# Patient Record
Sex: Female | Born: 1960 | Race: Black or African American | Hispanic: No | Marital: Single | Smoking: Current every day smoker
Health system: Southern US, Community
[De-identification: ages and names within clinical notes are randomized; demographics above are authoritative.]

## PROBLEM LIST (undated history)

## (undated) DIAGNOSIS — I679 Cerebrovascular disease, unspecified: Secondary | ICD-10-CM

## (undated) DIAGNOSIS — K219 Gastro-esophageal reflux disease without esophagitis: Secondary | ICD-10-CM

## (undated) DIAGNOSIS — F410 Panic disorder [episodic paroxysmal anxiety] without agoraphobia: Secondary | ICD-10-CM

## (undated) DIAGNOSIS — F329 Major depressive disorder, single episode, unspecified: Secondary | ICD-10-CM

## (undated) DIAGNOSIS — F17200 Nicotine dependence, unspecified, uncomplicated: Secondary | ICD-10-CM

## (undated) DIAGNOSIS — E669 Obesity, unspecified: Secondary | ICD-10-CM

## (undated) DIAGNOSIS — M549 Dorsalgia, unspecified: Secondary | ICD-10-CM

## (undated) DIAGNOSIS — R0600 Dyspnea, unspecified: Secondary | ICD-10-CM

## (undated) DIAGNOSIS — I10 Essential (primary) hypertension: Secondary | ICD-10-CM

## (undated) DIAGNOSIS — F319 Bipolar disorder, unspecified: Secondary | ICD-10-CM

## (undated) DIAGNOSIS — R002 Palpitations: Secondary | ICD-10-CM

## (undated) DIAGNOSIS — Z720 Tobacco use: Secondary | ICD-10-CM

## (undated) DIAGNOSIS — F32A Depression, unspecified: Secondary | ICD-10-CM

## (undated) DIAGNOSIS — E785 Hyperlipidemia, unspecified: Secondary | ICD-10-CM

## (undated) DIAGNOSIS — R079 Chest pain, unspecified: Secondary | ICD-10-CM

## (undated) HISTORY — DX: Hyperlipidemia, unspecified: E78.5

## (undated) HISTORY — DX: Essential (primary) hypertension: I10

## (undated) HISTORY — DX: Panic disorder (episodic paroxysmal anxiety): F41.0

## (undated) HISTORY — DX: Dyspnea, unspecified: R06.00

## (undated) HISTORY — DX: Chest pain, unspecified: R07.9

## (undated) HISTORY — DX: Dorsalgia, unspecified: M54.9

## (undated) HISTORY — DX: Major depressive disorder, single episode, unspecified: F32.9

## (undated) HISTORY — DX: Cerebrovascular disease, unspecified: I67.9

## (undated) HISTORY — DX: Palpitations: R00.2

## (undated) HISTORY — DX: Depression, unspecified: F32.A

## (undated) HISTORY — DX: Gastro-esophageal reflux disease without esophagitis: K21.9

## (undated) HISTORY — DX: Nicotine dependence, unspecified, uncomplicated: F17.200

## (undated) HISTORY — DX: Obesity, unspecified: E66.9

---

## 2000-12-01 HISTORY — PX: TOTAL ABDOMINAL HYSTERECTOMY: SHX209

## 2001-05-22 ENCOUNTER — Emergency Department (HOSPITAL_COMMUNITY): Admission: EM | Admit: 2001-05-22 | Discharge: 2001-05-22 | Payer: Self-pay | Admitting: Emergency Medicine

## 2002-01-16 ENCOUNTER — Emergency Department (HOSPITAL_COMMUNITY): Admission: EM | Admit: 2002-01-16 | Discharge: 2002-01-16 | Payer: Self-pay | Admitting: Emergency Medicine

## 2002-03-08 ENCOUNTER — Encounter (HOSPITAL_COMMUNITY): Admission: RE | Admit: 2002-03-08 | Discharge: 2002-04-07 | Payer: Self-pay | Admitting: Anesthesiology

## 2002-07-08 ENCOUNTER — Ambulatory Visit (HOSPITAL_COMMUNITY): Admission: RE | Admit: 2002-07-08 | Discharge: 2002-07-08 | Payer: Self-pay | Admitting: Family Medicine

## 2002-12-01 HISTORY — PX: CHOLECYSTECTOMY: SHX55

## 2003-06-06 ENCOUNTER — Encounter: Payer: Self-pay | Admitting: *Deleted

## 2003-06-06 ENCOUNTER — Emergency Department (HOSPITAL_COMMUNITY): Admission: EM | Admit: 2003-06-06 | Discharge: 2003-06-06 | Payer: Self-pay | Admitting: *Deleted

## 2003-08-24 ENCOUNTER — Ambulatory Visit (HOSPITAL_COMMUNITY): Admission: RE | Admit: 2003-08-24 | Discharge: 2003-08-24 | Payer: Self-pay | Admitting: Family Medicine

## 2003-08-24 ENCOUNTER — Encounter: Payer: Self-pay | Admitting: Family Medicine

## 2003-09-12 ENCOUNTER — Observation Stay (HOSPITAL_COMMUNITY): Admission: RE | Admit: 2003-09-12 | Discharge: 2003-09-13 | Payer: Self-pay | Admitting: General Surgery

## 2003-12-01 ENCOUNTER — Emergency Department (HOSPITAL_COMMUNITY): Admission: EM | Admit: 2003-12-01 | Discharge: 2003-12-01 | Payer: Self-pay | Admitting: Emergency Medicine

## 2004-10-15 ENCOUNTER — Ambulatory Visit: Payer: Self-pay | Admitting: Family Medicine

## 2004-11-22 ENCOUNTER — Ambulatory Visit: Payer: Self-pay | Admitting: Family Medicine

## 2004-12-27 ENCOUNTER — Ambulatory Visit: Payer: Self-pay | Admitting: Family Medicine

## 2005-01-27 ENCOUNTER — Ambulatory Visit: Payer: Self-pay | Admitting: Family Medicine

## 2005-02-25 ENCOUNTER — Ambulatory Visit: Payer: Self-pay | Admitting: Family Medicine

## 2005-03-31 ENCOUNTER — Ambulatory Visit: Payer: Self-pay | Admitting: Family Medicine

## 2005-05-01 ENCOUNTER — Ambulatory Visit: Payer: Self-pay | Admitting: Family Medicine

## 2005-05-05 ENCOUNTER — Ambulatory Visit (HOSPITAL_COMMUNITY): Admission: RE | Admit: 2005-05-05 | Discharge: 2005-05-05 | Payer: Self-pay | Admitting: Family Medicine

## 2005-06-04 ENCOUNTER — Ambulatory Visit: Payer: Self-pay | Admitting: Family Medicine

## 2005-07-31 ENCOUNTER — Ambulatory Visit: Payer: Self-pay | Admitting: Family Medicine

## 2005-10-30 ENCOUNTER — Ambulatory Visit: Payer: Self-pay | Admitting: Family Medicine

## 2005-12-02 ENCOUNTER — Ambulatory Visit: Payer: Self-pay | Admitting: Family Medicine

## 2005-12-31 ENCOUNTER — Ambulatory Visit: Payer: Self-pay | Admitting: Family Medicine

## 2006-01-13 ENCOUNTER — Ambulatory Visit: Payer: Self-pay | Admitting: Family Medicine

## 2006-01-15 ENCOUNTER — Ambulatory Visit (HOSPITAL_COMMUNITY): Admission: RE | Admit: 2006-01-15 | Discharge: 2006-01-15 | Payer: Self-pay | Admitting: Family Medicine

## 2006-02-25 ENCOUNTER — Ambulatory Visit: Payer: Self-pay | Admitting: Family Medicine

## 2006-03-31 ENCOUNTER — Ambulatory Visit: Payer: Self-pay | Admitting: Family Medicine

## 2006-07-02 ENCOUNTER — Ambulatory Visit: Payer: Self-pay | Admitting: Family Medicine

## 2006-07-30 ENCOUNTER — Ambulatory Visit: Payer: Self-pay | Admitting: Family Medicine

## 2006-09-03 ENCOUNTER — Ambulatory Visit: Payer: Self-pay | Admitting: Family Medicine

## 2006-10-09 ENCOUNTER — Ambulatory Visit: Payer: Self-pay | Admitting: Family Medicine

## 2006-12-01 DIAGNOSIS — R079 Chest pain, unspecified: Secondary | ICD-10-CM

## 2006-12-01 HISTORY — DX: Chest pain, unspecified: R07.9

## 2006-12-04 ENCOUNTER — Ambulatory Visit: Payer: Self-pay | Admitting: Family Medicine

## 2006-12-09 ENCOUNTER — Ambulatory Visit: Payer: Self-pay | Admitting: Cardiovascular Disease

## 2006-12-10 ENCOUNTER — Ambulatory Visit: Payer: Self-pay | Admitting: Cardiovascular Disease

## 2006-12-14 ENCOUNTER — Ambulatory Visit: Payer: Self-pay | Admitting: Cardiovascular Disease

## 2006-12-14 ENCOUNTER — Encounter (HOSPITAL_COMMUNITY): Admission: RE | Admit: 2006-12-14 | Discharge: 2007-01-13 | Payer: Self-pay | Admitting: Cardiovascular Disease

## 2007-01-08 ENCOUNTER — Ambulatory Visit: Payer: Self-pay | Admitting: Family Medicine

## 2007-02-03 ENCOUNTER — Ambulatory Visit: Payer: Self-pay | Admitting: Family Medicine

## 2007-02-03 LAB — CONVERTED CEMR LAB: Pap Smear: NORMAL

## 2007-03-08 ENCOUNTER — Encounter: Payer: Self-pay | Admitting: Family Medicine

## 2007-03-08 LAB — CONVERTED CEMR LAB
ALT: 24 units/L (ref 0–35)
AST: 19 units/L (ref 0–37)
Albumin: 4 g/dL (ref 3.5–5.2)
Alkaline Phosphatase: 113 units/L (ref 39–117)
BUN: 10 mg/dL (ref 6–23)
Bilirubin, Direct: <0.1 mg/dL (ref 0.0–0.3)
CO2: 24 meq/L (ref 19–32)
Calcium: 9.1 mg/dL (ref 8.4–10.5)
Chloride: 108 meq/L (ref 96–112)
Cholesterol: 243 mg/dL — ABNORMAL HIGH (ref 0–200)
Creatinine, Ser: 0.73 mg/dL (ref 0.40–1.20)
Glucose, Bld: 88 mg/dL (ref 70–99)
HDL: 32 mg/dL — ABNORMAL LOW (ref >39)
LDL Cholesterol: 143 mg/dL — ABNORMAL HIGH (ref 0–99)
Potassium: 4.5 meq/L (ref 3.5–5.3)
Sodium: 143 meq/L (ref 135–145)
TSH: 0.895 microintl units/mL (ref 0.350–5.50)
Total Bilirubin: 0.3 mg/dL (ref 0.3–1.2)
Total CHOL/HDL Ratio: 7.6
Total Protein: 7.1 g/dL (ref 6.0–8.3)
Triglycerides: 340 mg/dL — ABNORMAL HIGH (ref <150)
VLDL: 68 mg/dL — ABNORMAL HIGH (ref 0–40)

## 2007-03-10 ENCOUNTER — Ambulatory Visit: Payer: Self-pay | Admitting: Family Medicine

## 2007-04-07 ENCOUNTER — Ambulatory Visit: Payer: Self-pay | Admitting: Family Medicine

## 2007-05-06 ENCOUNTER — Ambulatory Visit: Payer: Self-pay | Admitting: Family Medicine

## 2007-06-07 ENCOUNTER — Ambulatory Visit: Payer: Self-pay | Admitting: Family Medicine

## 2007-08-09 ENCOUNTER — Ambulatory Visit: Payer: Self-pay | Admitting: Family Medicine

## 2007-10-08 ENCOUNTER — Ambulatory Visit: Payer: Self-pay | Admitting: Family Medicine

## 2007-11-30 ENCOUNTER — Ambulatory Visit: Payer: Self-pay | Admitting: Family Medicine

## 2007-12-02 ENCOUNTER — Encounter: Payer: Self-pay | Admitting: Family Medicine

## 2008-02-03 ENCOUNTER — Ambulatory Visit: Payer: Self-pay | Admitting: Family Medicine

## 2008-02-28 ENCOUNTER — Encounter: Payer: Self-pay | Admitting: Family Medicine

## 2008-02-28 LAB — CONVERTED CEMR LAB
BUN: 10 mg/dL (ref 6–23)
Basophils Absolute: 0.1 10*3/uL (ref 0.0–0.1)
Basophils Relative: 1 % (ref 0–1)
CO2: 19 meq/L (ref 19–32)
Calcium: 9.3 mg/dL (ref 8.4–10.5)
Chloride: 105 meq/L (ref 96–112)
Cholesterol: 269 mg/dL — ABNORMAL HIGH (ref 0–200)
Creatinine, Ser: 0.67 mg/dL (ref 0.40–1.20)
Eosinophils Absolute: 0.1 10*3/uL (ref 0.0–0.7)
Eosinophils Relative: 1 % (ref 0–5)
Glucose, Bld: 103 mg/dL — ABNORMAL HIGH (ref 70–99)
HCT: 43 % (ref 36.0–46.0)
HDL: 41 mg/dL (ref >39)
Hemoglobin: 15.2 g/dL — ABNORMAL HIGH (ref 12.0–15.0)
LDL Cholesterol: 180 mg/dL — ABNORMAL HIGH (ref 0–99)
Lymphocytes Relative: 25 % (ref 12–46)
Lymphs Abs: 1.8 10*3/uL (ref 0.7–4.0)
MCHC: 35.3 g/dL (ref 30.0–36.0)
MCV: 87.4 fL (ref 78.0–100.0)
Monocytes Absolute: 0.3 10*3/uL (ref 0.1–1.0)
Monocytes Relative: 4 % (ref 3–12)
Neutro Abs: 4.8 10*3/uL (ref 1.7–7.7)
Neutrophils Relative %: 69 % (ref 43–77)
Platelets: 242 10*3/uL (ref 150–400)
Potassium: 4.1 meq/L (ref 3.5–5.3)
RBC: 4.92 M/uL (ref 3.87–5.11)
RDW: 13.3 % (ref 11.5–15.5)
Sodium: 139 meq/L (ref 135–145)
TSH: 0.303 microintl units/mL — ABNORMAL LOW (ref 0.350–5.50)
Total CHOL/HDL Ratio: 6.6
Triglycerides: 242 mg/dL — ABNORMAL HIGH (ref <150)
VLDL: 48 mg/dL — ABNORMAL HIGH (ref 0–40)
WBC: 6.9 10*3/uL (ref 4.0–10.5)

## 2008-02-29 ENCOUNTER — Encounter: Payer: Self-pay | Admitting: Family Medicine

## 2008-02-29 LAB — CONVERTED CEMR LAB
ALT: 28 units/L (ref 0–35)
AST: 21 units/L (ref 0–37)
Albumin: 4.7 g/dL (ref 3.5–5.2)
Alkaline Phosphatase: 128 units/L — ABNORMAL HIGH (ref 39–117)
Bilirubin, Direct: <0.1 mg/dL (ref 0.0–0.3)
Total Bilirubin: 0.2 mg/dL — ABNORMAL LOW (ref 0.3–1.2)
Total Protein: 8.5 g/dL — ABNORMAL HIGH (ref 6.0–8.3)

## 2008-03-03 ENCOUNTER — Ambulatory Visit: Payer: Self-pay | Admitting: Family Medicine

## 2008-03-08 ENCOUNTER — Encounter: Payer: Self-pay | Admitting: Family Medicine

## 2008-03-08 DIAGNOSIS — F329 Major depressive disorder, single episode, unspecified: Secondary | ICD-10-CM

## 2008-03-08 DIAGNOSIS — I160 Hypertensive urgency: Secondary | ICD-10-CM | POA: Insufficient documentation

## 2008-03-08 DIAGNOSIS — F32A Depression, unspecified: Secondary | ICD-10-CM | POA: Insufficient documentation

## 2008-03-08 DIAGNOSIS — Z720 Tobacco use: Secondary | ICD-10-CM | POA: Insufficient documentation

## 2008-03-08 DIAGNOSIS — E785 Hyperlipidemia, unspecified: Secondary | ICD-10-CM

## 2008-03-08 DIAGNOSIS — F419 Anxiety disorder, unspecified: Secondary | ICD-10-CM | POA: Insufficient documentation

## 2008-03-08 DIAGNOSIS — F17200 Nicotine dependence, unspecified, uncomplicated: Secondary | ICD-10-CM

## 2008-03-08 DIAGNOSIS — I10 Essential (primary) hypertension: Secondary | ICD-10-CM | POA: Insufficient documentation

## 2008-03-08 DIAGNOSIS — E669 Obesity, unspecified: Secondary | ICD-10-CM

## 2008-06-30 ENCOUNTER — Telehealth: Payer: Self-pay | Admitting: Family Medicine

## 2008-08-03 ENCOUNTER — Ambulatory Visit: Payer: Self-pay | Admitting: Family Medicine

## 2008-08-08 ENCOUNTER — Encounter: Payer: Self-pay | Admitting: Family Medicine

## 2008-08-30 ENCOUNTER — Telehealth: Payer: Self-pay | Admitting: Family Medicine

## 2008-08-31 ENCOUNTER — Encounter: Payer: Self-pay | Admitting: Family Medicine

## 2008-09-28 ENCOUNTER — Telehealth: Payer: Self-pay | Admitting: Family Medicine

## 2008-09-29 ENCOUNTER — Encounter: Payer: Self-pay | Admitting: Family Medicine

## 2008-10-05 ENCOUNTER — Encounter: Payer: Self-pay | Admitting: Family Medicine

## 2008-10-20 ENCOUNTER — Encounter: Payer: Self-pay | Admitting: Family Medicine

## 2008-10-23 ENCOUNTER — Telehealth: Payer: Self-pay | Admitting: Family Medicine

## 2008-10-24 ENCOUNTER — Encounter: Payer: Self-pay | Admitting: Family Medicine

## 2008-11-13 ENCOUNTER — Encounter: Payer: Self-pay | Admitting: Family Medicine

## 2008-11-28 ENCOUNTER — Telehealth: Payer: Self-pay | Admitting: Family Medicine

## 2008-11-29 ENCOUNTER — Encounter: Payer: Self-pay | Admitting: Family Medicine

## 2008-12-29 ENCOUNTER — Ambulatory Visit: Payer: Self-pay | Admitting: Family Medicine

## 2009-01-24 ENCOUNTER — Encounter: Payer: Self-pay | Admitting: Family Medicine

## 2009-01-24 ENCOUNTER — Telehealth: Payer: Self-pay | Admitting: Family Medicine

## 2009-02-19 ENCOUNTER — Encounter: Payer: Self-pay | Admitting: Family Medicine

## 2009-02-23 ENCOUNTER — Encounter: Payer: Self-pay | Admitting: Family Medicine

## 2009-03-26 ENCOUNTER — Telehealth: Payer: Self-pay | Admitting: Family Medicine

## 2009-03-27 ENCOUNTER — Encounter: Payer: Self-pay | Admitting: Family Medicine

## 2009-04-26 ENCOUNTER — Encounter: Payer: Self-pay | Admitting: Family Medicine

## 2009-05-18 DIAGNOSIS — R0602 Shortness of breath: Secondary | ICD-10-CM | POA: Insufficient documentation

## 2009-05-18 DIAGNOSIS — R079 Chest pain, unspecified: Secondary | ICD-10-CM | POA: Insufficient documentation

## 2009-05-25 ENCOUNTER — Ambulatory Visit: Payer: Self-pay | Admitting: Family Medicine

## 2009-06-25 ENCOUNTER — Encounter: Payer: Self-pay | Admitting: Family Medicine

## 2009-07-26 ENCOUNTER — Telehealth: Payer: Self-pay | Admitting: Family Medicine

## 2009-08-23 ENCOUNTER — Encounter: Payer: Self-pay | Admitting: Family Medicine

## 2009-08-23 ENCOUNTER — Telehealth: Payer: Self-pay | Admitting: Family Medicine

## 2009-09-24 ENCOUNTER — Encounter: Payer: Self-pay | Admitting: Family Medicine

## 2009-10-31 ENCOUNTER — Encounter: Payer: Self-pay | Admitting: Family Medicine

## 2009-11-20 ENCOUNTER — Ambulatory Visit: Payer: Self-pay | Admitting: Family Medicine

## 2009-11-20 DIAGNOSIS — E049 Nontoxic goiter, unspecified: Secondary | ICD-10-CM | POA: Insufficient documentation

## 2009-11-27 ENCOUNTER — Telehealth: Payer: Self-pay | Admitting: Family Medicine

## 2009-12-19 ENCOUNTER — Encounter: Payer: Self-pay | Admitting: Family Medicine

## 2009-12-19 ENCOUNTER — Telehealth: Payer: Self-pay | Admitting: Family Medicine

## 2010-01-17 ENCOUNTER — Ambulatory Visit: Payer: Self-pay | Admitting: Family Medicine

## 2010-01-17 DIAGNOSIS — R0989 Other specified symptoms and signs involving the circulatory and respiratory systems: Secondary | ICD-10-CM

## 2010-02-05 ENCOUNTER — Telehealth: Payer: Self-pay | Admitting: Family Medicine

## 2010-02-12 ENCOUNTER — Encounter: Payer: Self-pay | Admitting: Family Medicine

## 2010-03-11 ENCOUNTER — Ambulatory Visit (HOSPITAL_COMMUNITY): Admission: RE | Admit: 2010-03-11 | Discharge: 2010-03-11 | Payer: Self-pay | Admitting: Family Medicine

## 2010-03-18 ENCOUNTER — Encounter: Payer: Self-pay | Admitting: Family Medicine

## 2010-04-16 ENCOUNTER — Ambulatory Visit: Payer: Self-pay | Admitting: Family Medicine

## 2010-05-15 ENCOUNTER — Encounter: Payer: Self-pay | Admitting: Family Medicine

## 2010-05-15 ENCOUNTER — Telehealth: Payer: Self-pay | Admitting: Family Medicine

## 2010-06-12 ENCOUNTER — Encounter: Payer: Self-pay | Admitting: Family Medicine

## 2010-08-09 ENCOUNTER — Telehealth: Payer: Self-pay | Admitting: Family Medicine

## 2010-08-12 ENCOUNTER — Encounter: Payer: Self-pay | Admitting: Family Medicine

## 2010-09-11 ENCOUNTER — Ambulatory Visit: Payer: Self-pay | Admitting: Family Medicine

## 2010-10-08 ENCOUNTER — Encounter: Payer: Self-pay | Admitting: Family Medicine

## 2010-10-09 ENCOUNTER — Telehealth: Payer: Self-pay | Admitting: Family Medicine

## 2010-10-09 ENCOUNTER — Encounter: Payer: Self-pay | Admitting: Family Medicine

## 2010-10-09 LAB — CONVERTED CEMR LAB
BUN: 10 mg/dL (ref 6–23)
Basophils Absolute: 0 10*3/uL (ref 0.0–0.1)
Basophils Relative: 0 % (ref 0–1)
CO2: 24 meq/L (ref 19–32)
Calcium: 9.7 mg/dL (ref 8.4–10.5)
Chloride: 106 meq/L (ref 96–112)
Cholesterol: 246 mg/dL — ABNORMAL HIGH (ref 0–200)
Creatinine, Ser: 0.76 mg/dL (ref 0.40–1.20)
Eosinophils Absolute: 0 10*3/uL (ref 0.0–0.7)
Eosinophils Relative: 0 % (ref 0–5)
Glucose, Bld: 123 mg/dL — ABNORMAL HIGH (ref 70–99)
HCT: 47.2 % — ABNORMAL HIGH (ref 36.0–46.0)
HDL: 46 mg/dL (ref >39)
Hemoglobin: 15.7 g/dL — ABNORMAL HIGH (ref 12.0–15.0)
LDL Cholesterol: 169 mg/dL — ABNORMAL HIGH (ref 0–99)
Lymphocytes Relative: 25 % (ref 12–46)
Lymphs Abs: 2 10*3/uL (ref 0.7–4.0)
MCHC: 33.3 g/dL (ref 30.0–36.0)
MCV: 92.5 fL (ref 78.0–100.0)
Monocytes Absolute: 0.3 10*3/uL (ref 0.1–1.0)
Monocytes Relative: 3 % (ref 3–12)
Neutro Abs: 5.8 10*3/uL (ref 1.7–7.7)
Neutrophils Relative %: 71 % (ref 43–77)
Platelets: 254 10*3/uL (ref 150–400)
Potassium: 3.8 meq/L (ref 3.5–5.3)
RBC: 5.1 M/uL (ref 3.87–5.11)
RDW: 13.6 % (ref 11.5–15.5)
Sodium: 143 meq/L (ref 135–145)
TSH: 0.253 microintl units/mL — ABNORMAL LOW (ref 0.350–4.500)
Total CHOL/HDL Ratio: 5.3
Triglycerides: 157 mg/dL — ABNORMAL HIGH (ref <150)
VLDL: 31 mg/dL (ref 0–40)
WBC: 8.1 10*3/uL (ref 4.0–10.5)

## 2010-10-10 ENCOUNTER — Encounter: Payer: Self-pay | Admitting: Family Medicine

## 2010-10-10 LAB — CONVERTED CEMR LAB
ALT: 15 units/L (ref 0–35)
AST: 16 units/L (ref 0–37)
Albumin: 4.7 g/dL (ref 3.5–5.2)
Alkaline Phosphatase: 113 units/L (ref 39–117)
Bilirubin, Direct: 0.1 mg/dL (ref 0.0–0.3)
Free T4: 0.98 ng/dL (ref 0.80–1.80)
Hgb A1c MFr Bld: 5.3 % (ref <5.7)
Indirect Bilirubin: 0.2 mg/dL (ref 0.0–0.9)
T3, Free: 2.8 pg/mL (ref 2.3–4.2)
Total Bilirubin: 0.3 mg/dL (ref 0.3–1.2)
Total Protein: 8.1 g/dL (ref 6.0–8.3)
Vit D, 25-Hydroxy: 21 ng/mL — ABNORMAL LOW (ref 30–89)

## 2010-10-14 ENCOUNTER — Encounter: Payer: Self-pay | Admitting: Family Medicine

## 2010-10-18 ENCOUNTER — Ambulatory Visit: Payer: Self-pay | Admitting: Family Medicine

## 2010-11-06 ENCOUNTER — Emergency Department (HOSPITAL_COMMUNITY)
Admission: EM | Admit: 2010-11-06 | Discharge: 2010-11-06 | Payer: Self-pay | Source: Home / Self Care | Admitting: Emergency Medicine

## 2010-11-07 ENCOUNTER — Telehealth: Payer: Self-pay | Admitting: Family Medicine

## 2010-11-07 ENCOUNTER — Telehealth (INDEPENDENT_AMBULATORY_CARE_PROVIDER_SITE_OTHER): Payer: Self-pay | Admitting: *Deleted

## 2010-11-07 ENCOUNTER — Encounter: Payer: Self-pay | Admitting: Family Medicine

## 2010-11-11 ENCOUNTER — Telehealth (INDEPENDENT_AMBULATORY_CARE_PROVIDER_SITE_OTHER): Payer: Self-pay | Admitting: *Deleted

## 2010-11-11 DIAGNOSIS — M5416 Radiculopathy, lumbar region: Secondary | ICD-10-CM | POA: Insufficient documentation

## 2010-11-11 DIAGNOSIS — IMO0002 Reserved for concepts with insufficient information to code with codable children: Secondary | ICD-10-CM

## 2010-11-19 ENCOUNTER — Ambulatory Visit (HOSPITAL_COMMUNITY)
Admission: RE | Admit: 2010-11-19 | Discharge: 2010-11-19 | Payer: Self-pay | Source: Home / Self Care | Attending: Family Medicine | Admitting: Family Medicine

## 2010-11-20 ENCOUNTER — Telehealth: Payer: Self-pay | Admitting: Family Medicine

## 2010-11-21 ENCOUNTER — Telehealth (INDEPENDENT_AMBULATORY_CARE_PROVIDER_SITE_OTHER): Payer: Self-pay | Admitting: *Deleted

## 2010-11-21 ENCOUNTER — Telehealth: Payer: Self-pay | Admitting: Family Medicine

## 2010-11-22 ENCOUNTER — Encounter: Payer: Self-pay | Admitting: Family Medicine

## 2010-11-26 ENCOUNTER — Telehealth: Payer: Self-pay | Admitting: Family Medicine

## 2010-12-06 ENCOUNTER — Encounter: Payer: Self-pay | Admitting: Family Medicine

## 2010-12-21 ENCOUNTER — Encounter: Payer: Self-pay | Admitting: Family Medicine

## 2010-12-22 ENCOUNTER — Encounter: Payer: Self-pay | Admitting: Family Medicine

## 2010-12-23 ENCOUNTER — Encounter: Payer: Self-pay | Admitting: Family Medicine

## 2010-12-25 ENCOUNTER — Telehealth: Payer: Self-pay | Admitting: Family Medicine

## 2010-12-31 NOTE — Medication Information (Signed)
Summary: Tax adviser   Imported By: Lind Guest 08/16/2010 12:54:02  _____________________________________________________________________  External Attachment:    Type:   Image     Comment:   External Document

## 2010-12-31 NOTE — Letter (Signed)
Summary: X RAYS  X RAYS   Imported By: Lind Guest 04/30/2010 16:49:12  _____________________________________________________________________  External Attachment:    Type:   Image     Comment:   External Document

## 2010-12-31 NOTE — Assessment & Plan Note (Signed)
Summary: OV   Vital Signs:  Patient profile:   50 year old female Menstrual status:  hysterectomy Height:      67 inches Weight:      218 pounds BMI:     34.27 O2 Sat:      97 % Pulse rate:   96 / minute Pulse rhythm:   regular Resp:     16 per minute BP sitting:   122 / 76  (left arm) Cuff size:   large  Vitals Entered By: Everitt Amber LPN (January 17, 2010 1:07 PM)  Nutrition Counseling: Patient's BMI is greater than 25 and therefore counseled on weight management options. CC: Follow up chronic problems, having bad anxiety   CC:  Follow up chronic problems and having bad anxiety.  History of Present Illness: c/op increased fatigue, palpitations, feels as though she will pass out, she hyperventilates, butterflies in her stomach. Has chest pain all the time states she takesalot of aspiri she is seeing the psychiatrist for panic and anxiety for uncontroled symptoms. she refuses an eKG and states  she does not want to go the carsdiologist at this time, states she is scared, she reports near syncopal episodes often and has questions about pAD.  Preventive Screening-Counseling & Management  Alcohol-Tobacco     Smoking Cessation Counseling: yes  Current Medications (verified): 1)  Lasix 20 Mg  Tabs (Furosemide) .... Take 1 Tablet By Mouth Once A Day 2)  Methadone Hcl 10 Mg  Tabs (Methadone Hcl) .... One Tab By Mouth Tid 3)  Crestor 20 Mg Tabs (Rosuvastatin Calcium) .... Take One Tab By Mouth At Bedtime 4)  Amlodipine Besylate 10 Mg Tabs (Amlodipine Besylate) .... Take One Tab By Mouth Once Daily 5)  Ativan 2 Mg Tabs (Lorazepam) .... One Tablet Half Hour Before Test, May Repeat After 15 Minutes If Needed  Allergies (verified): 1)  ! * Iv Dye  Review of Systems      See HPI General:  Complains of fatigue; denies chills and fever. ENT:  Complains of nasal congestion and postnasal drainage. CV:  Complains of chest pain or discomfort, palpitations, and shortness of breath with  exertion. Resp:  Complains of cough and sputum productive; brown sputum. GI:  Denies abdominal pain, constipation, diarrhea, nausea, and vomiting. GU:  Denies dysuria and urinary frequency. MS:  Complains of joint pain, low back pain, mid back pain, and muscle weakness. Psych:  Complains of easily tearful, mental problems, suicidal thoughts/plans, thoughts of violence, and unusual visions or sounds. Heme:  Denies abnormal bruising and bleeding. Allergy:  Denies hives or rash.  Physical Exam  General:  Well-developed,obese,in no acute distress; alert,appropriate and cooperative throughout examination. Anxious. HEENT: No facial asymmetry, goiter, and carotid bruit EOMI, No sinus tenderness, TM's Clear, oropharynx  pink and moist.   Chest: .Decreased throughout, few crackles CVS: S1, S2, No murmurs, No S3.   Abd: Soft, Nontender.  MS: decreased  ROM spine, hips, shoulders and knees.  Ext: No edema.   CNS: CN 2-12 intact, power tone and sensation normal throughout.   Skin: Intact, no visible lesions or rashes.  Psych: Good eye contact, normal affect.  Memory intact,    Impression & Recommendations:  Problem # 1:  CAROTID BRUIT (ICD-785.9) Assessment Comment Only  Future Orders: Radiology Referral (Radiology) ... 01/18/2010  Problem # 2:  GOITER, UNSPECIFIED (ICD-240.9) Assessment: Comment Only  Future Orders: Radiology Referral (Radiology) ... 01/18/2010  Problem # 3:  NICOTINE ADDICTION (ICD-305.1) Assessment: Unchanged  Encouraged smoking  cessation and discussed different methods for smoking cessation.   Problem # 4:  HYPERTENSION (ICD-401.9) Assessment: Improved  Her updated medication list for this problem includes:    Lasix 20 Mg Tabs (Furosemide) .Marland Kitchen... Take 1 tablet by mouth once a day    Amlodipine Besylate 10 Mg Tabs (Amlodipine besylate) .Marland Kitchen... Take one tab by mouth once daily  Orders: T-Basic Metabolic Panel [phone])  BP today: 122/76 Prior BP: 142/70  (11/20/2009)  Labs Reviewed: K+: 4.1 (02/28/2008) Creat: : 0.67 (02/28/2008)   Chol: 269 (02/28/2008)   HDL: 41 (02/28/2008)   LDL: 180 (02/28/2008)   TG: 242 (02/28/2008)  Problem # 5:  DEPRESSION (ICD-311) Assessment: Deteriorated  Her updated medication list for this problem includes:    Ativan 2 Mg Tabs (Lorazepam) ..... One tablet half hour before test, may repeat after 15 minutes if needed  Problem # 6:  BACK PAIN (ICD-724.5) Assessment: Unchanged  Her updated medication list for this problem includes:    Methadone Hcl 10 Mg Tabs (Methadone hcl) ..... One tab by mouth tid  Complete Medication List: 1)  Lasix 20 Mg Tabs (Furosemide) .... Take 1 tablet by mouth once a day 2)  Methadone Hcl 10 Mg Tabs (Methadone hcl) .... One tab by mouth tid 3)  Crestor 20 Mg Tabs (Rosuvastatin calcium) .... Take one tab by mouth at bedtime 4)  Amlodipine Besylate 10 Mg Tabs (Amlodipine besylate) .... Take one tab by mouth once daily 5)  Ativan 2 Mg Tabs (Lorazepam) .... One tablet half hour before test, may repeat after 15 minutes if needed  Other Orders: T-Lipid Profile ([id number]) T-CBC w/Diff ([id number]) T-TSH [phone])  Patient Instructions: 1)  Please schedule a follow-up appointment in 2 months. 2)  Tobacco is very bad for your health and your loved ones! You Should stop smoking!. 3)  Stop Smoking Tips: Choose a Quit date. Cut down before the Quit date. decide what you will do as a substitute when you feel the urge to smoke(gum,toothpick,exercise). 4)  It is important that you exercise regularly at least 20 minutes 5 times a week. If you develop chest pain, have severe difficulty breathing, or feel very tired , stop exercising immediately and seek medical attention. 5)  You need to lose weight. Consider a lower calorie diet and regular exercise.  6)  BMP prior to visit, ICD-9: 7)  Lipid Panel prior to visit, ICD-9:  fasting labs pls do bu this Saturday preferably 8)  TSH prior  to visit, ICD-9: 9)  you  will be referred for a thyroid ultrasound and carotid doppler study 10)  CBC w/ Diff prior to visit, ICD-9: Prescriptions: METHADONE HCL 10 MG  TABS (METHADONE HCL) one tab by mouth tid  #90 x 0   Entered by:   Everitt Amber LPN   Authorized by:   Syliva Overman MD   Signed by:   Syliva Overman MD on 01/17/2010   Method used:   Handwritten   RxID:   [id number] AMLODIPINE BESYLATE 10 MG TABS (AMLODIPINE BESYLATE) take one tab by mouth once daily  #30 Each x 5   Entered by:   Everitt Amber LPN   Authorized by:   Syliva Overman MD   Signed by:   Everitt Amber LPN on 66/44/0347   Method used:   Electronically to        [facility] (retail)       [address]  [place], [place]  [address]       Ph: [phone]       Fax: [phone]   RxID:   [id number]

## 2010-12-31 NOTE — Progress Notes (Signed)
Summary: speak nurse  Phone Note Call from Patient   Summary of Call: pt needs to speak with nurse about being stressed out about referrals and life in general. [phone] Initial call taken by: Rudene Anda,  February 05, 2010 11:24 AM  Follow-up for Phone Call        patient states she is stressed out, advised patient she is more than welcome to come in for ov with our PA to discuss her depression and anxiety. patient declines appt but states she will keep it in mind. Follow-up by: Adella Hare LPN,  February 05, 2010 11:44 AM

## 2010-12-31 NOTE — Miscellaneous (Signed)
Summary: NARC REFILL  Clinical Lists Changes  Medications: Rx of METHADONE HCL 10 MG  TABS (METHADONE HCL) one tab by mouth tid;  #90 x 0;  Signed;  Entered by: Everitt Amber LPN;  Authorized by: Syliva Overman MD;  Method used: Handwritten    Prescriptions: METHADONE HCL 10 MG  TABS (METHADONE HCL) one tab by mouth tid  #90 x 0   Entered by:   Everitt Amber LPN   Authorized by:   Syliva Overman MD   Signed by:   Everitt Amber LPN on 16/09/9603   Method used:   Handwritten   RxID:   [id number]

## 2010-12-31 NOTE — Progress Notes (Signed)
Summary: rx  Phone Note Call from Patient   Summary of Call: pt cancelled appt for today. said they found her friend dead. but stated she would be here to get rx on monday. pt did reschedule for friday of next week.  Initial call taken by: Rudene Anda,  August 09, 2010 8:04 AM  Follow-up for Phone Call        Noted Follow-up by: Everitt Amber LPN,  August 09, 2010 9:11 AM

## 2010-12-31 NOTE — Progress Notes (Signed)
Summary: MEDICATION  Phone Note Call from Patient   Summary of Call: PATIENT WANTED KNOW IF SHE COULD SEND SOMEONE ELSE TO PICK UP RX TODAY FOR METHODONE HCL 10 MG IF YOU NEED TO TALK WITH PATIENT PLEASE CALL [phone]. PATIENT REQUEST TO PUT DOB AND ADDRESS ON PERSCRIPTION WHEN PICKED UP  Initial call taken by: Eugenio Hoes,  November 07, 2010 8:15 AM  Follow-up for Phone Call        med was picked up Follow-up by: Adella Hare LPN,  November 07, 2010 9:00 AM

## 2010-12-31 NOTE — Letter (Signed)
Summary: misc  misc   Imported By: Lind Guest 04/30/2010 16:20:08  _____________________________________________________________________  External Attachment:    Type:   Image     Comment:   External Document

## 2010-12-31 NOTE — Miscellaneous (Signed)
Summary: narc refill  Clinical Lists Changes  Medications: Rx of METHADONE HCL 10 MG  TABS (METHADONE HCL) one tab by mouth tid;  #90 x 0;  Signed;  Entered by: Adella Hare LPN;  Authorized by: Syliva Overman MD;  Method used: Handwritten    Prescriptions: METHADONE HCL 10 MG  TABS (METHADONE HCL) one tab by mouth tid  #90 x 0   Entered by:   Adella Hare LPN   Authorized by:   Syliva Overman MD   Signed by:   Adella Hare LPN on 91/47/8295   Method used:   Handwritten   RxID:   [id number]   Appended Document: narc refill this was duplicated in error

## 2010-12-31 NOTE — Miscellaneous (Signed)
Summary: narc refill  Clinical Lists Changes  Medications: Rx of METHADONE HCL 10 MG  TABS (METHADONE HCL) one tab by mouth tid;  #90 x 0;  Signed;  Entered by: Adella Hare LPN;  Authorized by: Syliva Overman MD;  Method used: Handwritten    Prescriptions: METHADONE HCL 10 MG  TABS (METHADONE HCL) one tab by mouth tid  #90 x 0   Entered by:   Adella Hare LPN   Authorized by:   Syliva Overman MD   Signed by:   Adella Hare LPN on 16/09/9603   Method used:   Handwritten   RxID:   [id number]

## 2010-12-31 NOTE — Letter (Signed)
Summary: OFFICE NOTES  OFFICE NOTES   Imported By: Lind Guest 04/30/2010 16:52:40  _____________________________________________________________________  External Attachment:    Type:   Image     Comment:   External Document

## 2010-12-31 NOTE — Medication Information (Signed)
Summary: Tax adviser   Imported By: Lind Guest 03/20/2010 12:56:21  _____________________________________________________________________  External Attachment:    Type:   Image     Comment:   External Document

## 2010-12-31 NOTE — Letter (Signed)
Summary: LAB AD ON  LAB AD ON   Imported By: Lind Guest 10/10/2010 09:02:00  _____________________________________________________________________  External Attachment:    Type:   Image     Comment:   External Document

## 2010-12-31 NOTE — Medication Information (Signed)
Summary: Tax adviser   Imported By: Lind Guest 10/03/2010 10:50:44  _____________________________________________________________________  External Attachment:    Type:   Image     Comment:   External Document

## 2010-12-31 NOTE — Miscellaneous (Signed)
Summary: NARC REFILL  Clinical Lists Changes  Medications: Rx of METHADONE HCL 10 MG  TABS (METHADONE HCL) one tab by mouth tid;  #90 x 0;  Signed;  Entered by: Everitt Amber LPN;  Authorized by: Syliva Overman MD;  Method used: Handwritten    Prescriptions: METHADONE HCL 10 MG  TABS (METHADONE HCL) one tab by mouth tid  #90 x 0   Entered by:   Everitt Amber LPN   Authorized by:   Syliva Overman MD   Signed by:   Everitt Amber LPN on 62/13/0865   Method used:   Handwritten   RxID:   [id number]

## 2010-12-31 NOTE — Progress Notes (Signed)
  Phone Note Call from Patient   Caller: Patient Summary of Call: states whole left leg is hurting and some numbness was seen in ER yesterday, states she is in severe pain advised ER, but patient states they did not do any xrays or tests and she was there all day, only gave her pain meds wants to know whats wrong we have no openings, can we order x ray or should patient go to urgent care or ER  Initial call taken by: Adella Hare LPN,  November 07, 2010 2:13 PM  Follow-up for Phone Call        patient called back this morning complain in extreme leg pain and numbness in her toes, advised to go to ER Follow-up by: Adella Hare LPN,  November 08, 2010 8:37 AM

## 2010-12-31 NOTE — Medication Information (Signed)
Summary: Tax adviser   Imported By: Lind Guest 10/14/2010 08:37:28  _____________________________________________________________________  External Attachment:    Type:   Image     Comment:   External Document

## 2010-12-31 NOTE — Miscellaneous (Signed)
Summary: NARC REFILL  Clinical Lists Changes  Medications: Rx of METHADONE HCL 10 MG  TABS (METHADONE HCL) one tab by mouth tid;  #90 x 0;  Signed;  Entered by: Everitt Amber LPN;  Authorized by: Syliva Overman MD;  Method used: Handwritten    Prescriptions: METHADONE HCL 10 MG  TABS (METHADONE HCL) one tab by mouth tid  #90 x 0   Entered by:   Everitt Amber LPN   Authorized by:   Syliva Overman MD   Signed by:   Everitt Amber LPN on 81/19/1478   Method used:   Handwritten   RxID:   [id number]

## 2010-12-31 NOTE — Miscellaneous (Signed)
Summary: NARC REFILL  Clinical Lists Changes  Medications: Rx of METHADONE HCL 10 MG  TABS (METHADONE HCL) one tab by mouth tid;  #90 x 0;  Signed;  Entered by: Everitt Amber LPN;  Authorized by: Syliva Overman MD;  Method used: Handwritten    Prescriptions: METHADONE HCL 10 MG  TABS (METHADONE HCL) one tab by mouth tid  #90 x 0   Entered by:   Everitt Amber LPN   Authorized by:   Syliva Overman MD   Signed by:   Everitt Amber LPN on 53/66/4403   Method used:   Handwritten   RxID:   [id number]

## 2010-12-31 NOTE — Medication Information (Signed)
Summary: Tax adviser   Imported By: Lind Guest 11/07/2010 12:01:34  _____________________________________________________________________  External Attachment:    Type:   Image     Comment:   External Document

## 2010-12-31 NOTE — Progress Notes (Signed)
Summary: written rx  Phone Note Call from Patient   Summary of Call: wants to come up here tomorrow and pick up a written rx. [phone] Initial call taken by: Rudene Anda,  December 19, 2009 2:26 PM  Follow-up for Phone Call        Phone Call Completed Follow-up by: Worthy Keeler LPN,  December 19, 2009 2:58 PM

## 2010-12-31 NOTE — Letter (Signed)
Summary: HISTORY AND PHYSICAL  HISTORY AND PHYSICAL   Imported By: Lind Guest 04/30/2010 16:48:44  _____________________________________________________________________  External Attachment:    Type:   Image     Comment:   External Document

## 2010-12-31 NOTE — Miscellaneous (Signed)
Summary: NARC REFILL  Clinical Lists Changes  Medications: Rx of METHADONE HCL 10 MG  TABS (METHADONE HCL) one tab by mouth tid;  #90 x 0;  Signed;  Entered by: Everitt Amber;  Authorized by: Syliva Overman MD;  Method used: Handwritten    Prescriptions: METHADONE HCL 10 MG  TABS (METHADONE HCL) one tab by mouth tid  #90 x 0   Entered by:   Everitt Amber   Authorized by:   Syliva Overman MD   Signed by:   Everitt Amber on 12/19/2009   Method used:   Handwritten   RxID:   [id number]

## 2010-12-31 NOTE — Assessment & Plan Note (Signed)
Summary: follow up- results- room 1   Vital Signs:  Patient profile:   50 year old female Menstrual status:  hysterectomy Height:      67 inches Weight:      209 pounds BMI:     32.85 O2 Sat:      99 % on Room air Pulse rate:   94 / minute Resp:     16 per minute BP sitting:   160 / 90  (left arm)  Vitals Entered By: Adella Hare LPN (Apr 16, 2010 10:49 AM) CC: follow up/ results Is Patient Diabetic? No Comments did not bring meds to ov   CC:  follow up/ results.  History of Present Illness: Pt is here today for refill of her BP meds and Methadone.  She states she suffers with "bad depression and anxiety."  She does see a psychiatrist.  States no change in her mood, just ongoing issue.  Does get heart racing.  when she takes her medications it goes away.  Seems to come when her anxiety is worse.  She admits to suicidal thoughts off & on, but no plans.  She saw her psychiatrist 2 weeks ago.  Has prescription for new medicine but hasn't filled it yet. Plans to pick this up today & start it.  Chronic back pain.  No change.  Chronic pain in bilat LE.  Also Chronic tingling "pins and needles" in her fingers.  Throid goiter.  Recently had thyroid US.  Pt has not had labs yet that were ordered in Feb.   Allergies: 1)  ! * Iv Dye  Past History:  Past medical history reviewed for relevance to current acute and chronic problems.  Past Medical History: Reviewed history from 05/18/2009 and no changes required. CHEST PAIN-UNSPECIFIED (ICD-786.50) normal echo and myovue 12/2006 DYSPNEA (ICD-786.05) NICOTINE ADDICTION (ICD-305.1) BACK PAIN (ICD-724.5) DEPRESSION (ICD-311) OBESITY (ICD-278.00) HYPERLIPIDEMIA (ICD-272.4) HYPERTENSION (ICD-401.9) Panic attacks  Review of Systems General:  Complains of fatigue. CV:  Complains of chest pain or discomfort and palpitations. Resp:  Complains of shortness of breath; denies cough. MS:  Complains of joint pain, low back pain, mid back  pain, muscle aches, and muscle weakness; denies joint swelling. Neuro:  Complains of headaches, numbness, and tremors. Psych:  Complains of anxiety, depression, easily tearful, mental problems, panic attacks, and suicidal thoughts/plans; denies thoughts of violence.  Physical Exam  General:  Well-developed,well-nourished,in no acute distress; alert,appropriate and cooperative throughout examination Head:  Normocephalic and atraumatic without obvious abnormalities. No apparent alopecia or balding. Ears:  External ear exam shows no significant lesions or deformities.  Otoscopic examination reveals clear canals, tympanic membranes are intact bilaterally without bulging, retraction, inflammation or discharge. Hearing is grossly normal bilaterally. Nose:  External nasal examination shows no deformity or inflammation. Nasal mucosa are pink and moist without lesions or exudates. Mouth:  Oral mucosa and oropharynx without lesions or exudates.  Neck:  No deformities, masses, or tenderness noted. Lungs:  Normal respiratory effort, chest expands symmetrically. Lungs are clear to auscultation, no crackles or wheezes. Heart:  Normal rate and regular rhythm. S1 and S2 normal without gallop, murmur, click, rub or other extra sounds. Msk:  Pt reports tenderness to palp entire thoracic and lumbar back. Pulses:  R radial normal and L radial normal.   Extremities:  No PTE. Neurologic:  alert & oriented X3, strength normal in all extremities, gait normal, and DTRs symmetrical and normal.   Cervical Nodes:  No lymphadenopathy noted Psych:  Oriented X3, good eye  contact, depressed affect, tearful, and moderately anxious.     Impression & Recommendations:  Problem # 1:  BACK PAIN (ICD-724.5) Assessment Unchanged Chronic.  No change.  Her updated medication list for this problem includes:    Methadone Hcl 10 Mg Tabs (Methadone hcl) ..... One tab by mouth tid  Problem # 2:  DEPRESSION (ICD-311) Assessment:  Unchanged Pt to continue f/u with psychiatrist. Encouraged pt to start new med prescribed by psych.  Her updated medication list for this problem includes:    Ativan 2 Mg Tabs (Lorazepam) ..... One tablet half hour before test, may repeat after 15 minutes if needed  Problem # 3:  GOITER, UNSPECIFIED (ICD-240.9) Assessment: Comment Only Encouraged pt to have labs done as prev ordered.  Problem # 4:  HYPERTENSION (ICD-401.9) Assessment: Deteriorated  Her updated medication list for this problem includes:    Lasix 20 Mg Tabs (Furosemide) .Marland Kitchen... Take 1 tablet by mouth once a day    Amlodipine Besylate 10 Mg Tabs (Amlodipine besylate) .Marland Kitchen... Take one tab by mouth once daily  BP today: 160/90 Prior BP: 122/76 (01/17/2010)  Labs Reviewed: K+: 4.1 (02/28/2008) Creat: : 0.67 (02/28/2008)   Chol: 269 (02/28/2008)   HDL: 41 (02/28/2008)   LDL: 180 (02/28/2008)   TG: 242 (02/28/2008)  Complete Medication List: 1)  Lasix 20 Mg Tabs (Furosemide) .... Take 1 tablet by mouth once a day 2)  Methadone Hcl 10 Mg Tabs (Methadone hcl) .... One tab by mouth tid 3)  Crestor 20 Mg Tabs (Rosuvastatin calcium) .... Take one tab by mouth at bedtime 4)  Amlodipine Besylate 10 Mg Tabs (Amlodipine besylate) .... Take one tab by mouth once daily 5)  Ativan 2 Mg Tabs (Lorazepam) .... One tablet half hour before test, may repeat after 15 minutes if needed  Patient Instructions: 1)  Please schedule a follow-up appointment in 2 months. 2)  Tobacco is very bad for your health and your loved ones! You Should stop smoking!. 3)  Stop Smoking Tips: Choose a Quit date. Cut down before the Quit date. decide what you will do as a substitute when you feel the urge to smoke(gum,toothpick,exercise). 4)  Continue seeing your psychiatrist. 5)  Please get your lab work done soon! Prescriptions: AMLODIPINE BESYLATE 10 MG TABS (AMLODIPINE BESYLATE) take one tab by mouth once daily  #30 Each x 5   Entered and Authorized by:    Esperanza Sheets PA   Signed by:   Esperanza Sheets PA on 04/16/2010   Method used:   Electronically to        [facility] (retail)       [address]       [place], [place]  [address]       Ph: [phone]       Fax: [phone]   RxID:   [id number]

## 2010-12-31 NOTE — Letter (Signed)
Summary: misc  misc   Imported By: Lind Guest 04/30/2010 16:22:30  _____________________________________________________________________  External Attachment:    Type:   Image     Comment:   External Document

## 2010-12-31 NOTE — Letter (Signed)
Summary: CONSULTS  CONSULTS   Imported By: Lind Guest 04/30/2010 16:47:45  _____________________________________________________________________  External Attachment:    Type:   Image     Comment:   External Document

## 2010-12-31 NOTE — Progress Notes (Signed)
Summary: rx  Phone Note Call from Patient   Summary of Call: pt wants to know if pain med rx is ready for her to pick up. [phone] Initial call taken by: Rudene Anda,  May 15, 2010 10:42 AM  Follow-up for Phone Call        patient aware Follow-up by: Adella Hare LPN,  May 15, 2010 10:52 AM

## 2010-12-31 NOTE — Letter (Signed)
Summary: demo  demo   Imported By: Lind Guest 04/30/2010 16:19:39  _____________________________________________________________________  External Attachment:    Type:   Image     Comment:   External Document

## 2010-12-31 NOTE — Assessment & Plan Note (Signed)
Summary: office visit   Vital Signs:  Patient profile:   50 year old female Menstrual status:  hysterectomy Height:      67 inches Weight:      199.75 pounds BMI:     31.40 O2 Sat:      99 % Pulse rate:   108 / minute Resp:     18 per minute BP sitting:   138 / 84  (left arm) Cuff size:   large  Vitals Entered By: Everitt Amber LPN  CC: Follow up visit   CC:  Follow up visit.  History of Present Illness: Pt presents today for check up. See's psychiatrist for depression, anxiety and panic attacks.  Pt states her anxiety is still bad.  She doesnt like leaving the house.    Pt has not had labs drawn that were ordered in Feb.  Needs thyoid labs to f/u thyroid goiter.  [place] 4-11 showed small nodules.  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 Pt states she is trying to cut back on smoking.  Is down to approx 6 cigs per day.     Current Medications (verified): 1)  Methadone Hcl 10 Mg  Tabs (Methadone Hcl) .... One Tab By Mouth Tid 2)  Amlodipine Besylate 10 Mg Tabs (Amlodipine Besylate) .... Take One Tab By Mouth Once Daily  Allergies (verified): 1)  ! * Iv Dye  Past History:  Past medical history reviewed for relevance to current acute and chronic problems.  Past Medical History: Reviewed history from 05/18/2009 and no changes required. CHEST PAIN-UNSPECIFIED (ICD-786.50) normal echo and myovue 12/2006 DYSPNEA (ICD-786.05) NICOTINE ADDICTION (ICD-305.1) BACK PAIN (ICD-724.5) DEPRESSION (ICD-311) OBESITY (ICD-278.00) HYPERLIPIDEMIA (ICD-272.4) HYPERTENSION (ICD-401.9) Panic attacks  Review of Systems General:  Denies chills and fever. ENT:  Denies earache, nasal congestion, and sore throat. CV:  Complains of palpitations; denies chest pain or discomfort; HEART RACING WITH ANXIETY. Resp:  Denies cough and shortness of breath. MS:  Complains of low back pain. Psych:  Complains of anxiety, depression, easily tearful, mental problems, and panic  attacks.  Physical Exam  General:  alert, well-developed, well-nourished, well-hydrated, and appropriate dress.   Head:  Normocephalic and atraumatic without obvious abnormalities. No apparent alopecia or balding. Ears:  External ear exam shows no significant lesions or deformities.  Otoscopic examination reveals clear canals, tympanic membranes are intact bilaterally without bulging, retraction, inflammation or discharge. Hearing is grossly normal bilaterally. Nose:  External nasal examination shows no deformity or inflammation. Nasal mucosa are pink and moist without lesions or exudates. Mouth:  Oral mucosa and oropharynx without lesions or exudates.  Neck:  No deformities, masses, or tenderness noted. Lungs:  Normal respiratory effort, chest expands symmetrically. Lungs are clear to auscultation, no crackles or wheezes. Heart:  Normal rate and regular rhythm. S1 and S2 normal without gallop, murmur, click, rub or other extra sounds. Cervical Nodes:  No lymphadenopathy noted Psych:  memory intact for recent and remote and moderately anxious.     Impression & Recommendations:  Problem # 1:  BACK PAIN (ICD-724.5) Assessment Comment Only  Her updated medication list for this problem includes:    Methadone Hcl 10 Mg Tabs (Methadone hcl) ..... One tab by mouth tid  Problem # 2:  GOITER, UNSPECIFIED (ICD-240.9) Assessment: Comment Only Again encouraged pt to have labs drawn.  She was given another copy of her lab order.  Problem # 3:  HYPERTENSION (ICD-401.9) Assessment: Improved  The following medications were removed from the medication  list:    Lasix 20 Mg Tabs (Furosemide) .Marland Kitchen... Take 1 tablet by mouth once a day Her updated medication list for this problem includes:    Amlodipine Besylate 10 Mg Tabs (Amlodipine besylate) .Marland Kitchen... Take one tab by mouth once daily  BP today: 138/84 Prior BP: 160/90 (04/16/2010)  Labs Reviewed: K+: 4.1 (02/28/2008) Creat: : 0.67 (02/28/2008)    Chol: 269 (02/28/2008)   HDL: 41 (02/28/2008)   LDL: 180 (02/28/2008)   TG: 242 (02/28/2008)  Problem # 4:  DEPRESSION (ICD-311) Assessment: Comment Only To continue f/u with psychiatrist.  The following medications were removed from the medication list:    Ativan 2 Mg Tabs (Lorazepam) ..... One tablet half hour before test, may repeat after 15 minutes if needed Her updated medication list for this problem includes:    Citalopram Hydrobromide 40 Mg Tabs (Citalopram hydrobromide) .Marland Kitchen... Take one two times a day    Alprazolam 2 Mg Tabs (Alprazolam) .Marland Kitchen... Take one tab three times a day  Complete Medication List: 1)  Methadone Hcl 10 Mg Tabs (Methadone hcl) .... One tab by mouth tid 2)  Amlodipine Besylate 10 Mg Tabs (Amlodipine besylate) .... Take one tab by mouth once daily 3)  Citalopram Hydrobromide 40 Mg Tabs (Citalopram hydrobromide) .... Take one two times a day 4)  Abilify 30 Mg Tabs (Aripiprazole) .... Take one every morning 5)  Lithium Carbonate 450 Mg Cr-tabs (Lithium carbonate) .... Take one at bedtime 6)  Alprazolam 2 Mg Tabs (Alprazolam) .... Take one tab three times a day  Patient Instructions: 1)  Please schedule a follow-up appointment in 1 month. 2)  Have blood work drawn fasting. 3)  Continue seeing your psychiatrist. Prescriptions: METHADONE HCL 10 MG  TABS (METHADONE HCL) one tab by mouth tid  #90 x 0   Entered by:   Everitt Amber LPN   Authorized by:   Syliva Overman MD   Signed by:   Everitt Amber LPN on 81/19/1478   Method used:   Printed then faxed to ...       [facility] (retail)       [address]       [place], [place]  [address]       Ph: [phone]       Fax: [phone]   RxID:   [id number]  Pt refused flu vaccine today.

## 2010-12-31 NOTE — Progress Notes (Signed)
  Phone Note Call from Patient   Caller: Patient Summary of Call: wants pain med states appt is the 18th and had bloodwork as you requested Initial call taken by: Adella Hare LPN,  October 09, 2010 11:51 AM  Follow-up for Phone Call        I noted the blood work, ps explain she needs to keep her appt since the labs have to be addressed, I will have a script ready for her this pm Follow-up by: Syliva Overman MD,  October 09, 2010 12:31 PM  Additional Follow-up for Phone Call Additional follow up Details #1::        Phone Call Completed Additional Follow-up by: Adella Hare LPN,  October 09, 2010 1:11 PM

## 2010-12-31 NOTE — Assessment & Plan Note (Signed)
Summary: F UP   Vital Signs:  Patient profile:   50 year old female Menstrual status:  hysterectomy Height:      67 inches Weight:      208.50 pounds BMI:     32.77 O2 Sat:      90 % on Room air Pulse rate:   80 / minute Pulse rhythm:   regular Resp:     16 per minute BP sitting:   130 / 70  (left arm)  Vitals Entered By: Adella Hare LPN (October 18, 2010 11:15 AM)  Nutrition Counseling: Patient's BMI is greater than 25 and therefore counseled on weight management options.  O2 Flow:  Room air CC: follow-up visit Is Patient Diabetic? No   CC:  follow-up visit.  History of Present Illness: Reports  that thshe has not been doing well. Anxiety, depression, panic disorder are all uncontrolled.she experiences palpitations, social withdrawal and does not wantto be around people.she sees her psychiatrist regularly. Her nicotineuse isworse. Denies recent fever or chills. Denies sinus pressure, nasal congestion , ear pain or sore throat. Denies chest congestion, or cough productive of sputum. Denies chest pain, , PND, orthopnea or leg swelling. Denies abdominal pain, nausea, vomitting, diarrhea or constipation. Denies change in bowel movements or bloody stool. Denies dysuria , frequency, incontinence or hesitancy. Denies  joint pain, swelling, or reduced mobility. Denies headaches, vertigo, seizures.  Denies  rash, lesions, or itch.     Preventive Screening-Counseling & Management  Alcohol-Tobacco     Smoking Cessation Counseling: yes  Current Medications (verified): 1)  Methadone Hcl 10 Mg  Tabs (Methadone Hcl) .... One Tab By Mouth Tid 2)  Amlodipine Besylate 10 Mg Tabs (Amlodipine Besylate) .... Take One Tab By Mouth Once Daily 3)  Lithium Carbonate 450 Mg Cr-Tabs (Lithium Carbonate) .... Take One At Bedtime 4)  Alprazolam 2 Mg Tabs (Alprazolam) .... Take One Tab Three Times A Day 5)  Ambien 10 Mg Tabs (Zolpidem Tartrate) .... One Tab By Mouth At Bedtime As  Needed 6)  Viibryd 40 Mg Tabs (Vilazodone Hcl) .... One Tab By Mouth Once Daily  Allergies (verified): 1)  ! * Iv Dye  Review of Systems      See HPI General:  Complains of fatigue, malaise, and weakness. Eyes:  Denies discharge and red eye. CV:  Complains of palpitations. MS:  Complains of low back pain, mid back pain, and thoracic pain. Psych:  Complains of anxiety, depression, easily angered, easily tearful, irritability, mental problems, and unusual visions or sounds; denies suicidal thoughts/plans and thoughts of violence. Endo:  Denies cold intolerance, excessive hunger, excessive thirst, and excessive urination. Heme:  Denies abnormal bruising and bleeding. Allergy:  Denies hives or rash.  Physical Exam  General:  Well-developedobesein no acute distress; alert,appropriate and cooperative throughout examination HEENT: No facial asymmetry,  EOMI, No sinus tenderness, TM's Clear, oropharynx  pink and moist.   Chest: Clear to auscultation bilaterally. Decreased air entry bilaterally CVS: S1, S2, No murmurs, No S3.   Abd: Soft, Nontender.  MS: decreased ROM spine, hips, shoulders and knees.  Ext: No edema.   CNS: CN 2-12 intact, power tone and sensation normal throughout.   Skin: Intact, no visible lesions or rashes.  Psych: Good eye contact, normal affect.  Memory loss, both depressed and anxious appearing    Impression & Recommendations:  Problem # 1:  NICOTINE ADDICTION (ICD-305.1) Assessment Deteriorated  Encouraged smoking cessation and discussed different methods for smoking cessation.   Problem # 2:  DEPRESSION (ICD-311) Assessment: Unchanged  The following medications were removed from the medication list:    Citalopram Hydrobromide 40 Mg Tabs (Citalopram hydrobromide) .Marland Kitchen... Take one two times a day Her updated medication list for this problem includes:    Alprazolam 2 Mg Tabs (Alprazolam) .Marland Kitchen... Take one tab three times a day    Viibryd 40 Mg Tabs (Vilazodone  hcl) ..... One tab by mouth once daily  Problem # 3:  OBESITY (ICD-278.00) Assessment: Unchanged  Ht: 67 (10/18/2010)   Wt: 208.50 (10/18/2010)   BMI: 32.77 (10/18/2010) therapeutic lifestyle change discussed and encouraged  Problem # 4:  BACK PAIN (ICD-724.5) Assessment: Unchanged  Her updated medication list for this problem includes:    Methadone Hcl 10 Mg Tabs (Methadone hcl) ..... One tab by mouth tid  Problem # 5:  HYPERTENSION (ICD-401.9) Assessment: Unchanged  Her updated medication list for this problem includes:    Amlodipine Besylate 10 Mg Tabs (Amlodipine besylate) .Marland Kitchen... Take one tab by mouth once daily  Orders: T-Basic Metabolic Panel [phone])  BP today: 130/70 Prior BP: 138/84 (09/11/2010)  Labs Reviewed: K+: 3.8 (10/08/2010) Creat: : 0.76 (10/08/2010)   Chol: 246 (10/08/2010)   HDL: 46 (10/08/2010)   LDL: 169 (10/08/2010)   TG: 157 (10/08/2010)  Problem # 6:  HYPERLIPIDEMIA (ICD-272.4) Assessment: Improved  Her updated medication list for this problem includes:    Lovastatin 40 Mg Tabs (Lovastatin) .Marland Kitchen..Marland Kitchen Two tablets at bedtime  Orders: Medicare Electronic Prescription [id number]) T-Lipid Profile [phone]) T-Hepatic Function [phone])  Labs Reviewed: SGOT: 16 (10/09/2010)   SGPT: 15 (10/09/2010)   HDL:46 (10/08/2010), 41 (02/28/2008)  LDL:169 (10/08/2010), 180 (02/28/2008)  Chol:246 (10/08/2010), 269 (02/28/2008)  Trig:157 (10/08/2010), 242 (02/28/2008)  Complete Medication List: 1)  Methadone Hcl 10 Mg Tabs (Methadone hcl) .... One tab by mouth tid 2)  Amlodipine Besylate 10 Mg Tabs (Amlodipine besylate) .... Take one tab by mouth once daily 3)  Lithium Carbonate 450 Mg Cr-tabs (Lithium carbonate) .... Take one at bedtime 4)  Alprazolam 2 Mg Tabs (Alprazolam) .... Take one tab three times a day 5)  Ambien 10 Mg Tabs (Zolpidem tartrate) .... One tab by mouth at bedtime as needed 6)  Viibryd 40 Mg Tabs (Vilazodone hcl) .... One tab by mouth  once daily 7)  Lovastatin 40 Mg Tabs (Lovastatin) .... Two tablets at bedtime  Other Orders: T- Hemoglobin A1C ([id number])  Patient Instructions: 1)  Please schedule a follow-up appointment in 3 months. 2)  Tobacco is very bad for your health and your loved ones! You Should stop smoking!. 3)  Stop Smoking Tips: Choose a Quit date. Cut down before the Quit date. decide what you will do as a substitute when you feel the urge to smoke(gum,toothpick,exercise). 4)  You need to lose weight. Consider a lower calorie diet and regular exercise.  5)  BMP prior to visit, ICD-9: 6)  Hepatic Panel prior to visit, ICD-9: 7)  Lipid Panel prior to visit, ICD-9:  fasting in 3 months 8)  HbgA1C prior to visit, ICD-9: 9)  Schedule your mammogram. Prescriptions: LOVASTATIN 40 MG TABS (LOVASTATIN) two tablets at bedtime  #60 x 3   Entered and Authorized by:   Syliva Overman MD   Signed by:   Syliva Overman MD on 10/18/2010   Method used:   Electronically to        [facility] (retail)       [address]  [place], [place]  [address]       Ph: [phone]       Fax: [phone]   RxID:   [id number]    Orders Added: 1)  Est. Patient Level IV [38756] 2)  Medicare Electronic Prescription [G8553] 3)  T-Basic Metabolic Panel [[id number]] 4)  T-Lipid Profile [[id number]] 5)  T-Hepatic Function [[id number]] 6)  T- Hemoglobin A1C [[id number]]

## 2010-12-31 NOTE — Letter (Signed)
Summary: LABS  LABS   Imported By: Lind Guest 04/30/2010 16:54:18  _____________________________________________________________________  External Attachment:    Type:   Image     Comment:   External Document

## 2011-01-02 ENCOUNTER — Telehealth (INDEPENDENT_AMBULATORY_CARE_PROVIDER_SITE_OTHER): Payer: Self-pay | Admitting: *Deleted

## 2011-01-02 NOTE — Progress Notes (Signed)
Summary: wanted the nurse to call  Phone Note Call from Patient   Summary of Call: patient called wanted nurse to call her  [phone] Initial call taken by: Eugenio Hoes,  November 26, 2010 11:48 AM  Follow-up for Phone Call        patient states she does not want to go to pain management states she wants what is wrong fixed Follow-up by: Adella Hare LPN,  November 26, 2010 1:23 PM  Additional Follow-up for Phone Call Additional follow up Details #1::        i had to leave a msg for [place], she may call back. tell her the way to get the problem fixed is to see a nerosurgeon, she may need surgery, so I have referred her for that.It is VERY impt that she go. I will continue to provide pain meds, no dose change, until she sees the surgeon Additional Follow-up by: Syliva Overman MD,  November 27, 2010 2:26 AM    Additional Follow-up for Phone Call Additional follow up Details #2::    patient called in and states she is in so much pain, she wanted to know if you would give her a pain patch, I again advised patient what your phone note states, she said that wasn't an increase in the medication she was already on.  I said that I would give Dr. Anthony Sar the message.  I also advised her of her appt. with Dr. Camila Li on 12/23/10 at 10:50 to arrive.  She then stated she couldn't wait that long, and I advised her this is when the doctor himself scheduled the appt. but I would try to see if she could come in sooner.  She also stated that she needed a shot for her leg and back, she was in so much pain.  I told her I would put that in a phone note as well.  Then she said "just forget it I will just suffer"  I told Mrs. Hoelting that I didn't want her to have to suffer but;  based on the previous phone note she wasn't getting a decrease in her medication until she saw the surgeon.  Please advise if she can get patches, she stated she really didn't want the shot, but she was taking 4 methyodone (not sure of  spelling) and she was going to run out. Follow-up by: Curtis Sites,  November 27, 2010 9:02 AM  Additional Follow-up for Phone Call Additional follow up Details #3:: Details for Additional Follow-up Action Taken: spoke with pt, she states she cannot tkae ibuprofen and prednisone , will take gabapentin Additional Follow-up by: Syliva Overman MD,  November 27, 2010 11:53 AM  New/Updated Medications: GABAPENTIN 100 MG CAPS (GABAPENTIN) one to three capsules at bedtime for uncontrolled pain Prescriptions: GABAPENTIN 100 MG CAPS (GABAPENTIN) one to three capsules at bedtime for uncontrolled pain  #90 x 1   Entered and Authorized by:   Syliva Overman MD   Signed by:   Syliva Overman MD on 11/27/2010   Method used:   Electronically to        [facility] (retail)       [address]       [place], [place]  [address]       Ph: [phone]       Fax: [phone]   RxID:   [id number]

## 2011-01-02 NOTE — Medication Information (Signed)
Summary: Tax adviser   Imported By: Lind Guest 12/06/2010 16:49:46  _____________________________________________________________________  External Attachment:    Type:   Image     Comment:   External Document

## 2011-01-02 NOTE — Progress Notes (Signed)
Summary: Neurosurgeon/ pain medication  Phone Note Call from Patient   Summary of Call: patient called and states she is in server pain and wants to see the neurosugeron as soon as possible.  I told her I would fax over referral, she really wanted one here in [place].  I told her they were in [place].  she called back and asked if there was one in [place], I called the only one that [facility] showed, and he is only in [place] now.  Patient states her medication isn't working for pain.  I told her I would fax over the paperwork and we would have to wait to hear from them.  Paperwork was sent over to [facility] and spine. Initial call taken by: Curtis Sites,  November 21, 2010 10:40 AM  Follow-up for Phone Call        Patient called back and says she is in a lot of pain, and she states that it feels like someone is taking a sledge hammer to her legs. she states the methadone HCL isn't helping with the pain at all, wanted to know if she could take more then 3 a day or can you please prescribe something else.  Please advise. Follow-up by: Curtis Sites,  November 21, 2010 10:55 AM  Additional Follow-up for Phone Call Additional follow up Details #1::        I cannot increase the dose, see how soon she can be see by neurosurg, and offer apppt with dr Gerilyn Pilgrim for pain mx let me know what she wants Additional Follow-up by: Syliva Overman MD,  November 21, 2010 11:31 AM    Additional Follow-up for Phone Call Additional follow up Details #2::    patient called back and I advised her of what you said, she started crying and wants something done for the pain she is in.  I will call Dr. Ronal Fear office.  Irving Burton put patient down for Jan 13 she will call patient with time.   Follow-up by: Curtis Sites,  November 21, 2010 11:39 AM  Additional Follow-up for Phone Call Additional follow up Details #3:: Details for Additional Follow-up Action Taken: patient called back and said there  ain't no way in "HELL" she can wait that long to go see a doctor. I tried to advise her we were still working on the Neuro appt.  she is begging that we give her something else other then the methadone.  She wants to know if we can't change the medication, then could we put her in the hospital.  She is begging and crying that she is in so much pain.  I advised I would let the doctor know.  I also called [facility] to see when they might could see the patient.  I had to leave a msg.  Patient states she will take a drug test, she isn't just trying to get a drug she is just in so much pain. Additional Follow-up by: Curtis Sites,  November 21, 2010 1:33 PM

## 2011-01-02 NOTE — Progress Notes (Signed)
Summary: test  on leg  Phone Note Call from Patient   Summary of Call: Patient called in this morning, and states her whole Left leg feels like it is asleep and feels like it has pins sticking in it.  She would like to know if we can order an xray.  She states it is hurting real bad.  She went to the ER on Dec. 7 and they keep her up there for 16 hours and they gave her pain medication and that helped for a little bit but they didn't do any test.  She states it feels knumb from her toes to her pelvic area.  She doesn't know why she is feeling this way but in a lot of pain. Initial call taken by: Curtis Sites,  November 11, 2010 8:31 AM  Follow-up for Phone Call        let her know an xray wopnt show the prob, she needs an MRI, I know she is phobic but if she agrees please order that for her I will put in the order, if the ins insits on a visti see if the recent Ed visit will do or does she have to come in Follow-up by: Syliva Overman MD,  November 11, 2010 1:05 PM  Additional Follow-up for Phone Call Additional follow up Details #1::        left msg on machine for patient to return my call. 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  Please advise. Follow-up by: Curtis Sites,  November 11, 2010 3:26 PM  Additional Follow-up for Phone Call Additional follow up Details #3:: Details for Additional Follow-up Action Taken: I will send in ativan for use just before that's what I ccan offer , also I reommend use of an open MRI, which [place], may ant to dsend her there Syliva Overman MD,  November 11, 2010 5:12 PM  Patient is aware you called in medication and she said she was okay with the MRI at [facility] She has an appt. on  Friday the 16th. 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       [facility] (retail)       [address]       [place], [place]  [address]       Ph: [phone]       Fax: [phone]   RxID:   [id number]    Paitient called back she states that please need to run test on her  leg she states tha shes in  a lot of pain

## 2011-01-02 NOTE — Letter (Signed)
Summary: [facility]   Imported By: Curtis Sites 12/03/2010 15:18:51  _____________________________________________________________________  External Attachment:    Type:   Image     Comment:   External Document

## 2011-01-02 NOTE — Progress Notes (Signed)
Summary: RESULTS  Phone Note Call from Patient   Summary of Call: WANTS TO KNOW RESULTS FROM HER TESTS Initial call taken by: Lind Guest,  November 20, 2010 2:12 PM  Follow-up for Phone Call        pls call her with results, they were sent to referrals but can be pullled up Follow-up by: Syliva Overman MD,  November 20, 2010 5:24 PM  Additional Follow-up for Phone Call Additional follow up Details #1::        RETURNED CALL, LEFT MESSAGE Additional Follow-up by: Adella Hare LPN,  November 21, 2010 8:52 AM

## 2011-01-02 NOTE — Progress Notes (Signed)
Summary: please advise  Phone Note Call from Patient   Summary of Call: I called patient to advise her that she has seen Dr. Jeral Fruit at [facility] and they had to retrieve her chart.  Susan Walker states that Dr. Jeral Fruit does his own schedule and as soon as he can review the chart she will let us know.  The patient seems very confused of where I am trying to send her, and she is very concerned that she has been on Methadone for so long that she has become immune to it.  Please see previous phone note as well. Initial call taken by: Curtis Sites,  November 21, 2010 1:51 PM  Follow-up for Phone Call        patient called in and states she doesn't want to go to no Pain Management classes.  She states that they won't do anything for her.  She wants to continue with you, she doesn't want to go to anymore doctors then what she needs to.  I told her I would let you know. Follow-up by: Curtis Sites,  November 22, 2010 9:15 AM  Additional Follow-up for Phone Call Additional follow up Details #1::        noted and message left fo her to call nurse. Additional Follow-up by: Syliva Overman MD,  November 27, 2010 2:24 AM

## 2011-01-02 NOTE — Progress Notes (Signed)
Summary: call  Phone Note Call from Patient   Summary of Call: wants jaime to call her at [phone] Initial call taken by: Lind Guest,  December 25, 2010 3:33 PM  Follow-up for Phone Call        patient states neurosurgeon wants her to have surgery but she refuses and states neuro will send [place] paper about [place] referring her to dr Eduard Clos for injections Follow-up by: Adella Hare LPN,  December 25, 2010 5:23 PM

## 2011-01-08 NOTE — Progress Notes (Signed)
Summary: speak with jamie  Phone Note Call from Patient   Summary of Call: pt stated that she spoke with jamie and she would refer her to dr Eduard Clos   [phone] pt also wants to pick up rx tomorrow Initial call taken by: Rudene Anda,  January 02, 2011 2:46 PM  Follow-up for Phone Call        please see the previous phone note Follow-up by: Adella Hare LPN,  January 02, 2011 3:19 PM  Additional Follow-up for Phone Call Additional follow up Details #1::        order placed pls send MRI report and refer, also pls send for note from the neurosurgeon she saw and pput in my box Additional Follow-up by: Syliva Overman MD,  January 02, 2011 4:11 PM    Additional Follow-up for Phone Call Additional follow up Details #2::    called pt and told her that I had sent her information over to dr. Lacinda Axon office.  Follow-up by: Rudene Anda,  January 02, 2011 4:56 PM

## 2011-01-17 ENCOUNTER — Ambulatory Visit: Payer: Self-pay | Admitting: Family Medicine

## 2011-02-04 ENCOUNTER — Other Ambulatory Visit: Payer: Self-pay | Admitting: Family Medicine

## 2011-02-04 ENCOUNTER — Encounter: Payer: Self-pay | Admitting: Family Medicine

## 2011-02-04 ENCOUNTER — Ambulatory Visit (INDEPENDENT_AMBULATORY_CARE_PROVIDER_SITE_OTHER): Payer: Medicare Other | Admitting: Family Medicine

## 2011-02-04 DIAGNOSIS — R002 Palpitations: Secondary | ICD-10-CM | POA: Insufficient documentation

## 2011-02-04 DIAGNOSIS — R1013 Epigastric pain: Secondary | ICD-10-CM

## 2011-02-04 DIAGNOSIS — I10 Essential (primary) hypertension: Secondary | ICD-10-CM

## 2011-02-04 DIAGNOSIS — R110 Nausea: Secondary | ICD-10-CM

## 2011-02-04 DIAGNOSIS — F17200 Nicotine dependence, unspecified, uncomplicated: Secondary | ICD-10-CM

## 2011-02-04 DIAGNOSIS — Z139 Encounter for screening, unspecified: Secondary | ICD-10-CM

## 2011-02-04 DIAGNOSIS — K3189 Other diseases of stomach and duodenum: Secondary | ICD-10-CM | POA: Insufficient documentation

## 2011-02-06 ENCOUNTER — Encounter: Payer: Self-pay | Admitting: *Deleted

## 2011-02-10 LAB — URINALYSIS, ROUTINE W REFLEX MICROSCOPIC
Bilirubin Urine: NEGATIVE
Ketones, ur: 15 mg/dL — AB
Nitrite: NEGATIVE
Protein, ur: NEGATIVE mg/dL
Specific Gravity, Urine: 1.02 (ref 1.005–1.030)
Urobilinogen, UA: 0.2 mg/dL (ref 0.0–1.0)

## 2011-02-10 LAB — COMPREHENSIVE METABOLIC PANEL
ALT: 20 U/L (ref 0–35)
AST: 15 U/L (ref 0–37)
CO2: 24 mEq/L (ref 19–32)
Calcium: 9.7 mg/dL (ref 8.4–10.5)
Creatinine, Ser: 0.83 mg/dL (ref 0.4–1.2)
GFR calc Af Amer: >60 mL/min (ref >60)
GFR calc non Af Amer: >60 mL/min (ref >60)
Sodium: 143 mEq/L (ref 135–145)
Total Protein: 8.5 g/dL — ABNORMAL HIGH (ref 6.0–8.3)

## 2011-02-10 LAB — LIPASE, BLOOD: Lipase: 23 U/L (ref 11–59)

## 2011-02-10 LAB — DIFFERENTIAL
Eosinophils Absolute: 0 10*3/uL (ref 0.0–0.7)
Eosinophils Relative: 0 % (ref 0–5)
Lymphocytes Relative: 19 % (ref 12–46)
Lymphs Abs: 1.5 10*3/uL (ref 0.7–4.0)
Monocytes Relative: 3 % (ref 3–12)
Neutrophils Relative %: 77 % (ref 43–77)

## 2011-02-10 LAB — CBC
MCH: 31.5 pg (ref 26.0–34.0)
MCHC: 36.2 g/dL — ABNORMAL HIGH (ref 30.0–36.0)
RDW: 12.8 % (ref 11.5–15.5)

## 2011-02-18 NOTE — Assessment & Plan Note (Signed)
Summary: follow up   Vital Signs:  Patient profile:   50 year old female Menstrual status:  hysterectomy Height:      67 inches Weight:      198 pounds BMI:     31.12 O2 Sat:      96 % Pulse rate:   112 / minute Pulse rhythm:   regular Resp:     16 per minute BP sitting:   130 / 80  (left arm) Cuff size:   large  Vitals Entered By: Everitt Amber LPN (February 03, 1609 9:27 AM)  Nutrition Counseling: Patient's BMI is greater than 25 and therefore counseled on weight management options. CC: Follow up chronic problems, c/o still feeling anxious and jittery all the time   CC:  Follow up chronic problems and c/o still feeling anxious and jittery all the time.  History of Present Illness: Reports  that the she isnot doing well. she has chronic uncontrolled neck and back pain Denies recent fever or chills. Denies sinus pressure, nasal congestion , ear pain or sore throat. Denies chest congestion, or cough productive of sputum. . Denies abdominal pain, nausea, vomitting, diarrhea or constipation. Denies change in bowel movements or bloody stool. Denies dysuria , frequency, incontinence or hesitancy.  Denies  vertigo, seizures.  Denies  rash, lesions, or itch.     Preventive Screening-Counseling & Management  Alcohol-Tobacco     Smoking Cessation Counseling: yes  Current Medications (verified): 1)  Methadone Hcl 10 Mg  Tabs (Methadone Hcl) .... One Tab By Mouth Tid 2)  Amlodipine Besylate 10 Mg Tabs (Amlodipine Besylate) .... Take One Tab By Mouth Once Daily 3)  Lithium Carbonate 450 Mg Cr-Tabs (Lithium Carbonate) .... Take One At Bedtime 4)  Alprazolam 2 Mg Tabs (Alprazolam) .... Take One Tab Three Times A Day 5)  Ambien 10 Mg Tabs (Zolpidem Tartrate) .... One Tab By Mouth At Bedtime As Needed 6)  Lovastatin 40 Mg Tabs (Lovastatin) .... Two Tablets At Bedtime  Allergies: 1)  ! * Iv Dye 2)  ! * Gabapentin 3)  ! * Vilazodone  Review of Systems      See HPI General:   Complains of fatigue and malaise. Eyes:  Denies discharge, eye pain, and red eye. CV:  Complains of chest pain or discomfort, fatigue, palpitations, and shortness of breath with exertion; denies swelling of feet; 3 month h/o progressive worsening. Resp:  Complains of cough; denies sputum productive and wheezing; ongoing nicotine use. GI:  Complains of indigestion, nausea, and vomiting; denies constipation and diarrhea; 1 year h/o recurrent nausea and vomitting, also needs and agrees to colonscopy. GU:  Denies discharge, dysuria, and urinary frequency. MS:  Complains of joint pain, low back pain, mid back pain, and stiffness. Neuro:  Complains of headaches and tingling; denies seizures and tremors. Psych:  Complains of anxiety, depression, mental problems, and sense of great danger; denies suicidal thoughts/plans, thoughts of violence, and unusual visions or sounds. Endo:  Complains of weight change; denies cold intolerance, excessive hunger, excessive thirst, and excessive urination. Heme:  Denies abnormal bruising, bleeding, and enlarge lymph nodes. Allergy:  Complains of seasonal allergies.  Physical Exam  General:  Well-developed,obese,in no acute distress; alert,appropriate and cooperative throughout examination HEENT: No facial asymmetry,  EOMI, No sinus tenderness, TM's Clear, oropharynx  pink and moist.   Chest: Clear to auscultation bilaterally. decreased air entry  CVS: S1, S2, No murmurs, No S3.   Abd: Soft, Nontender.  RU:[id number]  ROM spine, hips, shoulders  and knees.  Ext: No edema.   CNS: CN 2-12 intact, power tone and sensation normal throughout.   Skin: Intact, no visible lesions or rashes.  Psych: Good eye contact, normal affect.  Memory loss, bothanxious and depressed appearing.    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 One tab by mouth tid  Problem # 4:  NICOTINE ADDICTION (ICD-305.1) Assessment: Unchanged  Encouraged smoking cessation and discussed different methods for smoking cessation.   Problem # 5:  DEPRESSION (ICD-311) Assessment: Unchanged  The following medications were removed from the medication list:    Viibryd 40 Mg Tabs (Vilazodone hcl) ..... One tab by mouth once daily    Ativan 1 Mg Tabs (Lorazepam) ..... One tablet 30 minutes before procedure, then rept after 15 minutes  if needed Her updated medication list for this problem includes:    Alprazolam 2 Mg Tabs (Alprazolam) .Marland Kitchen... Take one tab three times a day followed by psych  Problem # 6:  HYPERTENSION (ICD-401.9) Assessment: Unchanged  Her updated medication list for this problem includes:    Amlodipine Besylate 10 Mg Tabs (Amlodipine besylate) .Marland Kitchen... Take one tab by mouth once daily  Orders: Medicare Electronic Prescription [phone]) T-Basic Metabolic Panel [phone])  BP today: 130/80 Prior BP: 130/70 (10/18/2010)  Labs Reviewed: K+: 3.8 (10/08/2010) Creat: : 0.76 (10/08/2010)   Chol: 246 (10/08/2010)   HDL: 46 (10/08/2010)   LDL: 169 (10/08/2010)   TG: 157 (10/08/2010)  Problem # 7:  HYPERLIPIDEMIA (ICD-272.4) Assessment: Comment Only  Her updated medication list for this problem includes:    Lovastatin 40 Mg Tabs (Lovastatin) .Marland Kitchen..Marland Kitchen Two tablets at bedtime Low fat dietdiscussed and encouraged  Orders: T-Hepatic Function [id number]) T-Lipid Profile [id number])  Labs Reviewed: SGOT: 16 (10/09/2010)   SGPT: 15 (10/09/2010)   HDL:46 (10/08/2010), 41 (02/28/2008)  LDL:169 (10/08/2010), 180 (02/28/2008)  Chol:246 (10/08/2010), 269  (02/28/2008)  Trig:157 (10/08/2010), 242 (02/28/2008)  Complete Medication List: 1)  Methadone Hcl 10 Mg Tabs (Methadone hcl) .... One tab by mouth tid 2)  Amlodipine Besylate 10 Mg Tabs (Amlodipine besylate) .... Take one tab by mouth once daily 3)  Lithium Carbonate 450 Mg Cr-tabs (Lithium carbonate) .... Take one at bedtime 4)  Alprazolam 2 Mg Tabs (Alprazolam) .... Take one tab three times a day 5)  Ambien 10 Mg Tabs (Zolpidem tartrate) .... One tab by mouth at bedtime as needed 6)  Lovastatin 40 Mg Tabs (Lovastatin) .... Two tablets at bedtime  Other Orders: T- Hemoglobin A1C ([id number]) T-TSH ([id number])  Patient Instructions: 1)  Please schedule a follow-up appointment in3 months. 2)  Tobacco is very bad for your health and your loved ones! You Should stop smoking!. 3)  Stop Smoking Tips: Choose a Quit date. Cut down before the Quit date. decide what you will do as a substitute when you feel the urge to smoke(gum,toothpick,exercise). 4)  BMP prior to visit, ICD-9: 5)  Hepatic Panel prior to visit, ICD-9: 6)  Lipid Panel prior to visit, ICD-9:   fasting asap 7)  TSH prior to visit, ICD-9: 8)  HbgA1C prior to visit, ICD-9: 9)  You will be referred fo a gallbladder ultrasound  also for a  mamogram in April. 10)  You ar referred to cardiology in March, pls go to the Ed if you develop severe palpitations or shortness of breath   Orders Added: 1)  Est. Patient Level IV [57846] 2)  Medicare Electronic Prescription [G8553] 3)  T-Basic Metabolic Panel [[id number]] 4)  T-Hepatic Function [80076-22960] 5)  T-Lipid Profile [80061-22930] 6)  T- Hemoglobin A1C [83036-23375] 7)  T-TSH [[id number]] 8)  Radiology Referral [Radiology] 9)  Radiology Referral [Radiology] 10)  Cardiology Referral [Cardiology]

## 2011-02-28 ENCOUNTER — Other Ambulatory Visit: Payer: Self-pay

## 2011-02-28 MED ORDER — METHADONE HCL 10 MG PO TABS: 10.0000 mg | ORAL_TABLET | Freq: Three times a day (TID) | ORAL | Status: DC

## 2011-03-06 ENCOUNTER — Encounter: Payer: Self-pay | Admitting: Cardiology

## 2011-03-06 ENCOUNTER — Ambulatory Visit (INDEPENDENT_AMBULATORY_CARE_PROVIDER_SITE_OTHER): Payer: Medicare Other | Admitting: Cardiology

## 2011-03-06 ENCOUNTER — Encounter: Payer: Self-pay | Admitting: *Deleted

## 2011-03-06 ENCOUNTER — Other Ambulatory Visit: Payer: Self-pay | Admitting: Family Medicine

## 2011-03-06 ENCOUNTER — Other Ambulatory Visit (INDEPENDENT_AMBULATORY_CARE_PROVIDER_SITE_OTHER): Payer: Medicare Other

## 2011-03-06 VITALS — BP 164/90 | HR 94 | Ht 68.0 in | Wt 201.0 lb

## 2011-03-06 DIAGNOSIS — I10 Essential (primary) hypertension: Secondary | ICD-10-CM

## 2011-03-06 DIAGNOSIS — M549 Dorsalgia, unspecified: Secondary | ICD-10-CM

## 2011-03-06 DIAGNOSIS — E785 Hyperlipidemia, unspecified: Secondary | ICD-10-CM

## 2011-03-06 DIAGNOSIS — R002 Palpitations: Secondary | ICD-10-CM

## 2011-03-06 MED ORDER — AMLODIPINE BESYLATE 10 MG PO TABS: 10.0000 mg | ORAL_TABLET | Freq: Every day | ORAL | Status: DC

## 2011-03-06 NOTE — Assessment & Plan Note (Addendum)
There is no irregularity subjectively, no documentation in the past of a significant arrhythmia and a clear relationship to severe subjective anxiety.  Recent thyroid function studies were normal.  Most likely patient is not experiencing any pathologic arrhythmia.  She may be developing sinus tachycardia with anxiety or simply sense the stimulatory effect of adrenaline release on myocardial contractility.  She will be provided with an event recorder for 3 weeks in an attempt to sort out these issues.  She may benefit from treatment with metoprolol, but that will be reserved for the time being.

## 2011-03-06 NOTE — Progress Notes (Signed)
HPI:  Susan Walker was evaluated in the office today at the kind request of Dr. Lodema Hong for palpitations and chest discomfort.  This unfortunate woman has significant psychologic problems and is followed by a psychiatrist.  She reports severe anxiety that is associated with palpitations and left chest tightness radiating to the left arm.  She has no orthopnea, PND or pedal edema.  She does not have exertional symptoms, but lifestyle is sedentary.  She was previously evaluated by Dr. Eden Emms in 2008 with a negative stress nuclear study.  She has no recollection of that examination or of ever having undergone an echocardiogram or cardiac catheterization.  Patient was perceptibly anxious during evaluation with multiple references to that problem and questions regarding its etiology and treatment.  These were referred to her primary care physician and psychiatrist.  Current Outpatient Prescriptions on File Prior to Visit  Medication Sig Dispense Refill  . alprazolam (XANAX) 2 MG tablet Take 2 mg by mouth 3 (three) times daily. Take one tab three times a day       . lithium (ESKALITH) 450 MG CR tablet Take 450 mg by mouth as directed. Take one at bedtime       . LORazepam (ATIVAN) 1 MG tablet Take 1 mg by mouth as directed. One tablet 30 minutes before procedure, then repeat after 15 minutes if needed       . methadone (DOLOPHINE) 10 MG tablet Take 1 tablet (10 mg total) by mouth 3 (three) times daily. One tab by mouth tid  90 tablet  0  . zolpidem (AMBIEN) 10 MG tablet Take 10 mg by mouth at bedtime as needed. One tablet by mouth at bedtime as needed       . gabapentin (NEURONTIN) 100 MG capsule Take 100 mg by mouth as directed. One to three capsules at bedtime for uncontrolled pain       . VIIBRYD PO Take 40 mg by mouth daily. One tab by mouth once daily       . DISCONTD: amLODipine (NORVASC) 10 MG tablet Take 10 mg by mouth daily. Take one tab by mouth once daily       . DISCONTD: amLODipine-benazepril  (LOTREL) 10-20 MG per capsule Take 1 capsule by mouth daily.        Marland Kitchen DISCONTD: lovastatin (MEVACOR) 40 MG tablet Take 40 mg by mouth at bedtime.          Allergies  Allergen Reactions  . Gabapentin   . Iohexol      Desc: SWELLING IN THROAT   . Vilazodone Hcl       Past Medical History  Diagnosis Date  . Chest pain, unspecified 12/2006    Normal Echo and myovue   . Dyspnea   . Nicotine addiction   . Back pain   . Depression   . Hyperlipidemia   . Hypertension   . Panic attack   . Palpitations   . Cerebrovascular disease     carotid bruit  . Obesity   . Gastroesophageal reflux disease      Past Surgical History  Procedure Date  . Total abdominal hysterectomy 2002  . Cholecystectomy 2004     Family History  Problem Relation Age of Onset  . Heart failure Mother      History   Social History  . Marital Status: Single    Spouse Name: N/A    Number of Children: 1  . Years of Education: N/A   Occupational History  .  disable     Social History Main Topics  . Smoking status: Current Everyday Smoker -- 0.5 packs/day for 30 years  . Smokeless tobacco: Never Used  . Alcohol Use: No  . Drug Use: No  . Sexually Active: Not on file   Other Topics Concern  . Not on file   Social History Narrative  . No narrative on file     ROS: General: no anorexia, weight gain or weight loss Cardiac: no chest pain, dyspnea, orthopnea, PND,  or syncope Respiratory: no cough, sputum production or hemoptysis GI: no nausea, abdominal pain, emesis, diarrhea or constipation Integument: no significant lesions Neurologic: No muscle weakness or paralysis; no speech disturbance; no headache All other systems reviewed and are negative.  PHYSICAL EXAM: BP 164/90  Pulse 94  Ht 5\' 8"  (1.727 m)  Wt 201 lb (91.173 kg)  BMI 30.56 kg/m2  SpO2 98%  General-Well developed; no acute distress Body habitus-mildly obese Neck-No JVD; no carotid bruits Lungs-clear lung fields; resonant to  percussion Cardiovascular-normal PMI; normal S1 and S2 Abdomen-normal bowel sounds; soft and non-tender without masses or organomegaly Musculoskeletal-No deformities, no cyanosis or clubbing Neurologic-Normal cranial nerves; symmetric strength and tone Skin-Warm, no significant lesions Extremities-distal pulses intact; trace edema  EKG:   Normal sinus rhythm; biatrial enlargement; possible prior inferior MI; delayed R-wave progression; nonspecific T-wave abnormality; no previous tracing for comparison.  ASSESSMENT AND PLAN:

## 2011-03-06 NOTE — Patient Instructions (Signed)
Your physician recommends that you schedule a follow-up appointment in:AS NEEDED Your physician has requested that you have an echocardiogram. Echocardiography is a painless test that uses sound waves to create images of your heart. It provides your doctor with information about the size and shape of your heart and how well your heart's chambers and valves are working. This procedure takes approximately one hour. There are no restrictions for this procedure.  Your physician has requested that you have a stress echocardiogram. For further information please visit [email or web address] Please follow instruction sheet as given.  Your physician has recommended that you wear an event monitor. Event monitors are medical devices that record the heart's electrical activity. Doctors most often [place] these monitors to diagnose arrhythmias. Arrhythmias are problems with the speed or rhythm of the heartbeat. The monitor is a small, portable device. You can wear one while you do your normal daily activities. This is usually used to diagnose what is causing palpitations/syncope (passing out).

## 2011-03-06 NOTE — Assessment & Plan Note (Signed)
Lipid profile is fairly impressive with total cholesterol 246, triglycerides 157, HDL 46 and LDL of 169 a few months ago; however, based upon previous drug trials in the past, it appears the patient has not tolerated treatment with statins. While pharmacologic therapy would be desirable, it is not absolutely mandatory.

## 2011-03-06 NOTE — Assessment & Plan Note (Signed)
Blood pressure control is suboptimal at this visit, but that may be related to patient anxiety.  Obtaining values taken at home would be desirable.  If blood pressure requires additional medical therapy, thiazide diuretic would not be desirable due to treatment with lithium.  A beta blocker might be useful it might also help with her palpitations.  Although there is some risk for depression with this agent, I don't think that would be a major consideration for Susan Walker.

## 2011-03-24 ENCOUNTER — Ambulatory Visit (HOSPITAL_COMMUNITY): Payer: Medicare Other

## 2011-03-28 ENCOUNTER — Other Ambulatory Visit: Payer: Self-pay

## 2011-03-28 MED ORDER — METHADONE HCL 10 MG PO TABS: 10.0000 mg | ORAL_TABLET | Freq: Three times a day (TID) | ORAL | Status: DC

## 2011-04-18 NOTE — Op Note (Signed)
NAME:  Susan [name], Susan [name]                        ACCOUNT NO.:  [id number]   MEDICAL RECORD NO.:  [id number]                   PATIENT TYPE:  AMB   LOCATION:  DAY                                  FACILITY:  [facility]   PHYSICIAN:  Leroy C. Katrinka Blazing, M.D.                DATE OF BIRTH:  02/20/1961   DATE OF PROCEDURE:  09/12/2003  DATE OF DISCHARGE:                                 OPERATIVE REPORT   PREOPERATIVE DIAGNOSIS:  Cholelithiasis, cholecystitis.   POSTOPERATIVE DIAGNOSIS:  Cholelithiasis, cholecystitis.   OPERATION/PROCEDURE:  Laparoscopic cholecystectomy.   SURGEON:  Dirk Dress. Katrinka Blazing, M.D.   DESCRIPTION OF PROCEDURE:  Under general endotracheal anesthesia the  patient's abdomen was prepped and draped into [name] sterile field.  [name]  supraumbilical midline incision was made slightly above the umbilicus.  Intra-abdominal pressure was about 2.  Abdomen was insufflated with 2 L of  CO2.  Using [name] Visiport guide [name] 10 mm port was placed uneventfully.  Laparoscope was placed.  There were some adhesions around the port site.  These were bluntly dissected and laparoscope was then able to be passed up  into the right upper quadrant.  Under videoscopic guidance, [name] 10 mm port and  two 5 mm ports were placed uneventfully.  The gallbladder was grasped and  positioned.  Cystic duct was dissected, clipped with four clips and divided.  The cystic artery had two branches.  Each was clipped close to the  gallbladder with three clips and divided.  The gallbladder was then  separated from the infrahepatic bed using electrocautery.  Gallbladder was  placed in an EndoCatch device and retrieved.  Copious irrigation was carried  out until the fluid returned clear.  There was no bleeding or bowel leak  from the bed. The camera was removed from the supraumbilical port to the  right upper quadrant.  Evaluation of the adhesions to the anterior abdominal  wall in the paraumbilical area and around the port revealed that  this was  just very thin omentum without any bowel contained in the omentum.  There  was no insufflation of the peritoneum or other viscera.  The procedure was  terminated.  CO2 was allowed to escape from the abdomen.  The ports were  removed.  The incisions were closed using 0 Dexon on the fascia of the  paraumbilical incision.  The other incisions were closed with staples on the  skin only.  Dressings were placed.  The patient tolerated the procedure  well.  She was awakened from anesthesia uneventfully, transferred to [name] bed  and taken to the post anesthesia care unit.      ___________________________________________                                            Dirk Dress. Katrinka Blazing,  M.D.   LCS/MEDQ  D:  09/12/2003  T:  09/12/2003  Job:  [id number]

## 2011-04-18 NOTE — Procedures (Signed)
[facility]                              EXERCISE TREADMILL   NAME:SCALESSujata, Maines                     MRN:          [id number]  DATE:12/14/2006                            DOB:          [birth year]    Ms. Gasiorowski is a 50 year old patient with recurrent chest pain. Stress  Myoview was performed in the office.   The patient exercised 4 minutes using standard Bruce protocol. Exercise  was stopped due to fatigue. Maximum heart rate was 136, peak blood  pressure was 192/88.   Restin ECG was normal with stress there was occasional PVCs but no  ischemia.   Exercise stress test with somewhat poor exercise tolerance, hypertensive  response to exercise. No evidence of ischemia.   Myoview images to follow.     Noralyn Pick. Eden Emms, MD, [facility]  Electronically Signed    PCN/MedQ  DD: 12/14/2006  DT: 12/14/2006  Job #: [id number]

## 2011-04-18 NOTE — H&P (Signed)
   NAME:  Susan [name], Susan [name]                        ACCOUNT NO.:  [id number]   MEDICAL RECORD NO.:  [id number]                   PATIENT TYPE:  AMB   LOCATION:  DAY                                  FACILITY:  [facility]   PHYSICIAN:  Leroy C. Katrinka Blazing, M.D.                DATE OF BIRTH:  01-31-1961   DATE OF ADMISSION:  DATE OF DISCHARGE:                                HISTORY & PHYSICAL   PRIMARY CARE PHYSICIAN:  Milus Mallick. Lodema Hong, M.D.   HISTORY OF PRESENT ILLNESS:  [age]-year-old female with [name] history of  increasing severe pains beneath both breasts radiating through to her back.  She had discomfort on [name] daily basis.  She was thoroughly evaluated and was  found to have multiple gallstones.  She is scheduled for laparoscopic  cholecystectomy.   PAST HISTORY:  She has anxiety attacks with depression, hypertension and  chronic pain.  She has severe cervical and lumbar disk disease.   MEDICATIONS:  1. Methadone 10 mg four times daily.  2. Xanax 1 mg t.i.d.  3. Toprol-XL 50 mg daily.  4. Avalide 300/12.5 mg daily.  5. Trazodone 100 mg nightly.  6. Effexor XR 75 mg daily.   SURGERY:  Total abdominal hysterectomy.   ALLERGIES:  CONTRAST DYE, which causes shortness of breath, swelling of her  neck and hives.   PHYSICAL EXAMINATION:  VITAL SIGNS:  On examination, blood pressure 120/74,  pulse 72, respirations 20, weight 207 pounds.  HEENT:  Unremarkable.  NECK:  Neck is supple without JVD or bruit.  CHEST:  Chest clear to auscultation.  HEART:  Regular rate and rhythm without murmur, gallop or rub.  ABDOMEN:  Abdomen obese.  Moderate epigastric tenderness.  Normoactive bowel  sounds.  EXTREMITIES:  No cyanosis, clubbing or edema.  BACK:  Moderate tenderness of the lumbar spine.  NEUROLOGIC:  Exam nonfocal.   IMPRESSION:  1. Cholelithiasis and cholecystitis.  2. Lumbar degenerative disk disease.  3.     Cervical degenerative disk disease.  4. Depression with anxiety.   PLAN:   Laparoscopic cholecystectomy.     ___________________________________________                                         Dirk Dress Katrinka Blazing, M.D.   LCS/MEDQ  D:  09/11/2003  T:  09/12/2003  Job:  [id number]

## 2011-04-18 NOTE — Assessment & Plan Note (Signed)
[facility]                       [facility] OFFICE NOTE   NAME:Susan [name], Susan [name]                     MRN:          [id number]  DATE:12/09/2006                            DOB:          [birth year]    Ms. Scale is a somewhat emotional 50 year old patient seen today for  multiple questions.  The patient has a marked family history of  cardiomyopathy and death due to premature heart problems.  She has been  having chest pain and shortness of breath.  There is a question of  borderline untreated hypertension.  In talking to the patient, she is  quite emotional.  She lost a cousin recently to alcoholism and heart  problems.  There is an extensive family history of drug addiction,  alcoholism, and premature death from cardiac problem.  The patient has  been noting chest pain over the last few weeks.  It is atypical for  angina.  It is sharp, it is in the middle of the chest, it can radiate  to her neck.  It is not necessarily exertional.  There is no associated  PND or orthopnea.   Review of systems is remarkable for no previous cardiac workup.  There  is a question of heart enlargement in the past.  She has not had any  significant lower extremity edema, palpitations, or syncope.   Her medications include Lunesta 3 mg at night, Azor 5/20, Alprazolam 1  mg q.i.d., Methadone 10 t.i.d., Seroquel 200 mg q.h.s.   Family History:  See above in history   PAST MEDICAL HISTORY:  Remarkable for continued moderate alcohol use,  hypertension, on chronic Methadone for lower back pain.  No previous  lumbar surgery.   PAST SURGICAL HISTORY:  Hysterectomy and gallbladder.   SOCIAL HISTORY:  She smokes 1/2 a pack a day.   ALLERGIES:  She reports being allergic to X-RAY DYE.   As indicated, the patient does not work.  She drinks moderately during  the day.  She is on disability for her back.   PHYSICAL EXAMINATION:  VITAL SIGNS:  Blood pressure 138/78,  pulse is 83 and regular.  HEENT:  Normal.  There is no lymphadenopathy, no thyromegaly.  LUNGS:  Clear.  HEART:  There is a S1 and S2 with a systolic ejection murmur.  ABDOMEN:  Benign.  There is no renal bruits.  EXTREMITIES:  Distal pulses are intact with no edema.   EKG shows sinus rhythm with poor R-wave progression. There is also left  axis deviation and question Q-waves in 2, 3, and F.   IMPRESSION:  Ms. Turvey is having somewhat atypical chest pain and  shortness of breath.  I believe she has some anxiety over her cousins  recent death and the marked propensity for alcoholism and cardiomyopathy  in the family.  She clearly needs a workup, however.  She has a systolic  ejection murmur and an abnormal EKG.  Given her moderate alcohol use, I  think a 2D echocardiogram would be indicated to assess LV cardiac size  and function and to rule out cardiomyopathy.  There is a question of  poor  R-wave progression in Qs, 3, and F, and I think that, given her  family history, this clearly needs to be done.  Given her chest pain and  shortness of breath, she should have a stress Myoview despite her back  problem that makes her on disability, I think that she can walk on the  treadmill, this will help rule out coronary artery disease.  Her blood  pressure seems reasonable.  She will continue to follow up with Dr.  Lodema Hong regarding her psychological and Methadone issues.  Further  recommendations will be based on the results of her stress Myoview and  echo.     Noralyn Pick. Eden Emms, MD, [facility]  Electronically Signed    PCN/MedQ  DD: 12/09/2006  DT: 12/09/2006  Job #: [id number]   cc:   Milus Mallick. Lodema Hong, M.D.

## 2011-04-18 NOTE — Procedures (Signed)
   NAME:  Susan [name], Susan [name]                        ACCOUNT NO.:  [id number]   MEDICAL RECORD NO.:  [id number]                   PATIENT TYPE:  OUT   LOCATION:  RAD                                  FACILITY:  [facility]   PHYSICIAN:  Gerrit Friends. Dietrich Pates, M.D. [facility]        DATE OF BIRTH:  25-Aug-1961   DATE OF PROCEDURE:  07/08/2002  DATE OF DISCHARGE:                                  ECHOCARDIOGRAM   CLINICAL DATA:  A 50 year old woman with edema and hypertension.   IMPRESSION:  1. Technically adequate echocardiographic study.  2. Normal left atrium, right atrium, and right ventricle.  3. Normal aortic, mitral, tricuspid, and pulmonic valves.  4. Normal internal dimension of the left ventricle with borderline LVH.     Normal regional and global LV systolic function.  5. Normal Doppler examination.                                               Gerrit Friends. Dietrich Pates, M.D. [facility]    RMR/MEDQ  D:  07/08/2002  T:  07/08/2002  Job:  [id number]

## 2011-04-18 NOTE — Procedures (Signed)
[name], [name]              ACCOUNT NO.:  [id number]   MEDICAL RECORD NO.:  [id number]          PATIENT TYPE:  REC   LOCATION:  RAD                           FACILITY:  [facility]   PHYSICIAN:  Susan C. Eden Emms, MD, FACCDATE OF BIRTH:  October 01, 1961   DATE OF PROCEDURE:  12/14/2006  DATE OF DISCHARGE:                                ECHOCARDIOGRAM   INDICATIONS:  Hypertension, chest pain, family history of coronary  disease.   Left ventricular cavity size was normal.  EF was 65%.  There was no  regional wall motion abnormality. Septal thickness was upper limits of  normal at 11 mm.  Mitral, aortic, and tricuspid valves were normal.  There was no significant valvular heart disease.  Left atrium and right-  sided cardiac chambers were normal.  There was no evidence of pulmonary  retention  subcostal imaging revealed no atrioseptal defect, no source  of embolus, no effusion.   M-mode measurements included an aortic diameter of 2.8 cm, left atrial  diameter of 3.3 cm, septal thickness 11 mm, posterior wall thickness 11  mm, LV diastolic dimension 3.5 cm, LV systolic dimension 2.5 cm.   FINAL IMPRESSION:  The normal 2-D echocardiogram, ejection fraction 60%.      Susan [name]. Eden Emms, MD, [facility]  Electronically Signed     PCN/MEDQ  D:  12/14/2006  T:  12/14/2006  Job:  [id number]

## 2011-04-25 ENCOUNTER — Other Ambulatory Visit: Payer: Self-pay

## 2011-04-25 MED ORDER — METHADONE HCL 10 MG PO TABS: 10.0000 mg | ORAL_TABLET | Freq: Three times a day (TID) | ORAL | Status: DC

## 2011-04-30 ENCOUNTER — Telehealth: Payer: Self-pay | Admitting: Family Medicine

## 2011-05-01 ENCOUNTER — Other Ambulatory Visit: Payer: Self-pay

## 2011-05-01 MED ORDER — AMLODIPINE BESYLATE 10 MG PO TABS: 10.0000 mg | ORAL_TABLET | Freq: Every day | ORAL | Status: DC

## 2011-05-01 NOTE — Telephone Encounter (Signed)
BP med filled and aware narc is ready

## 2011-05-23 ENCOUNTER — Other Ambulatory Visit: Payer: Self-pay

## 2011-05-23 MED ORDER — METHADONE HCL 10 MG PO TABS: 10.0000 mg | ORAL_TABLET | Freq: Three times a day (TID) | ORAL | Status: DC

## 2011-05-26 ENCOUNTER — Telehealth: Payer: Self-pay | Admitting: Family Medicine

## 2011-05-26 NOTE — Telephone Encounter (Signed)
Patient aware scripts are available

## 2011-06-20 ENCOUNTER — Other Ambulatory Visit: Payer: Self-pay

## 2011-06-20 MED ORDER — METHADONE HCL 10 MG PO TABS: 10.0000 mg | ORAL_TABLET | Freq: Three times a day (TID) | ORAL | Status: DC

## 2011-07-18 ENCOUNTER — Ambulatory Visit (INDEPENDENT_AMBULATORY_CARE_PROVIDER_SITE_OTHER): Payer: Medicare Other | Admitting: Family Medicine

## 2011-07-18 ENCOUNTER — Other Ambulatory Visit: Payer: Self-pay

## 2011-07-18 ENCOUNTER — Encounter: Payer: Self-pay | Admitting: Family Medicine

## 2011-07-18 VITALS — BP 130/80 | HR 80 | Ht 68.0 in | Wt 213.0 lb

## 2011-07-18 DIAGNOSIS — F17200 Nicotine dependence, unspecified, uncomplicated: Secondary | ICD-10-CM

## 2011-07-18 DIAGNOSIS — I10 Essential (primary) hypertension: Secondary | ICD-10-CM

## 2011-07-18 DIAGNOSIS — F329 Major depressive disorder, single episode, unspecified: Secondary | ICD-10-CM

## 2011-07-18 DIAGNOSIS — E785 Hyperlipidemia, unspecified: Secondary | ICD-10-CM

## 2011-07-18 MED ORDER — METHADONE HCL 10 MG PO TABS: 10.0000 mg | ORAL_TABLET | Freq: Three times a day (TID) | ORAL | Status: DC

## 2011-07-18 NOTE — Patient Instructions (Signed)
F/u in 4 months.  Continue  Cutting back on cigaretes, you need to quit  Continue to stay off of drugs.  You will be referred to Dr Lolly Mustache for continuity of mental health care

## 2011-07-19 NOTE — Assessment & Plan Note (Signed)
Unchanged, no plan on quitting at this time

## 2011-07-19 NOTE — Assessment & Plan Note (Signed)
Controlled, no change in medication  

## 2011-07-19 NOTE — Progress Notes (Signed)
  Subjective:    Patient ID: Susan Walker, female    DOB: 1960-12-20, 50 y.o.   MRN: [id number]  HPI The PT is here for follow up and re-evaluation of chronic medical conditions, medication management and review of any available recent lab and radiology data.  Preventive health is updated, specifically  Cancer screening and Immunization.  Refuses immunization Questions or concerns regarding consultations or procedures which the PT has had in the interim are  addressed. The PT denies any adverse reactions to current medications since the last visit.  Severe chronic depression. States she has not bathed in 2 weeks except to come for this visit. "Nothing in my life goes well"I can't pat my bills. Not actively suicidal or homicidal.Concerned about finding a new psychiatrist, unable to get to current provider    Review of Systems Denies recent fever or chills. Denies sinus pressure, nasal congestion, ear pain or sore throat. Denies chest congestion, productive cough or wheezing. Denies chest pains, palpitations and leg swelling Denies abdominal pain, nausea, vomiting,diarrhea or constipation.   Denies dysuria, frequency, hesitancy or incontinence.  Denies headaches, seizures, numbness, or tingling.  Denies skin break down or rash.        Objective:   Physical Exam Patient alert and oriented and in no cardiopulmonary distress.  HEENT: No facial asymmetry, EOMI, no sinus tenderness,  oropharynx pink and moist.  Neck decreased ROM no adenopathy.  Chest: Clear to auscultation bilaterally.Decreased air entry bilaterally  CVS: S1, S2 no murmurs, no S3.  ABD: Soft non tender. Bowel sounds normal.  Ext: No edema  MS: decreased  ROM spine, adequate in  shoulders, hips and knees.  Skin: Intact, no ulcerations or rash noted.  Psych: Poor  eye contact, flat affect. Memory loss, tearful, anxious and  depressed appearing.  CNS: CN 2-12 intact, power, tone and sensation normal  throughout.        Assessment & Plan:

## 2011-07-19 NOTE — Assessment & Plan Note (Signed)
Severe and unchanged, will attempt to get pt into local clinic

## 2011-07-19 NOTE — Assessment & Plan Note (Signed)
Uncontrolled, low fat diet encouraged, due to severe depression , pt unable to focus on  and process the thought of  any new medications

## 2011-08-15 ENCOUNTER — Other Ambulatory Visit: Payer: Self-pay

## 2011-08-15 MED ORDER — METHADONE HCL 10 MG PO TABS: 10.0000 mg | ORAL_TABLET | Freq: Three times a day (TID) | ORAL | Status: DC

## 2011-08-25 ENCOUNTER — Telehealth: Payer: Self-pay | Admitting: Family Medicine

## 2011-08-25 NOTE — Telephone Encounter (Signed)
[facility]. Pt is aware when she came in office

## 2011-09-19 ENCOUNTER — Other Ambulatory Visit: Payer: Self-pay

## 2011-09-19 MED ORDER — METHADONE HCL 10 MG PO TABS: 10.0000 mg | ORAL_TABLET | Freq: Three times a day (TID) | ORAL | Status: DC

## 2011-09-23 ENCOUNTER — Telehealth: Payer: Self-pay | Admitting: Family Medicine

## 2011-09-23 NOTE — Telephone Encounter (Signed)
Noted. It is ready

## 2011-10-16 ENCOUNTER — Telehealth: Payer: Self-pay | Admitting: Family Medicine

## 2011-10-17 NOTE — Telephone Encounter (Signed)
No it is not due until next Friday but they will be ready on Monday

## 2011-10-20 ENCOUNTER — Other Ambulatory Visit: Payer: Self-pay

## 2011-10-20 MED ORDER — METHADONE HCL 10 MG PO TABS: 10.0000 mg | ORAL_TABLET | Freq: Three times a day (TID) | ORAL | Status: DC

## 2011-11-07 ENCOUNTER — Encounter: Payer: Self-pay | Admitting: Family Medicine

## 2011-11-12 ENCOUNTER — Other Ambulatory Visit: Payer: Self-pay | Admitting: Family Medicine

## 2011-11-13 ENCOUNTER — Encounter: Payer: Self-pay | Admitting: Family Medicine

## 2011-11-13 ENCOUNTER — Ambulatory Visit (INDEPENDENT_AMBULATORY_CARE_PROVIDER_SITE_OTHER): Payer: Medicare Other | Admitting: Family Medicine

## 2011-11-13 VITALS — BP 122/76 | HR 90 | Resp 18 | Ht 68.0 in | Wt 215.0 lb

## 2011-11-13 DIAGNOSIS — F17200 Nicotine dependence, unspecified, uncomplicated: Secondary | ICD-10-CM

## 2011-11-13 DIAGNOSIS — IMO0002 Reserved for concepts with insufficient information to code with codable children: Secondary | ICD-10-CM

## 2011-11-13 DIAGNOSIS — R5381 Other malaise: Secondary | ICD-10-CM

## 2011-11-13 DIAGNOSIS — F329 Major depressive disorder, single episode, unspecified: Secondary | ICD-10-CM

## 2011-11-13 DIAGNOSIS — R7301 Impaired fasting glucose: Secondary | ICD-10-CM

## 2011-11-13 DIAGNOSIS — I10 Essential (primary) hypertension: Secondary | ICD-10-CM

## 2011-11-13 DIAGNOSIS — E785 Hyperlipidemia, unspecified: Secondary | ICD-10-CM

## 2011-11-13 LAB — LIPID PANEL
HDL: 46 mg/dL (ref >39)
LDL Cholesterol: 191 mg/dL — ABNORMAL HIGH (ref 0–99)
Total CHOL/HDL Ratio: 6 Ratio
Triglycerides: 207 mg/dL — ABNORMAL HIGH (ref <150)
VLDL: 41 mg/dL — ABNORMAL HIGH (ref 0–40)

## 2011-11-13 LAB — CBC WITH DIFFERENTIAL/PLATELET
Eosinophils Relative: 6 % — ABNORMAL HIGH (ref 0–5)
HCT: 45.7 % (ref 36.0–46.0)
Hemoglobin: 15.5 g/dL — ABNORMAL HIGH (ref 12.0–15.0)
Lymphocytes Relative: 30 % (ref 12–46)
Lymphs Abs: 2 10*3/uL (ref 0.7–4.0)
MCV: 90.3 fL (ref 78.0–100.0)
Monocytes Absolute: 0.4 10*3/uL (ref 0.1–1.0)
Monocytes Relative: 5 % (ref 3–12)
Neutro Abs: 3.8 10*3/uL (ref 1.7–7.7)
WBC: 6.5 10*3/uL (ref 4.0–10.5)

## 2011-11-13 LAB — BASIC METABOLIC PANEL
CO2: 24 mEq/L (ref 19–32)
Calcium: 9.9 mg/dL (ref 8.4–10.5)
Creat: 0.7 mg/dL (ref 0.50–1.10)
Sodium: 140 mEq/L (ref 135–145)

## 2011-11-13 LAB — HEPATIC FUNCTION PANEL
Bilirubin, Direct: 0.1 mg/dL (ref 0.0–0.3)
Indirect Bilirubin: 0.3 mg/dL (ref 0.0–0.9)
Total Bilirubin: 0.4 mg/dL (ref 0.3–1.2)
Total Protein: 8 g/dL (ref 6.0–8.3)

## 2011-11-13 MED ORDER — AMLODIPINE BESYLATE 10 MG PO TABS: 10.0000 mg | ORAL_TABLET | Freq: Every day | ORAL | Status: DC

## 2011-11-13 MED ORDER — METHADONE HCL 10 MG PO TABS: 10.0000 mg | ORAL_TABLET | Freq: Four times a day (QID) | ORAL | Status: DC

## 2011-11-13 NOTE — Assessment & Plan Note (Signed)
Controlled, no change in medication  

## 2011-11-13 NOTE — Patient Instructions (Signed)
F/u in 12 weeks   Labs today.  Dose increase in your pain meds starting today.  Pls continue to cut back on cigarettes , you need to quit

## 2011-11-13 NOTE — Assessment & Plan Note (Signed)
Uncontrolled, states does not want surgery requests dose increase

## 2011-11-13 NOTE — Assessment & Plan Note (Signed)
Down to 6 cigarettes daily, unwilling to quit at this time

## 2011-11-13 NOTE — Assessment & Plan Note (Signed)
Unchanged, followed by psychiatry, not suicidal or homicidal

## 2011-11-13 NOTE — Progress Notes (Signed)
  Subjective:    Patient ID: Susan Walker, female    DOB: Dec 06, 1960, 50 y.o.   MRN: [id number]  HPI The PT is here for follow up and re-evaluation of chronic medical conditions, medication management and review of any available recent lab and radiology data.  Preventive health is updated, specifically  Cancer screening and Immunization.   Questions or concerns regarding consultations or procedures which the PT has had in the interim are  Addressed. The PT denies any adverse reactions to current medications since the last visit.  There are no new concerns.  There are no specific complaints  C/o uncontrolled and increased  Pain in neck and back, does not want surgery, requests dose increase on medication. Continues to experience severe depression, followed by psychiatry, denies suicidal or homicidal plan.    Review of Systems See HPI Denies recent fever or chills. Denies sinus pressure, nasal congestion, ear pain or sore throat. Denies chest congestion, productive cough or wheezing. Denies chest pains, palpitations and leg swelling Denies abdominal pain, nausea, vomiting,diarrhea or constipation.   Denies dysuria, frequency, hesitancy or incontinence. Denies headaches, seizures, numbness, or tingling.  Denies skin break down or rash.        Objective:   Physical Exam Patient alert and oriented and in no cardiopulmonary distress.  HEENT: No facial asymmetry, EOMI, no sinus tenderness,  oropharynx pink and moist.  Neck supple no adenopathy.  Chest: Clear to auscultation bilaterally.  CVS: S1, S2 no murmurs, no S3.  ABD: Soft non tender. Bowel sounds normal.  Ext: No edema  MS: AdequateDecreased ROM spine, shoulders, hips and knees.  Skin: Intact, no ulcerations or rash noted.  Psych: Good eye contact, normal affect. Memory intact not anxious or depressed appearing.  CNS: CN 2-12 intact, power, tone and sensation normal throughout.        Assessment & Plan:

## 2011-11-14 LAB — HEMOGLOBIN A1C
Hgb A1c MFr Bld: 5.8 % — ABNORMAL HIGH (ref <5.7)
Mean Plasma Glucose: 120 mg/dL — ABNORMAL HIGH (ref <117)

## 2011-11-14 MED ORDER — PRAVASTATIN SODIUM 80 MG PO TABS: 80.0000 mg | ORAL_TABLET | Freq: Every day | ORAL | Status: DC

## 2011-11-14 NOTE — Assessment & Plan Note (Signed)
Uncontrolled, low fat diet advised, pt to start pravastatin which is sent in and she is aware

## 2011-11-16 ENCOUNTER — Emergency Department (HOSPITAL_COMMUNITY)
Admission: EM | Admit: 2011-11-16 | Discharge: 2011-11-16 | Disposition: A | Discharge status: Home / Self Care | Payer: Medicare Other | Attending: Emergency Medicine | Admitting: Emergency Medicine

## 2011-11-16 ENCOUNTER — Encounter (HOSPITAL_COMMUNITY): Payer: Self-pay | Admitting: *Deleted

## 2011-11-16 DIAGNOSIS — F319 Bipolar disorder, unspecified: Secondary | ICD-10-CM | POA: Insufficient documentation

## 2011-11-16 DIAGNOSIS — L509 Urticaria, unspecified: Secondary | ICD-10-CM

## 2011-11-16 DIAGNOSIS — I10 Essential (primary) hypertension: Secondary | ICD-10-CM | POA: Insufficient documentation

## 2011-11-16 DIAGNOSIS — Z8679 Personal history of other diseases of the circulatory system: Secondary | ICD-10-CM | POA: Insufficient documentation

## 2011-11-16 DIAGNOSIS — E785 Hyperlipidemia, unspecified: Secondary | ICD-10-CM | POA: Insufficient documentation

## 2011-11-16 DIAGNOSIS — K219 Gastro-esophageal reflux disease without esophagitis: Secondary | ICD-10-CM | POA: Insufficient documentation

## 2011-11-16 DIAGNOSIS — Z79899 Other long term (current) drug therapy: Secondary | ICD-10-CM | POA: Insufficient documentation

## 2011-11-16 DIAGNOSIS — E669 Obesity, unspecified: Secondary | ICD-10-CM | POA: Insufficient documentation

## 2011-11-16 HISTORY — DX: Bipolar disorder, unspecified: F31.9

## 2011-11-16 MED ORDER — PREDNISONE 20 MG PO TABS: 40.0000 mg | ORAL_TABLET | Freq: Every day | ORAL | Status: DC

## 2011-11-16 MED ORDER — PREDNISONE 20 MG PO TABS
40.0000 mg | ORAL_TABLET | Freq: Once | ORAL | Status: AC
  Administered 2011-11-16: 40 mg via ORAL
  Filled 2011-11-16: qty 2

## 2011-11-16 NOTE — Discharge Instructions (Signed)

## 2011-11-16 NOTE — ED Provider Notes (Signed)
History     CSN: [id number] Arrival date & time: 11/16/2011  2:27 AM   First MD Initiated Contact with Patient 11/16/11 0254      Chief Complaint  Patient presents with  . Rash    (Consider location/radiation/quality/duration/timing/severity/associated sxs/prior treatment) HPI Comments: Patient reports that she started to develop a it she rash around her neck and cheeks tonight. She has not started any recent new medications. She denies any itching or rash or hives on her back chest abdomen legs or arms. She does admit that she has been using some new facial products over the last few days. She denies shortness of breath, throat tightening, wheezing or stridor. She was given a new cholesterol medication by her primary care physician 2 days ago but has not yet started them. She has been using Benadryl with some mild relief of her itching. She does not denies any pain. No shortness of breath.   Past Medical History  Diagnosis Date  . Chest pain, unspecified 12/2006    Normal Echo and myovue   . Dyspnea   . Nicotine addiction   . Back pain   . Depression   . Hyperlipidemia   . Hypertension   . Panic attack   . Palpitations   . Cerebrovascular disease     carotid bruit  . Obesity   . Gastroesophageal reflux disease   . Bipolar 1 disorder     Past Surgical History  Procedure Date  . Total abdominal hysterectomy 2002  . Cholecystectomy 2004    Family History  Problem Relation Age of Onset  . Heart failure Mother     History  Substance Use Topics  . Smoking status: Current Everyday Smoker -- 0.5 packs/day for 30 years  . Smokeless tobacco: Never Used  . Alcohol Use: No    OB History    Grav Para Term Preterm Abortions TAB SAB Ect Mult Living                  Review of Systems  Constitutional: Negative.  Negative for fever.  Respiratory: Negative for shortness of breath, wheezing and stridor.   Cardiovascular: Negative for chest pain.  Gastrointestinal:  Negative for nausea and vomiting.  Skin: Positive for rash.    Allergies  Gabapentin; Iohexol; and Vilazodone hcl  Home Medications   Current Outpatient Rx  Name Route Sig Dispense Refill  . ALPRAZOLAM 2 MG PO TABS Oral Take 2 mg by mouth 3 (three) times daily. Take one tab three times a day     . AMLODIPINE BESYLATE 10 MG PO TABS Oral Take 1 tablet (10 mg total) by mouth daily. Take one tab by mouth once daily 30 tablet 3  . ARIPIPRAZOLE 15 MG PO TABS Oral Take 15 mg by mouth daily.      . DULOXETINE HCL 60 MG PO CPEP Oral Take 60 mg by mouth daily.      Marland Kitchen LITHIUM CARBONATE ER 450 MG PO TBCR Oral Take 450 mg by mouth as directed. Take one at bedtime     . METHADONE HCL 10 MG PO TABS Oral Take 1 tablet (10 mg total) by mouth 4 (four) times daily. 120 tablet 0    Dose increase effective 11/13/2011  . PRAVASTATIN SODIUM 80 MG PO TABS Oral Take 1 tablet (80 mg total) by mouth daily. 30 tablet 5  . ZOLPIDEM TARTRATE 10 MG PO TABS Oral Take 10 mg by mouth at bedtime as needed. One tablet by mouth at  bedtime as needed     . PREDNISONE 20 MG PO TABS Oral Take 2 tablets (40 mg total) by mouth daily. 14 tablet 0    BP 140/92  Pulse 83  Temp 97.9 F (36.6 C)  Resp 20  Ht 5\' 8"  (1.727 m)  Wt 205 lb (92.987 kg)  BMI 31.17 kg/m2  SpO2 96%  Physical Exam  Nursing note and vitals reviewed. Constitutional: She appears well-developed and well-nourished.  HENT:  Mouth/Throat: Uvula is midline, oropharynx is clear and moist and mucous membranes are normal.  Eyes: Pupils are equal, round, and reactive to light.  Cardiovascular: Normal rate.   Pulmonary/Chest: Effort normal and breath sounds normal.  Abdominal: Soft.  Skin: Skin is warm. Rash noted. Rash is urticarial.       Hives on cheeks, chin, sides of neck, one isolated hive to upper right anterior chest wall at base of neck, pruritic    ED Course  Procedures (including critical care time)  Labs Reviewed - No data to display No  results found.   1. Hives       MDM   No anaphylaxis.  Pt told to discontinue any new facial products, will start on prednisone.  She can continue benadryl for itching for now.  She should follow up with PCP next week.  i don't think epipen is needed.  No airway compromise.          Gavin Pound. Oletta Lamas, MD 11/16/11 [phone]

## 2011-11-16 NOTE — ED Notes (Addendum)
Pt has rash on neck and face. Rash started this morning after taking meds, has taken Benadryl with minimal relief. Pt also c/o left thumb pain, swelling, and itching.

## 2011-11-20 ENCOUNTER — Telehealth: Payer: Self-pay | Admitting: Family Medicine

## 2011-11-20 NOTE — Telephone Encounter (Signed)
Spoke with pt.  And informed her to try the instructions given to her at the ed and complete the meds given at discharge.

## 2011-11-23 ENCOUNTER — Emergency Department (HOSPITAL_COMMUNITY)
Admission: EM | Admit: 2011-11-23 | Discharge: 2011-11-23 | Disposition: A | Discharge status: Home / Self Care | Payer: Medicare Other | Attending: Emergency Medicine | Admitting: Emergency Medicine

## 2011-11-23 ENCOUNTER — Encounter (HOSPITAL_COMMUNITY): Payer: Self-pay

## 2011-11-23 DIAGNOSIS — Z79899 Other long term (current) drug therapy: Secondary | ICD-10-CM | POA: Insufficient documentation

## 2011-11-23 DIAGNOSIS — F17200 Nicotine dependence, unspecified, uncomplicated: Secondary | ICD-10-CM | POA: Insufficient documentation

## 2011-11-23 DIAGNOSIS — Z8673 Personal history of transient ischemic attack (TIA), and cerebral infarction without residual deficits: Secondary | ICD-10-CM | POA: Insufficient documentation

## 2011-11-23 DIAGNOSIS — F3130 Bipolar disorder, current episode depressed, mild or moderate severity, unspecified: Secondary | ICD-10-CM | POA: Insufficient documentation

## 2011-11-23 DIAGNOSIS — T7840XA Allergy, unspecified, initial encounter: Secondary | ICD-10-CM

## 2011-11-23 DIAGNOSIS — I10 Essential (primary) hypertension: Secondary | ICD-10-CM | POA: Insufficient documentation

## 2011-11-23 DIAGNOSIS — L500 Allergic urticaria: Secondary | ICD-10-CM | POA: Insufficient documentation

## 2011-11-23 DIAGNOSIS — E785 Hyperlipidemia, unspecified: Secondary | ICD-10-CM | POA: Insufficient documentation

## 2011-11-23 DIAGNOSIS — L299 Pruritus, unspecified: Secondary | ICD-10-CM | POA: Insufficient documentation

## 2011-11-23 MED ORDER — PREDNISONE 10 MG PO TABS: 20.0000 mg | ORAL_TABLET | Freq: Every day | ORAL | Status: DC

## 2011-11-23 MED ORDER — PREDNISONE 20 MG PO TABS: 40.0000 mg | ORAL_TABLET | Freq: Every day | ORAL | Status: AC

## 2011-11-23 MED ORDER — DEXAMETHASONE SODIUM PHOSPHATE 4 MG/ML IJ SOLN
10.0000 mg | Freq: Once | INTRAMUSCULAR | Status: AC
  Administered 2011-11-23: 10 mg via INTRAMUSCULAR
  Filled 2011-11-23: qty 3

## 2011-11-23 MED ORDER — HYDROXYZINE HCL 50 MG/ML IM SOLN
25.0000 mg | Freq: Once | INTRAMUSCULAR | Status: AC
  Administered 2011-11-23: 25 mg via INTRAMUSCULAR
  Filled 2011-11-23: qty 1

## 2011-11-23 NOTE — ED Provider Notes (Signed)
History   This chart was scribed for Susan Baker, MD by Charolett Bumpers . The patient was seen in room APA03/APA03 and the patient's care was started at 1:04pm.  CSN: [id number]  Arrival date & time 11/23/11  1213   First MD Initiated Contact with Patient 11/23/11 1302      Chief Complaint  Patient presents with  . Allergic Reaction    (Consider location/radiation/quality/duration/timing/severity/associated sxs/prior treatment) HPI Susan Walker is a 50 y.o. female who presents to the Emergency Department complaining of a intermittent, moderate allergic reaction with an onset of 3 weeks ago. Patient reports having associated symptoms of itching, rash, and throat swelling. This is the second time she has been evaluated in ED for this allergic rxn. After her previous ED visit, she was prescribed Prednisone which relieved the symptoms for that night only before symptoms returned and have remained constant since. The patient also reports currently taking Benadryl, but does not relieve the symptoms. Patient reports no new medications but does report a change in the color and consistency of her water at home.    Past Medical History  Diagnosis Date  . Chest pain, unspecified 12/2006    Normal Echo and myovue   . Dyspnea   . Nicotine addiction   . Back pain   . Depression   . Hyperlipidemia   . Hypertension   . Panic attack   . Palpitations   . Cerebrovascular disease     carotid bruit  . Obesity   . Gastroesophageal reflux disease   . Bipolar 1 disorder     Past Surgical History  Procedure Date  . Total abdominal hysterectomy 2002  . Cholecystectomy 2004    Family History  Problem Relation Age of Onset  . Heart failure Mother     History  Substance Use Topics  . Smoking status: Current Everyday Smoker -- 0.5 packs/day for 30 years  . Smokeless tobacco: Never Used  . Alcohol Use: No    OB History    Grav Para Term Preterm Abortions TAB SAB Ect Mult  Living                  Review of Systems A complete 10 system review of systems was obtained and is otherwise negative except as noted in the HPI and PMH.   Allergies  Gabapentin; Iohexol; and Vilazodone hcl  Home Medications   Current Outpatient Rx  Name Route Sig Dispense Refill  . ALPRAZOLAM 2 MG PO TABS Oral Take 2 mg by mouth 3 (three) times daily. Take one tab three times a day     . AMLODIPINE BESYLATE 10 MG PO TABS Oral Take 1 tablet (10 mg total) by mouth daily. Take one tab by mouth once daily 30 tablet 3  . ARIPIPRAZOLE 15 MG PO TABS Oral Take 15 mg by mouth daily.      . DULOXETINE HCL 60 MG PO CPEP Oral Take 60 mg by mouth daily.      Marland Kitchen LITHIUM CARBONATE ER 450 MG PO TBCR Oral Take 450 mg by mouth as directed. Take one at bedtime     . METHADONE HCL 10 MG PO TABS Oral Take 1 tablet (10 mg total) by mouth 4 (four) times daily. 120 tablet 0    Dose increase effective 11/13/2011  . PRAVASTATIN SODIUM 80 MG PO TABS Oral Take 1 tablet (80 mg total) by mouth daily. 30 tablet 5  . PREDNISONE 20 MG PO TABS Oral Take  2 tablets (40 mg total) by mouth daily. 14 tablet 0  . ZOLPIDEM TARTRATE 10 MG PO TABS Oral Take 10 mg by mouth at bedtime as needed. One tablet by mouth at bedtime as needed       BP 163/116  Pulse 101  Temp(Src) 98.4 F (36.9 C) (Oral)  Resp 20  Ht 5\' 8"  (1.727 m)  Wt 210 lb (95.255 kg)  BMI 31.93 kg/m2  SpO2 100%  Physical Exam  Nursing note and vitals reviewed. Constitutional: She is oriented to person, place, and time. She appears well-developed and well-nourished. No distress.  HENT:  Head: Normocephalic and atraumatic.       Tongue normal.  Eyes: EOM are normal. Pupils are equal, round, and reactive to light.  Neck: Neck supple. No tracheal deviation present.  Cardiovascular: Normal rate and normal heart sounds.   Pulmonary/Chest: Effort normal. No respiratory distress.       Lungs clear. No stridor.   Abdominal: Soft. She exhibits no  distension.  Musculoskeletal: Normal range of motion. She exhibits no edema.  Neurological: She is alert and oriented to person, place, and time. No sensory deficit.  Skin: Skin is warm and dry.       Hives to neck, chest and back   Psychiatric: She has a normal mood and affect. Her behavior is normal.    ED Course  Procedures (including critical care time) DIAGNOSTIC STUDIES: Oxygen Saturation is 100% on room air, normal by my interpretation.    COORDINATION OF CARE:   Labs Reviewed - No data to display No results found.   No diagnosis found.    MDM  I personally performed the services described in this documentation, which was scribed in my presence. The recorded information has been reviewed and considered.  Patient given injection of dexamethasone and vistirl here and will be given prescription for prednisone. She'll also be given a referral to see an allergist     Susan Baker, MD 11/23/11 1339

## 2011-11-23 NOTE — ED Notes (Addendum)
Pt presents with hives to neck, chest, and arms x 2 weeks. Pt states she was seen here 2 weeks ago for same and symptoms have not gone away. No resp distress.

## 2011-11-23 NOTE — ED Notes (Signed)
Pt made a level 3 due to blood pressure.

## 2011-11-27 ENCOUNTER — Telehealth: Payer: Self-pay | Admitting: Family Medicine

## 2011-11-27 NOTE — Telephone Encounter (Signed)
Blood pressure medicine also

## 2011-11-27 NOTE — Telephone Encounter (Signed)
Spoke with pt and informed her that I can not refill prednisone without dr. Rayburn Ma. She also stated that norvasc was filled but that she lost the bottle.

## 2011-12-01 ENCOUNTER — Other Ambulatory Visit: Payer: Self-pay

## 2011-12-01 ENCOUNTER — Telehealth: Payer: Self-pay

## 2011-12-01 MED ORDER — HYDROXYZINE HCL 25 MG PO TABS: 25.0000 mg | ORAL_TABLET | Freq: Three times a day (TID) | ORAL | Status: AC | PRN

## 2011-12-01 MED ORDER — PREDNISONE (PAK) 5 MG PO TABS: 5.0000 mg | ORAL_TABLET | ORAL | Status: DC

## 2011-12-01 NOTE — Telephone Encounter (Signed)
Spoke with pt and let her know of doctor's wishes for her to see and allergist.  She stated that she is not going to do this and that she will speak with Dr. Lodema Hong when she comes in for her appointment on the 4th.

## 2011-12-01 NOTE — Telephone Encounter (Signed)
pls advise and erx prednisone 5 mg dose pack #21 tabs and hydroxyzine 25mg  one 3 times daily as needed #30 refill zero. Let her know she will need an allergist if this continues to establish what she is allergic to, if it is the methadone which she has been on for years will need to see pain specialist to change her to another pain med

## 2011-12-01 NOTE — Telephone Encounter (Signed)
Pt stated in previous call that is was ok to leave information on answering machine.  Left message on machine letting pt know of 2 new prescriptions. Will give a call back to alert her that she needs to be seen by an allergist.

## 2011-12-04 ENCOUNTER — Encounter: Payer: Self-pay | Admitting: Family Medicine

## 2011-12-05 ENCOUNTER — Encounter: Payer: Self-pay | Admitting: Family Medicine

## 2011-12-05 ENCOUNTER — Ambulatory Visit (INDEPENDENT_AMBULATORY_CARE_PROVIDER_SITE_OTHER): Payer: Medicare Other | Admitting: Family Medicine

## 2011-12-05 VITALS — BP 120/70 | HR 122 | Resp 16 | Ht 68.0 in | Wt 219.8 lb

## 2011-12-05 DIAGNOSIS — F17200 Nicotine dependence, unspecified, uncomplicated: Secondary | ICD-10-CM

## 2011-12-05 DIAGNOSIS — L049 Acute lymphadenitis, unspecified: Secondary | ICD-10-CM

## 2011-12-05 DIAGNOSIS — IMO0002 Reserved for concepts with insufficient information to code with codable children: Secondary | ICD-10-CM

## 2011-12-05 DIAGNOSIS — I10 Essential (primary) hypertension: Secondary | ICD-10-CM | POA: Diagnosis not present

## 2011-12-05 DIAGNOSIS — J40 Bronchitis, not specified as acute or chronic: Secondary | ICD-10-CM | POA: Diagnosis not present

## 2011-12-05 MED ORDER — METHADONE HCL 10 MG PO TABS: 10.0000 mg | ORAL_TABLET | Freq: Three times a day (TID) | ORAL | Status: DC

## 2011-12-05 MED ORDER — PENICILLIN V POTASSIUM 500 MG PO TABS: 500.0000 mg | ORAL_TABLET | Freq: Three times a day (TID) | ORAL | Status: AC

## 2011-12-05 NOTE — Patient Instructions (Signed)
F/U in early March  Dose reduction in methadone to 3 times daily starting today.  I do recommend a visit to an allergist as well as a pain specialist, however you refuse. If you have any further problems with pain management, you will have no choice but to see a specialist or do without any medication  Penicillin is sent in for chest congestion

## 2011-12-06 DIAGNOSIS — L049 Acute lymphadenitis, unspecified: Secondary | ICD-10-CM | POA: Insufficient documentation

## 2011-12-06 NOTE — Assessment & Plan Note (Signed)
Acute symptoms antibiotic prescribed

## 2011-12-06 NOTE — Assessment & Plan Note (Signed)
Controlled, no change in medication  

## 2011-12-06 NOTE — Assessment & Plan Note (Signed)
Significant occipital adenitis, if persists after antibiotics, will need surgical eval

## 2011-12-06 NOTE — Assessment & Plan Note (Signed)
Ongoing use, cessation counseling done 

## 2011-12-06 NOTE — Assessment & Plan Note (Signed)
Pain unchanged, pt has severe disc disease, surgery has been recommended, however she refuses, afraid. Dose reduction in methadone.  Any subsequent claims of allergic reaction to the product or non compliance will result in termination of prescription for pain.  Of note pt has refused my recommendations for referral to allergist and pain specialist at this visit

## 2011-12-06 NOTE — Progress Notes (Signed)
Subjective:     Patient ID: Susan Walker, female   DOB: [birth year], 51 y.o.   MRN: [id number]  HPI Pt in with main c/o "allergic reaction"he believes to methaadone and xanax, both of which she has been on for several years.States she had to go to the ED, eyes were swollen, she had been placed on prednisone to address this and has . She had called in requesting change in pain management. I have advised both allergist eval and management by a pain specialist, she refuses both.Stats she has not been taking the medication as she should , sometimes taking less and other times more than how prescribed, I made her aware that this is not only dangerous, but also a breach in pain contract.she is remorseful, but understands clearly that recurrence will result in termination of my services as far as pain management is concerned. C/o tender swelling at the back f her neck x 1 week, reduced in size, no  Fever or chills C/o increased chest congestion as if getting sick. Remains anxious and depressed, but is not currently suicidal or homicidal, managed by psych   Review of Systems See HPI Denies recent fever or chills. Denies sinus pressure, nasal congestion, ear pain or sore throat. Increased chest congestion and cough, ongoing nicotine use. Denies chest pains, palpitations and leg swelling Denies abdominal pain, nausea, vomiting,diarrhea or constipation.   Denies dysuria, frequency, hesitancy or incontinence. Chronic neck and back pain with  limitation in mobility. Denies headaches, seizures, numbness, or tingling.  Denies skin break down or rash.        Objective:   Physical Exam Patient alert and oriented and in no cardiopulmonary distress.  HEENT: No facial asymmetry, EOMI, no sinus tenderness,  oropharynx pink and moist.  Neck decreased ROM, bilateral occipital  adenopathy.  Chest: .Decreased air entry , scattered crackles  CVS: S1, S2 no murmurs, no S3.  ABD: Soft non tender. Bowel  sounds normal.  Ext: No edema  MS: Decreased  ROM spine,adequate in  shoulders, hips and knees.  Skin: Intact, no ulcerations or rash noted.  Psych: Good eye contact, normal affect. Memory intact not anxious or depressed appearing.  CNS: CN 2-12 intact, power, tone and sensation normal throughout.     Assessment:         Plan:

## 2011-12-09 ENCOUNTER — Telehealth: Payer: Self-pay | Admitting: Family Medicine

## 2011-12-09 ENCOUNTER — Other Ambulatory Visit: Payer: Self-pay | Admitting: Family Medicine

## 2011-12-09 NOTE — Telephone Encounter (Signed)
Up the last rx that you gave her she is going to stop taking it

## 2011-12-09 NOTE — Telephone Encounter (Signed)
She is wanting the hydroxyzine sent in to [place] for her itching.  She is now wanting referral to allergist and pain clinic. She wants referral to pain clinic that will write her pain meds and not any kind of accupressure or steroid injections. States she'd rather just "take a pill and be on her way".

## 2011-12-12 ENCOUNTER — Other Ambulatory Visit: Payer: Self-pay | Admitting: Family Medicine

## 2011-12-12 ENCOUNTER — Telehealth: Payer: Self-pay | Admitting: Family Medicine

## 2011-12-12 DIAGNOSIS — G8929 Other chronic pain: Secondary | ICD-10-CM

## 2011-12-12 DIAGNOSIS — T7840XA Allergy, unspecified, initial encounter: Secondary | ICD-10-CM

## 2011-12-12 MED ORDER — HYDROXYZINE HCL 10 MG PO TABS: 10.0000 mg | ORAL_TABLET | Freq: Three times a day (TID) | ORAL | Status: AC | PRN

## 2011-12-12 NOTE — Telephone Encounter (Signed)
She is aware and wants you to know that she is going through withdrawals and is in so much pain and wants to know if you could give her something or recommend something until she is able to get to pain clinic

## 2011-12-12 NOTE — Telephone Encounter (Signed)
pls let pt know hydroxyzine has been prescribed and referrals entered

## 2011-12-12 NOTE — Telephone Encounter (Signed)
pls refer pt to Dr Gerilyn Pilgrim, for chronic pain management, she has cervical disc disease with chronic pain. Has been on methadone in the past , but reports now she is allergic to this

## 2011-12-12 NOTE — Telephone Encounter (Signed)
pls refer to dr hicks in [place] for evaluation for allergies, reports new /severe allregic reaction needing ED eval to unknown allergen, thinks it may be methadone

## 2011-12-12 NOTE — Telephone Encounter (Signed)
See previous message where I spoke with patient

## 2011-12-15 ENCOUNTER — Telehealth: Payer: Self-pay

## 2011-12-15 NOTE — Telephone Encounter (Signed)
Patient called in crying stating Dr Gerilyn Pilgrim couldn't see her until April. She had to stop her methadone because she thought the methadone mixing with the xanax was causing an allergic reaction. She has been off the methadone for a few days and is now having withdrawals. She was very upset when she called and states that she needs something for pain or she'll be forced to go out and buy pain pills on the street and she doesn't want to so that. Said she absolutely cannot take the pain without something. Said if Dr Lodema Hong did not want to be her Dr then send her to somebody that could write her something for pain. Was very upset and crying when I spoke with patient. Per Dr Lodema Hong she is not prescribing anymore narcotics.

## 2011-12-15 NOTE — Telephone Encounter (Signed)
Advise I only suggest ibuprofen 800mg  one twice daily, if she agrees pls send in 60 tabs no refills

## 2011-12-15 NOTE — Telephone Encounter (Signed)
Pt has appt with dr. Willa Rough and also been referred to dr. Gerilyn Pilgrim office. They will call her with appt and time. Pt is aware of this

## 2011-12-15 NOTE — Telephone Encounter (Signed)
States that she can not take Ibuprofen so she took 2 of her old oxycodone that she had left over

## 2011-12-15 NOTE — Telephone Encounter (Signed)
Spoke with pt advised I will not prescribe any pain medication or mental health medication, she understands this clearly. I will attempt to get a sooner appt for pain management

## 2011-12-15 NOTE — Telephone Encounter (Signed)
Message left on answering machine for pt to call

## 2011-12-16 ENCOUNTER — Telehealth: Payer: Self-pay | Admitting: Family Medicine

## 2011-12-16 DIAGNOSIS — R05 Cough: Secondary | ICD-10-CM | POA: Diagnosis not present

## 2011-12-16 DIAGNOSIS — L509 Urticaria, unspecified: Secondary | ICD-10-CM | POA: Diagnosis not present

## 2011-12-17 ENCOUNTER — Telehealth: Payer: Self-pay | Admitting: Family Medicine

## 2011-12-17 NOTE — Telephone Encounter (Signed)
Dr. Willa Rough gave the same medicine that Dr. Lodema Hong gave her for the hives. Please just call her back

## 2011-12-18 NOTE — Telephone Encounter (Signed)
Dr Willa Rough didn't do anything but gave her the same meds that you gave her for itching. Gave RX for OTC zyrtec which she couldn't afford. Wants to know if you've talked to Dr Gerilyn Pilgrim because you said you would call her in something for 1 month only after you talked to him. She sounded very stressed and weapy and that she could not keep taking the methadone because it had her looking like a monster. She cannot take the pain either. Said please do what you can and let her know

## 2011-12-18 NOTE — Telephone Encounter (Signed)
See previous message

## 2011-12-19 NOTE — Telephone Encounter (Signed)
Contacted both Dr Gerilyn Pilgrim and the pt, he recommended clonidine and lomotil for withdrawal symptoms and will put her on a cancellation list. She said she had not taken the methadone for 1 month so she did not need anything for withdrawal and thanked me

## 2011-12-20 ENCOUNTER — Other Ambulatory Visit: Payer: Self-pay

## 2011-12-20 ENCOUNTER — Emergency Department (HOSPITAL_COMMUNITY)
Admission: EM | Admit: 2011-12-20 | Discharge: 2011-12-20 | Disposition: A | Discharge status: Home / Self Care | Payer: Medicare Other | Attending: Emergency Medicine | Admitting: Emergency Medicine

## 2011-12-20 ENCOUNTER — Encounter (HOSPITAL_COMMUNITY): Payer: Self-pay

## 2011-12-20 DIAGNOSIS — F319 Bipolar disorder, unspecified: Secondary | ICD-10-CM | POA: Insufficient documentation

## 2011-12-20 DIAGNOSIS — Z9079 Acquired absence of other genital organ(s): Secondary | ICD-10-CM | POA: Diagnosis not present

## 2011-12-20 DIAGNOSIS — I10 Essential (primary) hypertension: Secondary | ICD-10-CM | POA: Diagnosis not present

## 2011-12-20 DIAGNOSIS — IMO0001 Reserved for inherently not codable concepts without codable children: Secondary | ICD-10-CM | POA: Diagnosis not present

## 2011-12-20 DIAGNOSIS — L509 Urticaria, unspecified: Secondary | ICD-10-CM | POA: Diagnosis not present

## 2011-12-20 DIAGNOSIS — R0989 Other specified symptoms and signs involving the circulatory and respiratory systems: Secondary | ICD-10-CM | POA: Diagnosis not present

## 2011-12-20 DIAGNOSIS — R45851 Suicidal ideations: Secondary | ICD-10-CM

## 2011-12-20 DIAGNOSIS — E785 Hyperlipidemia, unspecified: Secondary | ICD-10-CM | POA: Insufficient documentation

## 2011-12-20 DIAGNOSIS — K219 Gastro-esophageal reflux disease without esophagitis: Secondary | ICD-10-CM | POA: Diagnosis not present

## 2011-12-20 DIAGNOSIS — F17200 Nicotine dependence, unspecified, uncomplicated: Secondary | ICD-10-CM | POA: Insufficient documentation

## 2011-12-20 LAB — RAPID URINE DRUG SCREEN, HOSP PERFORMED
Cocaine: NOT DETECTED
Opiates: NOT DETECTED

## 2011-12-20 LAB — BASIC METABOLIC PANEL
CO2: 24 mEq/L (ref 19–32)
Calcium: 10.1 mg/dL (ref 8.4–10.5)
GFR calc Af Amer: >90 mL/min (ref >90)
GFR calc non Af Amer: >90 mL/min (ref >90)
Sodium: 144 mEq/L (ref 135–145)

## 2011-12-20 LAB — URINALYSIS, ROUTINE W REFLEX MICROSCOPIC
Bilirubin Urine: NEGATIVE
Glucose, UA: NEGATIVE mg/dL
Hgb urine dipstick: NEGATIVE
Specific Gravity, Urine: 1.025 (ref 1.005–1.030)
pH: 6.5 (ref 5.0–8.0)

## 2011-12-20 LAB — DIFFERENTIAL
Basophils Absolute: 0.1 10*3/uL (ref 0.0–0.1)
Eosinophils Absolute: 0.1 10*3/uL (ref 0.0–0.7)
Eosinophils Relative: 2 % (ref 0–5)
Lymphocytes Relative: 32 % (ref 12–46)

## 2011-12-20 LAB — CBC
MCV: 88.3 fL (ref 78.0–100.0)
Platelets: 236 10*3/uL (ref 150–400)
RDW: 13.1 % (ref 11.5–15.5)
WBC: 7.1 10*3/uL (ref 4.0–10.5)

## 2011-12-20 LAB — LITHIUM LEVEL: Lithium Lvl: <0.25 mEq/L — ABNORMAL LOW (ref 0.80–1.40)

## 2011-12-20 MED ORDER — ONDANSETRON HCL 4 MG PO TABS: 4.0000 mg | ORAL_TABLET | Freq: Three times a day (TID) | ORAL | Status: DC | PRN

## 2011-12-20 MED ORDER — CYPROHEPTADINE HCL 4 MG PO TABS
4.0000 mg | ORAL_TABLET | Freq: Three times a day (TID) | ORAL | Status: DC
  Administered 2011-12-20: 4 mg via ORAL
  Filled 2011-12-20 (×3): qty 1

## 2011-12-20 MED ORDER — NICOTINE 21 MG/24HR TD PT24: 21.0000 mg | MEDICATED_PATCH | Freq: Every day | TRANSDERMAL | Status: DC | PRN

## 2011-12-20 MED ORDER — ACETAMINOPHEN 325 MG PO TABS: 650.0000 mg | ORAL_TABLET | ORAL | Status: DC | PRN

## 2011-12-20 MED ORDER — ALPRAZOLAM 0.5 MG PO TABS
2.0000 mg | ORAL_TABLET | Freq: Three times a day (TID) | ORAL | Status: DC
  Administered 2011-12-20: 2 mg via ORAL
  Filled 2011-12-20: qty 4

## 2011-12-20 MED ORDER — OXYCODONE-ACETAMINOPHEN 5-325 MG PO TABS
1.0000 | ORAL_TABLET | ORAL | Status: DC | PRN
  Administered 2011-12-20: 1 via ORAL
  Filled 2011-12-20: qty 1

## 2011-12-20 MED ORDER — PREDNISONE 20 MG PO TABS
20.0000 mg | ORAL_TABLET | Freq: Every day | ORAL | Status: DC
  Administered 2011-12-20: 20 mg via ORAL
  Filled 2011-12-20: qty 1

## 2011-12-20 MED ORDER — SIMVASTATIN 40 MG PO TABS: 40.0000 mg | ORAL_TABLET | Freq: Every day | ORAL | Status: DC

## 2011-12-20 MED ORDER — ARIPIPRAZOLE 10 MG PO TABS
15.0000 mg | ORAL_TABLET | Freq: Every day | ORAL | Status: DC
  Filled 2011-12-20 (×2): qty 1

## 2011-12-20 MED ORDER — DULOXETINE HCL 60 MG PO CPEP
60.0000 mg | ORAL_CAPSULE | Freq: Every day | ORAL | Status: DC
  Filled 2011-12-20 (×2): qty 1

## 2011-12-20 MED ORDER — LITHIUM CARBONATE ER 450 MG PO TBCR: 450.0000 mg | EXTENDED_RELEASE_TABLET | ORAL | Status: DC

## 2011-12-20 MED ORDER — FAMOTIDINE 20 MG PO TABS
20.0000 mg | ORAL_TABLET | Freq: Two times a day (BID) | ORAL | Status: DC
  Administered 2011-12-20: 20 mg via ORAL
  Filled 2011-12-20: qty 1

## 2011-12-20 MED ORDER — AMLODIPINE BESYLATE 5 MG PO TABS
10.0000 mg | ORAL_TABLET | Freq: Every day | ORAL | Status: DC
  Administered 2011-12-20: 10 mg via ORAL
  Filled 2011-12-20: qty 2

## 2011-12-20 MED ORDER — ROSUVASTATIN CALCIUM 10 MG PO TABS
10.0000 mg | ORAL_TABLET | Freq: Every day | ORAL | Status: DC
  Filled 2011-12-20: qty 1

## 2011-12-20 MED ORDER — ZOLPIDEM TARTRATE 5 MG PO TABS: 10.0000 mg | ORAL_TABLET | Freq: Every day | ORAL | Status: DC

## 2011-12-20 NOTE — ED Notes (Signed)
Pt thrashing about in bed and slinging her arms, tearful. States she has had a rash for months and is tired of going from doctor to doctor. States she is full of benadryl and she is not going to take anymore. States she has been on methadone for years and she was tired of taking that also. States she took the rx back to dr simpson and told her she was not going to take it anymore. States dr Lodema Hong is suppose to be getting her into a pain clinic.

## 2011-12-20 NOTE — BH Assessment (Signed)
Assessment Note   Susan Walker is an 51 y.o. female. She presents to the Emergency Department with complaints of hives, which include pain and irritation. She reports that she has taken herself off all of her psychotropic medications: Abilify, Cymbalta and Lithium; but is still taking her 2mg  Xanax QID, which is prescribed by Dr. Mitzi Hansen. She reports that she has been on Methadone for 10 + years, prescribed by Dr. Lodema Hong, her PCP, but about 3 weeks ago, she took herself off of that plus the previous medications noted, because she was breaking out in hives. Patient reports that she stopped the methadone and her hives disappeared; but the next morning when she took the Methadone, her lips, face and neck swelled. She reports a multiple issues, including chest pain, back pain and leg pain. She states that she feels "suicidal" all the time; that is what she tells Dr. Mitzi Hansen, but she would not harm herself. She denies a plan at this time. She reports that she took some children's benadryl (liquid) last Sunday-at first she reported that it was a whole bottle; then she shifted her story to state she took the rest of the bottle, which gave the impression there was not much in it. She said it had no effect on her. It appears she is having a psycho-somatic complaint with the hives, as there appears not to be a medical reason for the hives. Asked her if she had changed any soaps, laundry detergents, etc. But she denied any changes. She persistently asks for pain medications. She was given a percocet earlier, but she states "that will not work on me, I am used to something stronger."  Per her report, Dr. Lodema Hong has referred her to a pain management clinic, but it will be April before she can be admitted to the program. She was offered inpatient hospitalization, to help stabilize her emotionally, as she reports she is depressed and anxious; it was explained to her that she could be medically monitored so that if the   Medications she was given caused her to have hives, then they could give her something that wouldn't make her break out. She refused. She stated that she just couldn't do that right now, and then stated something to the effect of why couldn't we just help her. Told her that her hives more than likely are being caused by stress and anxiety, and until she gets back on her medications, then it is likely this will occur. She stated that she had to get back home to her 6 year old son, who is with his father right now, but she would make arrangements with Dr. Mitzi Hansen about the possibility of being admitted to an inpatient program to address these issues. She was encouraged to re-consider, she stated she wasn't going to harm herself and she then asked for her clothes and more pain medications.   Axis I: Bipolar, Depressed, Panic Disorder without Agoraphobia, R/O opiate abuse Axis II Deferred Axis III Back Pain (see hospital list of medical issues) Axis IV :Stressors: financial, lack of social support,  Axis V 63  Follow up with Dr. Mitzi Hansen.    Past Medical History:  Past Medical History  Diagnosis Date  . Chest pain, unspecified 12/2006    Normal Echo and myovue   . Dyspnea   . Nicotine addiction   . Back pain   . Depression   . Hyperlipidemia   . Hypertension   . Panic attack   . Palpitations   .  Cerebrovascular disease     carotid bruit  . Obesity   . Gastroesophageal reflux disease   . Bipolar 1 disorder     Past Surgical History  Procedure Date  . Total abdominal hysterectomy 2002  . Cholecystectomy 2004    Family History:  Family History  Problem Relation Age of Onset  . Heart failure Mother     Social History:  reports that she has been smoking.  She has never used smokeless tobacco. She reports that she does not drink alcohol or use illicit drugs.  Additional Social History:    Allergies:  Allergies  Allergen Reactions  . Gabapentin Swelling  . Iohexol      Desc:  SWELLING IN THROAT   . Vilazodone Hcl     Home Medications:  Medications Prior to Admission  Medication Dose Route Frequency Provider Last Rate Last Dose  . acetaminophen (TYLENOL) tablet 650 mg  650 mg Oral Q4H PRN Flint Melter, MD      . ALPRAZolam Prudy Feeler) tablet 2 mg  2 mg Oral TID Flint Melter, MD   2 mg at 12/20/11 1511  . amLODipine (NORVASC) tablet 10 mg  10 mg Oral Daily Flint Melter, MD   10 mg at 12/20/11 1512  . ARIPiprazole (ABILIFY) tablet 15 mg  15 mg Oral Daily Flint Melter, MD      . cyproheptadine (PERIACTIN) 4 MG tablet 4 mg  4 mg Oral TID Flint Melter, MD   4 mg at 12/20/11 1512  . DULoxetine (CYMBALTA) DR capsule 60 mg  60 mg Oral Daily Flint Melter, MD      . famotidine (PEPCID) tablet 20 mg  20 mg Oral BID Flint Melter, MD   20 mg at 12/20/11 1512  . lithium carbonate (ESKALITH) CR tablet 450 mg  450 mg Oral UD Flint Melter, MD      . nicotine (NICODERM CQ - dosed in mg/24 hours) patch 21 mg  21 mg Transdermal Daily PRN Flint Melter, MD      . ondansetron [facility]) tablet 4 mg  4 mg Oral Q8H PRN Flint Melter, MD      . oxyCODONE-acetaminophen (PERCOCET) 5-325 MG per tablet 1 tablet  1 tablet Oral Q4H PRN Flint Melter, MD   1 tablet at 12/20/11 1604  . predniSONE (DELTASONE) tablet 20 mg  20 mg Oral Daily Flint Melter, MD   20 mg at 12/20/11 1512  . rosuvastatin (CRESTOR) tablet 10 mg  10 mg Oral q1800 Mady Gemma, PHARMD      . zolpidem [facility]) tablet 10 mg  10 mg Oral QHS Flint Melter, MD      . DISCONTD: simvastatin (ZOCOR) tablet 40 mg  40 mg Oral q1800 Flint Melter, MD       Medications Prior to Admission  Medication Sig Dispense Refill  . amLODipine (NORVASC) 10 MG tablet Take 1 tablet (10 mg total) by mouth daily. Take one tab by mouth once daily  30 tablet  3  . alprazolam (XANAX) 2 MG tablet Take 2 mg by mouth 3 (three) times daily. Take one tab three times a day      . ARIPiprazole (ABILIFY) 15 MG tablet Take 15  mg by mouth daily.        . DULoxetine (CYMBALTA) 60 MG capsule Take 60 mg by mouth daily.        . hydrOXYzine (ATARAX/VISTARIL) 10 MG tablet Take  1 tablet (10 mg total) by mouth 3 (three) times daily as needed for itching.  30 tablet  0  . lithium (ESKALITH) 450 MG CR tablet Take 450 mg by mouth as directed. Take one at bedtime       . pravastatin (PRAVACHOL) 80 MG tablet Take 1 tablet (80 mg total) by mouth daily.  30 tablet  5  . zolpidem (AMBIEN) 10 MG tablet Take 10 mg by mouth at bedtime as needed. One tablet by mouth at bedtime as needed         OB/GYN Status:  No LMP recorded. Patient has had a hysterectomy.  General Assessment Data Location of Assessment: [facility] ACT Assessment: Yes Living Arrangements: Children Can pt return to current living arrangement?: Yes Admission Status: Voluntary Is patient capable of signing voluntary admission?: Yes Transfer from: Acute Hospital Referral Source: MD  Education Status Is patient currently in school?: No  Risk to self Suicidal Ideation: Yes-Currently Present Suicidal Intent: No Is patient at risk for suicide?: No Suicidal Plan?: No Access to Means: No What has been your use of drugs/alcohol within the last 12 months?: Denies illicit use or abuse Previous Attempts/Gestures: No How many times?: 0  Other Self Harm Risks: no Triggers for Past Attempts: None known Intentional Self Injurious Behavior: None Family Suicide History: Unknown Recent stressful life event(s): Financial Problems;Recent negative physical changes (Hives all over her body) Persecutory voices/beliefs?: No Depression: Yes Depression Symptoms: Tearfulness;Loss of interest in usual pleasures;Feeling worthless/self pity;Fatigue Substance abuse history and/or treatment for substance abuse?: No Suicide prevention information given to non-admitted patients: Yes  Risk to Others Homicidal Ideation: No Thoughts of Harm to Others: No Current Homicidal Intent:  No Current Homicidal Plan: No Access to Homicidal Means: No History of harm to others?: No Assessment of Violence: None Noted Does patient have access to weapons?: No Criminal Charges Pending?: No Does patient have a court date: No  Psychosis Hallucinations: None noted Delusions: None noted  Mental Status Report Appear/Hygiene: Other (Comment) (hives) Eye Contact: Good Motor Activity: Freedom of movement;Restlessness Speech: Logical/coherent Level of Consciousness: Alert Mood: Anxious;Depressed Affect: Anxious;Depressed Anxiety Level: Minimal Thought Processes: Coherent Judgement: Other (Comment) (understands issue; unwilling to receive tx at this time) Orientation: Person;Place;Time;Situation Obsessive Compulsive Thoughts/Behaviors: Minimal  Cognitive Functioning Concentration: Decreased Memory: Recent Intact IQ: Average Insight: Fair Impulse Control: Fair Appetite: Good Weight Loss: 0  Weight Gain: 0  Sleep: Decreased Total Hours of Sleep: 6  Vegetative Symptoms: None  Prior Inpatient Therapy Prior Inpatient Therapy: Yes Prior Therapy Dates: unknown Prior Therapy Facilty/Provider(s): outpatient currently with Dr. Mitzi Hansen Reason for Treatment: Bipolar D/O  Prior Outpatient Therapy Prior Outpatient Therapy: Yes Prior Therapy Dates: currently Prior Therapy Facilty/Provider(s): Dr. Mitzi Hansen in [place] Reason for Treatment: Anxiety and Bipolar D/O            Values / Beliefs Cultural Requests During Hospitalization: None Spiritual Requests During Hospitalization: None        Additional Information 1:1 In Past 12 Months?: No CIRT Risk: No Elopement Risk: No Does patient have medical clearance?: Yes     Disposition:  Disposition Disposition of Patient: Treatment offered and refused;Referred to (Dr. Mitzi Hansen; patient given option of Inpatient and [facility]) Type of treatment offered and refused: In-patient;Other (Comment) (and [facility] since traveling to [place]  is difficult, pt refused) Other disposition(s): To current provider Patient referred to: Other (Comment) (Dr. Mitzi Hansen)  On Site Evaluation by:  Dr. Aileen Pilot Reviewed with Physician:  Dr. Robley Fries, [place]in[place] Jewel 12/20/2011  5:02 PM

## 2011-12-20 NOTE — ED Provider Notes (Signed)
After speaking with behavior health person the patient decided that she did not want hospitalization.  She stated she was not going to hurt herself or anyone else.    Benny Lennert, MD 12/20/11 1726

## 2011-12-20 NOTE — ED Notes (Signed)
After triage complete pt states she is Suicidal and feels like she wants to hurt herself. Pt now a level 2 and will follow psych protocol.

## 2011-12-20 NOTE — ED Notes (Signed)
Pt asks for clothing and states she wants to go home. Pt told she needs to be evaluated by act team. Sitter at bedside. nad noted.

## 2011-12-20 NOTE — ED Notes (Signed)
Pt presents with hives to entire body. Pt also states she is SOB but is speaking in full sentences. Pt states she is having chest pain as well. Pt states she was placed on Methadone by Dr Lodema Hong and referred to pain management. Pt states "Im not withdrawing". Pt states yesterday she took a Xanax and Oxycontin yesterday. Pt states "please don't let them send me home, I know Im going to die". Pt tearful and hyperventilating in triage.

## 2011-12-20 NOTE — ED Notes (Signed)
RN at bedside. Sitter still present. NAD noted at this time.

## 2011-12-20 NOTE — ED Provider Notes (Addendum)
History  Scribed for Flint Melter, MD, the patient was seen in room APA16A/APA16A. This chart was scribed by Candelaria Stagers. The patient's care started at 12:59 PM    CSN: [id number]  Arrival date & time 12/20/11  1204   First MD Initiated Contact with Patient 12/20/11 1254      Chief Complaint  Patient presents with  . Medical Clearance  . Shortness of Breath  . Chest Pain  . Headache  . Urticaria     The history is provided by the patient.   Susan Walker is a 51 y.o. female who presents to the Emergency Department complaining of constant pain and hives all over her body that she has been experiencing for several days.  She states that the hives come and go along with suicidal thoughts that she states is associated with hives.  She has no suicidal plans or attempts.  She has taken benadryl for the hives with no relief and states that she drank two bottles of children's benadryl several days ago.  She took OxyContin yesterday with no relief of pain.  She was on methadone for about 10 years and was recently taken off due to allergic reactions of facial swelling.  She has not taken any methadone in three weeks.  She has seen Dr. Lodema Hong for the pain and states that she has an appointment with the pain management clinic three months from now.  She lives with her 71 year old son now and is currently on disability.  Her psychiatrist is Dr. Omelia Blackwater in [place] who she sees every four to five months.  She has h/o back pain, spine problems, and smokes.     Past Medical History  Diagnosis Date  . Chest pain, unspecified 12/2006    Normal Echo and myovue   . Dyspnea   . Nicotine addiction   . Back pain   . Depression   . Hyperlipidemia   . Hypertension   . Panic attack   . Palpitations   . Cerebrovascular disease     carotid bruit  . Obesity   . Gastroesophageal reflux disease   . Bipolar 1 disorder     Past Surgical History  Procedure Date  . Total abdominal hysterectomy  2002  . Cholecystectomy 2004    Family History  Problem Relation Age of Onset  . Heart failure Mother     History  Substance Use Topics  . Smoking status: Current Everyday Smoker -- 0.5 packs/day for 30 years  . Smokeless tobacco: Never Used  . Alcohol Use: No    OB History    Grav Para Term Preterm Abortions TAB SAB Ect Mult Living                  Review of Systems  Musculoskeletal: Positive for myalgias and arthralgias.  Skin: Positive for rash (hives).  Psychiatric/Behavioral: Positive for suicidal ideas.  All other systems reviewed and are negative.    Allergies  Gabapentin; Iohexol; and Vilazodone hcl  Home Medications   Current Outpatient Rx  Name Route Sig Dispense Refill  . AMLODIPINE BESYLATE 10 MG PO TABS Oral Take 1 tablet (10 mg total) by mouth daily. Take one tab by mouth once daily 30 tablet 3  . ALPRAZOLAM 2 MG PO TABS Oral Take 2 mg by mouth 3 (three) times daily. Take one tab three times a day    . ARIPIPRAZOLE 15 MG PO TABS Oral Take 15 mg by mouth daily.      Marland Kitchen  DULOXETINE HCL 60 MG PO CPEP Oral Take 60 mg by mouth daily.      Marland Kitchen HYDROXYZINE HCL 10 MG PO TABS Oral Take 1 tablet (10 mg total) by mouth 3 (three) times daily as needed for itching. 30 tablet 0  . LITHIUM CARBONATE ER 450 MG PO TBCR Oral Take 450 mg by mouth as directed. Take one at bedtime     . PRAVASTATIN SODIUM 80 MG PO TABS Oral Take 1 tablet (80 mg total) by mouth daily. 30 tablet 5  . ZOLPIDEM TARTRATE 10 MG PO TABS Oral Take 10 mg by mouth at bedtime as needed. One tablet by mouth at bedtime as needed       BP 176/103  Pulse 92  Temp(Src) 98.1 F (36.7 C) (Oral)  Resp 22  Ht 5\' 8"  (1.727 m)  Wt 200 lb (90.719 kg)  BMI 30.41 kg/m2  SpO2 100%  Physical Exam  Constitutional: She is oriented to person, place, and time. She appears well-developed and well-nourished. She appears distressed.  HENT:  Head: Normocephalic and atraumatic.  Mouth/Throat: Oropharynx is clear and  moist. No oropharyngeal exudate.       No angio edema.   Eyes: EOM are normal. Right eye exhibits no discharge. Left eye exhibits no discharge.  Neck: Normal range of motion. Neck supple.  Cardiovascular: Normal rate and regular rhythm.  Exam reveals no gallop.   No murmur heard. Pulmonary/Chest: Effort normal and breath sounds normal. She has no wheezes. She has no rales.  Abdominal: There is tenderness (diffuse).  Musculoskeletal: Normal range of motion. She exhibits no edema and no tenderness.           Neurological: She is alert and oriented to person, place, and time.  Skin:       One urticarial lesion on her left breast.  Urticarial lesions on the right of her face.   Psychiatric:       Very anxious.  Cooperative.      ED Course  Procedures   DIAGNOSTIC STUDIES: Oxygen Saturation is 100% on room air, normal by my interpretation.    Date: 12/20/2011  Rate: 83  Rhythm: normal sinus rhythm  QRS Axis: normal  Intervals: normal  ST/T Wave abnormalities: nonspecific T wave changes  Conduction Disutrbances:none  Narrative Interpretation:   Old EKG Reviewed: none available  COORDINATION OF CARE:  12:41PM Ordered: Differential ; Basic metabolic panel ; Drug screen panel, emergency ; Urinalysis with microscopic ; Ethanol  12:55PM Ordered: LITHIUM LEVEL  1:21PM Ordered: predniSONE (DELTASONE) tablet 20 mg ; amLODipine (NORVASC) tablet 10 mg ; DULoxetine (CYMBALTA) DR capsule 60 mg simvastatin (ZOCOR) tablet 40 mg ; ARIPiprazole (ABILIFY) tablet 15 mg ; ALPRAZolam (XANAX) tablet 2 mg ; lithium carbonate (ESKALITH) CR tablet 450 mg ; zolpidem (AMBIEN) tablet 10 mg  1:25PM Ordered: Vital signs with O2 sat q6hours ; Bed rest with bathroom privileges ; Diet regular  Sitter at bedside ; Call MD/PA if: (See Comments) ; acetaminophen (TYLENOL) tablet 650 mg ; nicotine (NICODERM CQ - dosed in mg/24 hours) patch 21 mg ; ondansetron (ZOFRAN) tablet 4 mg ; Full code ; famotidine (PEPCID)  tablet 20 mg ; cyproheptadine (PERIACTIN) 4 MG tablet 4 mg ; oxyCODONE-acetaminophen (PERCOCET) 5-325 MG per tablet 1 tablet  2:21PM Consult for psychiatric evaluation.   ED Plan: ACT evaluation. Meds for itching changed to Prei-actin and Pepcid. Pain (chronic_ treated with Percocet. Holding orders written. Pt remains voluntary at this time. 16:10  Labs Reviewed  BASIC METABOLIC PANEL - Abnormal; Notable for the following:    Potassium 3.3 (*)    All other components within normal limits  URINE RAPID DRUG SCREEN (HOSP PERFORMED) - Abnormal; Notable for the following:    Benzodiazepines POSITIVE (*)    All other components within normal limits  LITHIUM LEVEL - Abnormal; Notable for the following:    Lithium Lvl <0.25 (*) REPEATED TO VERIFY   All other components within normal limits  CBC  DIFFERENTIAL  URINALYSIS, ROUTINE W REFLEX MICROSCOPIC  ETHANOL   No results found.   1. HYPERLIPIDEMIA   2. Suicidal ideation   3. Hives       MDM  Persistant Hives, apparently related to methadone. She has SI and chronic Psychiatric disease.   I personally performed the services described in this documentation, which was scribed in my presence. The recorded information has been reviewed and considered.         Flint Melter, MD 12/20/11 2956  Flint Melter, MD 12/20/11 [phone]

## 2011-12-29 ENCOUNTER — Encounter (HOSPITAL_COMMUNITY): Payer: Self-pay

## 2011-12-29 ENCOUNTER — Inpatient Hospital Stay (HOSPITAL_COMMUNITY)
Admission: EM | Admit: 2011-12-29 | Discharge: 2011-12-31 | DRG: 392 | Disposition: A | Discharge status: Home / Self Care | Payer: Medicare Other | Attending: Internal Medicine | Admitting: Internal Medicine

## 2011-12-29 ENCOUNTER — Inpatient Hospital Stay (HOSPITAL_COMMUNITY): Payer: Medicare Other

## 2011-12-29 ENCOUNTER — Telehealth: Payer: Self-pay | Admitting: Family Medicine

## 2011-12-29 DIAGNOSIS — R079 Chest pain, unspecified: Secondary | ICD-10-CM | POA: Diagnosis not present

## 2011-12-29 DIAGNOSIS — F411 Generalized anxiety disorder: Secondary | ICD-10-CM | POA: Diagnosis not present

## 2011-12-29 DIAGNOSIS — R112 Nausea with vomiting, unspecified: Secondary | ICD-10-CM | POA: Diagnosis present

## 2011-12-29 DIAGNOSIS — IMO0002 Reserved for concepts with insufficient information to code with codable children: Secondary | ICD-10-CM

## 2011-12-29 DIAGNOSIS — R809 Proteinuria, unspecified: Secondary | ICD-10-CM | POA: Diagnosis present

## 2011-12-29 DIAGNOSIS — F410 Panic disorder [episodic paroxysmal anxiety] without agoraphobia: Secondary | ICD-10-CM | POA: Diagnosis present

## 2011-12-29 DIAGNOSIS — F329 Major depressive disorder, single episode, unspecified: Secondary | ICD-10-CM

## 2011-12-29 DIAGNOSIS — M5416 Radiculopathy, lumbar region: Secondary | ICD-10-CM | POA: Diagnosis present

## 2011-12-29 DIAGNOSIS — F319 Bipolar disorder, unspecified: Secondary | ICD-10-CM | POA: Diagnosis present

## 2011-12-29 DIAGNOSIS — L509 Urticaria, unspecified: Secondary | ICD-10-CM | POA: Diagnosis present

## 2011-12-29 DIAGNOSIS — E860 Dehydration: Secondary | ICD-10-CM | POA: Diagnosis not present

## 2011-12-29 DIAGNOSIS — R002 Palpitations: Secondary | ICD-10-CM

## 2011-12-29 DIAGNOSIS — E785 Hyperlipidemia, unspecified: Secondary | ICD-10-CM | POA: Diagnosis present

## 2011-12-29 DIAGNOSIS — R0602 Shortness of breath: Secondary | ICD-10-CM

## 2011-12-29 DIAGNOSIS — M545 Low back pain, unspecified: Secondary | ICD-10-CM | POA: Diagnosis present

## 2011-12-29 DIAGNOSIS — I10 Essential (primary) hypertension: Secondary | ICD-10-CM | POA: Diagnosis present

## 2011-12-29 DIAGNOSIS — D751 Secondary polycythemia: Secondary | ICD-10-CM | POA: Diagnosis present

## 2011-12-29 DIAGNOSIS — F17200 Nicotine dependence, unspecified, uncomplicated: Secondary | ICD-10-CM | POA: Diagnosis present

## 2011-12-29 DIAGNOSIS — N179 Acute kidney failure, unspecified: Secondary | ICD-10-CM | POA: Diagnosis present

## 2011-12-29 DIAGNOSIS — E782 Mixed hyperlipidemia: Secondary | ICD-10-CM | POA: Diagnosis not present

## 2011-12-29 DIAGNOSIS — Z720 Tobacco use: Secondary | ICD-10-CM | POA: Diagnosis present

## 2011-12-29 DIAGNOSIS — A088 Other specified intestinal infections: Principal | ICD-10-CM | POA: Diagnosis present

## 2011-12-29 DIAGNOSIS — G8929 Other chronic pain: Secondary | ICD-10-CM | POA: Diagnosis present

## 2011-12-29 DIAGNOSIS — R197 Diarrhea, unspecified: Secondary | ICD-10-CM | POA: Diagnosis present

## 2011-12-29 DIAGNOSIS — N19 Unspecified kidney failure: Secondary | ICD-10-CM

## 2011-12-29 DIAGNOSIS — N390 Urinary tract infection, site not specified: Secondary | ICD-10-CM | POA: Diagnosis present

## 2011-12-29 DIAGNOSIS — E669 Obesity, unspecified: Secondary | ICD-10-CM

## 2011-12-29 DIAGNOSIS — K3189 Other diseases of stomach and duodenum: Secondary | ICD-10-CM

## 2011-12-29 DIAGNOSIS — L500 Allergic urticaria: Secondary | ICD-10-CM | POA: Diagnosis not present

## 2011-12-29 DIAGNOSIS — R0989 Other specified symptoms and signs involving the circulatory and respiratory systems: Secondary | ICD-10-CM

## 2011-12-29 DIAGNOSIS — R109 Unspecified abdominal pain: Secondary | ICD-10-CM | POA: Diagnosis not present

## 2011-12-29 DIAGNOSIS — E049 Nontoxic goiter, unspecified: Secondary | ICD-10-CM

## 2011-12-29 DIAGNOSIS — E876 Hypokalemia: Secondary | ICD-10-CM | POA: Diagnosis present

## 2011-12-29 DIAGNOSIS — D45 Polycythemia vera: Secondary | ICD-10-CM | POA: Diagnosis present

## 2011-12-29 HISTORY — DX: Tobacco use: Z72.0

## 2011-12-29 LAB — RAPID URINE DRUG SCREEN, HOSP PERFORMED
Benzodiazepines: POSITIVE — AB
Opiates: NOT DETECTED

## 2011-12-29 LAB — CBC
HCT: 47.1 % — ABNORMAL HIGH (ref 36.0–46.0)
MCHC: 36.1 g/dL — ABNORMAL HIGH (ref 30.0–36.0)
MCV: 86.4 fL (ref 78.0–100.0)
Platelets: 259 10*3/uL (ref 150–400)
RDW: 12.8 % (ref 11.5–15.5)
WBC: 10.5 10*3/uL (ref 4.0–10.5)

## 2011-12-29 LAB — MAGNESIUM: Magnesium: 2.5 mg/dL (ref 1.5–2.5)

## 2011-12-29 LAB — COMPREHENSIVE METABOLIC PANEL
AST: 18 U/L (ref 0–37)
Albumin: 4.2 g/dL (ref 3.5–5.2)
BUN: 26 mg/dL — ABNORMAL HIGH (ref 6–23)
Calcium: 10.2 mg/dL (ref 8.4–10.5)
Chloride: 99 mEq/L (ref 96–112)
Creatinine, Ser: 1.93 mg/dL — ABNORMAL HIGH (ref 0.50–1.10)
Total Bilirubin: 0.3 mg/dL (ref 0.3–1.2)
Total Protein: 8 g/dL (ref 6.0–8.3)

## 2011-12-29 LAB — URINALYSIS, ROUTINE W REFLEX MICROSCOPIC
Glucose, UA: 100 mg/dL — AB
Leukocytes, UA: NEGATIVE
Protein, ur: >300 mg/dL — AB
Specific Gravity, Urine: >1.03 — ABNORMAL HIGH (ref 1.005–1.030)

## 2011-12-29 LAB — TSH: TSH: 0.953 u[IU]/mL (ref 0.350–4.500)

## 2011-12-29 LAB — DIFFERENTIAL
Basophils Absolute: 0 10*3/uL (ref 0.0–0.1)
Eosinophils Absolute: 0.1 10*3/uL (ref 0.0–0.7)
Lymphs Abs: 4.9 10*3/uL — ABNORMAL HIGH (ref 0.7–4.0)
Monocytes Absolute: 0.8 10*3/uL (ref 0.1–1.0)
Monocytes Relative: 8 % (ref 3–12)
Neutro Abs: 4.7 10*3/uL (ref 1.7–7.7)

## 2011-12-29 LAB — LIPASE, BLOOD: Lipase: 23 U/L (ref 11–59)

## 2011-12-29 LAB — SEDIMENTATION RATE: Sed Rate: 14 mm/hr (ref 0–22)

## 2011-12-29 LAB — URINE MICROSCOPIC-ADD ON

## 2011-12-29 LAB — T4, FREE: Free T4: 1.65 ng/dL (ref 0.80–1.80)

## 2011-12-29 LAB — PHOSPHORUS: Phosphorus: 4.7 mg/dL — ABNORMAL HIGH (ref 2.3–4.6)

## 2011-12-29 MED ORDER — FAMOTIDINE IN NACL 20-0.9 MG/50ML-% IV SOLN
20.0000 mg | Freq: Once | INTRAVENOUS | Status: AC
  Administered 2011-12-29: 20 mg via INTRAVENOUS
  Filled 2011-12-29: qty 50

## 2011-12-29 MED ORDER — ALPRAZOLAM 1 MG PO TABS
2.0000 mg | ORAL_TABLET | Freq: Three times a day (TID) | ORAL | Status: DC
  Administered 2011-12-29 – 2011-12-31 (×6): 2 mg via ORAL
  Filled 2011-12-29: qty 2
  Filled 2011-12-29: qty 4
  Filled 2011-12-29 (×4): qty 2

## 2011-12-29 MED ORDER — KCL IN DEXTROSE-NACL 40-5-0.9 MEQ/L-%-% IV SOLN
INTRAVENOUS | Status: AC
  Filled 2011-12-29: qty 2000

## 2011-12-29 MED ORDER — SODIUM CHLORIDE 0.9 % IV SOLN
INTRAVENOUS | Status: DC
  Administered 2011-12-29: 17:00:00 via INTRAVENOUS

## 2011-12-29 MED ORDER — ONDANSETRON HCL 4 MG/2ML IJ SOLN: 4.0000 mg | Freq: Four times a day (QID) | INTRAMUSCULAR | Status: DC | PRN

## 2011-12-29 MED ORDER — ALUM & MAG HYDROXIDE-SIMETH 200-200-20 MG/5ML PO SUSP: 30.0000 mL | Freq: Four times a day (QID) | ORAL | Status: DC | PRN

## 2011-12-29 MED ORDER — PANTOPRAZOLE SODIUM 40 MG IV SOLR
40.0000 mg | Freq: Two times a day (BID) | INTRAVENOUS | Status: DC
  Administered 2011-12-29 – 2011-12-31 (×4): 40 mg via INTRAVENOUS
  Filled 2011-12-29 (×4): qty 40

## 2011-12-29 MED ORDER — HYDROXYZINE HCL 50 MG/ML IM SOLN
25.0000 mg | Freq: Once | INTRAMUSCULAR | Status: AC
  Administered 2011-12-29: 25 mg via INTRAMUSCULAR
  Filled 2011-12-29: qty 1

## 2011-12-29 MED ORDER — AMLODIPINE BESYLATE 5 MG PO TABS
10.0000 mg | ORAL_TABLET | Freq: Every day | ORAL | Status: DC
  Administered 2011-12-30 – 2011-12-31 (×2): 10 mg via ORAL
  Filled 2011-12-29 (×2): qty 2

## 2011-12-29 MED ORDER — GUAIFENESIN-DM 100-10 MG/5ML PO SYRP: 5.0000 mL | ORAL_SOLUTION | ORAL | Status: DC | PRN

## 2011-12-29 MED ORDER — ACETAMINOPHEN 325 MG PO TABS: 650.0000 mg | ORAL_TABLET | Freq: Four times a day (QID) | ORAL | Status: DC | PRN

## 2011-12-29 MED ORDER — ONDANSETRON HCL 4 MG PO TABS: 4.0000 mg | ORAL_TABLET | Freq: Four times a day (QID) | ORAL | Status: DC | PRN

## 2011-12-29 MED ORDER — METHYLPREDNISOLONE SODIUM SUCC 125 MG IJ SOLR
125.0000 mg | Freq: Once | INTRAMUSCULAR | Status: AC
  Administered 2011-12-29: 125 mg via INTRAVENOUS
  Filled 2011-12-29: qty 2

## 2011-12-29 MED ORDER — DIPHENHYDRAMINE HCL 50 MG/ML IJ SOLN
12.5000 mg | Freq: Four times a day (QID) | INTRAMUSCULAR | Status: DC | PRN
  Administered 2011-12-30: 12.5 mg via INTRAVENOUS
  Filled 2011-12-29: qty 1

## 2011-12-29 MED ORDER — ALBUTEROL SULFATE (5 MG/ML) 0.5% IN NEBU: 2.5000 mg | INHALATION_SOLUTION | RESPIRATORY_TRACT | Status: DC | PRN

## 2011-12-29 MED ORDER — OXYCODONE-ACETAMINOPHEN 5-325 MG PO TABS
1.0000 | ORAL_TABLET | Freq: Once | ORAL | Status: AC
  Administered 2011-12-29: 1 via ORAL
  Filled 2011-12-29: qty 1

## 2011-12-29 MED ORDER — KCL IN DEXTROSE-NACL 40-5-0.9 MEQ/L-%-% IV SOLN
INTRAVENOUS | Status: DC
  Administered 2011-12-29: 20:00:00 via INTRAVENOUS
  Filled 2011-12-29 (×9): qty 1000

## 2011-12-29 MED ORDER — HYDROMORPHONE HCL PF 1 MG/ML IJ SOLN
1.0000 mg | INTRAMUSCULAR | Status: DC | PRN
  Administered 2011-12-29 – 2011-12-31 (×8): 1 mg via INTRAVENOUS
  Filled 2011-12-29 (×8): qty 1

## 2011-12-29 MED ORDER — DEXTROSE 5 % IV SOLN
1.0000 g | INTRAVENOUS | Status: DC
  Administered 2011-12-29: 20:00:00 via INTRAVENOUS
  Administered 2011-12-30: 1 g via INTRAVENOUS
  Filled 2011-12-29 (×3): qty 10

## 2011-12-29 MED ORDER — CEFTRIAXONE SODIUM 1 G IJ SOLR
INTRAMUSCULAR | Status: AC
  Filled 2011-12-29: qty 10

## 2011-12-29 MED ORDER — THIAMINE HCL 100 MG/ML IJ SOLN
100.0000 mg | Freq: Every day | INTRAMUSCULAR | Status: AC
  Administered 2011-12-29 – 2011-12-30 (×2): 100 mg via INTRAVENOUS
  Filled 2011-12-29 (×2): qty 2

## 2011-12-29 MED ORDER — TRAZODONE HCL 50 MG PO TABS
25.0000 mg | ORAL_TABLET | Freq: Every evening | ORAL | Status: DC | PRN
  Filled 2011-12-29: qty 0.5

## 2011-12-29 MED ORDER — ACETAMINOPHEN 650 MG RE SUPP: 650.0000 mg | Freq: Four times a day (QID) | RECTAL | Status: DC | PRN

## 2011-12-29 NOTE — ED Notes (Signed)
Pt reports has been breaking out in hives for the past 2 1/2 months , reports hasn't eaten in 5 days.  Reports vomiting and diarrhea, SOB, generalized weakness, and left leg pain.  States feet feel hot.

## 2011-12-29 NOTE — ED Provider Notes (Signed)
History    Scribed for Dione Booze, MD, the patient was seen in room APA10/APA10. This chart was scribed by Katha Cabal.   CSN: [id number]  Arrival date & time 12/29/11  1142   First MD Initiated Contact with Patient 12/29/11 1300      Chief Complaint  Patient presents with  . Urticaria  . Emesis  . Weakness    (Consider location/radiation/quality/duration/timing/severity/associated sxs/prior treatment) Patient is a 51 y.o. female presenting with urticaria, vomiting, and weakness. The history is provided by the patient and a friend. No language interpreter was used.  Urticaria This is a recurrent problem. Episode onset: 2 and a half months ago. The problem occurs daily. The problem has been gradually worsening. Associated symptoms comments: Decreased appetite, vomiting, diarrhea. The symptoms are relieved by nothing. Treatments tried: Benadryl. The treatment provided no relief.  Emesis  Associated symptoms include diarrhea.  Weakness The primary symptoms include vomiting.  Additional symptoms include weakness.   Friend reports that patient has not eaten in 5 days.  Patient reports that hives are getting bigger and are diffuse.  Patient states she took Percocet for pain and is unable to get into a pain clinic until April.  Patient has not taken Methadone in over a month. Patient denies drug use.  Patient smokes cigarettes daily.    PCP Syliva Overman, MD, MD  Past Medical History  Diagnosis Date  . Chest pain, unspecified 12/2006    Normal Echo and myovue   . Dyspnea   . Nicotine addiction   . Back pain   . Depression   . Hyperlipidemia   . Hypertension   . Panic attack   . Palpitations   . Cerebrovascular disease     carotid bruit  . Obesity   . Gastroesophageal reflux disease   . Bipolar 1 disorder     Past Surgical History  Procedure Date  . Total abdominal hysterectomy 2002  . Cholecystectomy 2004    Family History  Problem Relation Age of Onset  .  Heart failure Mother     History  Substance Use Topics  . Smoking status: Current Everyday Smoker -- 0.5 packs/day for 30 years  . Smokeless tobacco: Never Used  . Alcohol Use: No    OB History    Grav Para Term Preterm Abortions TAB SAB Ect Mult Living                  Review of Systems  Constitutional: Positive for appetite change.  Gastrointestinal: Positive for vomiting and diarrhea.  Neurological: Positive for weakness.  All other systems reviewed and are negative.    Allergies  Iohexol; Gabapentin; Methadone; and Vilazodone hcl  Home Medications   Current Outpatient Rx  Name Route Sig Dispense Refill  . ALPRAZOLAM 2 MG PO TABS Oral Take 2 mg by mouth 3 (three) times daily. Take one tab three times a day    . AMLODIPINE BESYLATE 10 MG PO TABS Oral Take 1 tablet (10 mg total) by mouth daily. Take one tab by mouth once daily 30 tablet 3  . ARIPIPRAZOLE 15 MG PO TABS Oral Take 15 mg by mouth daily.      . DULOXETINE HCL 60 MG PO CPEP Oral Take 60 mg by mouth daily.      Marland Kitchen LITHIUM CARBONATE ER 450 MG PO TBCR Oral Take 450 mg by mouth as directed. Take one at bedtime     . PRAVASTATIN SODIUM 80 MG PO TABS Oral Take 1 tablet (  80 mg total) by mouth daily. 30 tablet 5  . ZOLPIDEM TARTRATE 10 MG PO TABS Oral Take 10 mg by mouth at bedtime as needed. One tablet by mouth at bedtime as needed       BP 110/83  Pulse 103  Temp(Src) 99.2 F (37.3 C) (Oral)  Resp 16  Ht 5\' 8"  (1.727 m)  Wt 200 lb (90.719 kg)  BMI 30.41 kg/m2  SpO2 97%  Physical Exam  Nursing note and vitals reviewed. Constitutional: She is oriented to person, place, and time. She appears well-developed.       Patient appears uncomfortable.    HENT:  Head: Normocephalic and atraumatic.  Mouth/Throat: Oropharynx is clear and moist.  Eyes: Conjunctivae and EOM are normal.  Neck: Neck supple.  Cardiovascular: Normal rate and regular rhythm.   No murmur heard. Pulmonary/Chest: Effort normal. No  respiratory distress.  Abdominal: Soft. There is no tenderness.  Musculoskeletal: Normal range of motion. She exhibits no edema.  Neurological: She is alert and oriented to person, place, and time.  Skin: Skin is warm and dry. Rash noted. Rash is urticarial.       Diffuse urticarial lesions,   Psychiatric: Her behavior is normal.    ED Course  Procedures (including critical care time)   DIAGNOSTIC STUDIES: Oxygen Saturation is 97% on room air, normal by my interpretation.     COORDINATION OF CARE: 1:06 PM  Physical exam complete.  Will order labs.   1:15 PM  Solu-medrol, Vistaril and Pepcid.     LABS / RADIOLOGY:   Labs Reviewed  CBC - Abnormal; Notable for the following:    RBC 5.45 (*)    Hemoglobin 17.0 (*)    HCT 47.1 (*)    MCHC 36.1 (*)    All other components within normal limits  COMPREHENSIVE METABOLIC PANEL - Abnormal; Notable for the following:    Potassium 3.0 (*)    Glucose, Bld 101 (*)    BUN 26 (*)    Creatinine, Ser 1.93 (*)    GFR calc non Af Amer 29 (*)    GFR calc Af Amer 34 (*)    All other components within normal limits  DIFFERENTIAL  URINALYSIS, ROUTINE W REFLEX MICROSCOPIC  SEDIMENTATION RATE   Results for orders placed during the hospital encounter of 12/29/11  CBC      Component Value Range   WBC 10.5  4.0 - 10.5 (K/uL)   RBC 5.45 (*) 3.87 - 5.11 (MIL/uL)   Hemoglobin 17.0 (*) 12.0 - 15.0 (g/dL)   HCT 24.4 (*) 01.0 - 46.0 (%)   MCV 86.4  78.0 - 100.0 (fL)   MCH 31.2  26.0 - 34.0 (pg)   MCHC 36.1 (*) 30.0 - 36.0 (g/dL)   RDW 27.2  53.6 - 64.4 (%)   Platelets 259  150 - 400 (K/uL)  DIFFERENTIAL      Component Value Range   Neutrophils Relative PENDING  43 - 77 (%)   Neutro Abs PENDING  1.7 - 7.7 (K/uL)   Band Neutrophils PENDING  0 - 10 (%)   Lymphocytes Relative PENDING  12 - 46 (%)   Lymphs Abs PENDING  0.7 - 4.0 (K/uL)   Monocytes Relative PENDING  3 - 12 (%)   Monocytes Absolute PENDING  0.1 - 1.0 (K/uL)   Eosinophils  Relative PENDING  0 - 5 (%)   Eosinophils Absolute PENDING  0.0 - 0.7 (K/uL)   Basophils Relative PENDING  0 - 1 (%)  Basophils Absolute PENDING  0.0 - 0.1 (K/uL)   WBC Morphology PENDING     RBC Morphology PENDING     Smear Review PENDING     nRBC PENDING  0 (/100 WBC)   Metamyelocytes Relative PENDING     Myelocytes PENDING     Promyelocytes Absolute PENDING     Blasts PENDING    COMPREHENSIVE METABOLIC PANEL      Component Value Range   Sodium 141  135 - 145 (mEq/L)   Potassium 3.0 (*) 3.5 - 5.1 (mEq/L)   Chloride 99  96 - 112 (mEq/L)   CO2 26  19 - 32 (mEq/L)   Glucose, Bld 101 (*) 70 - 99 (mg/dL)   BUN 26 (*) 6 - 23 (mg/dL)   Creatinine, Ser 1.61 (*) 0.50 - 1.10 (mg/dL)   Calcium 09.6  8.4 - 10.5 (mg/dL)   Total Protein 8.0  6.0 - 8.3 (g/dL)   Albumin 4.2  3.5 - 5.2 (g/dL)   AST 18  0 - 37 (U/L)   ALT 34  0 - 35 (U/L)   Alkaline Phosphatase 103  39 - 117 (U/L)   Total Bilirubin 0.3  0.3 - 1.2 (mg/dL)   GFR calc non Af Amer 29 (*) >90 (mL/min)   GFR calc Af Amer 34 (*) >90 (mL/min)    No results found.   After IV Solu-Medrol and IV Pepcid and IM hydroxyzine, she states there is no improvement. Workup as soon as significant increase in her BUN and creatinine over the last 9 days in a pattern more consistent with intrinsic renal disease than prerenal causes. I reviewed her old labs, and she has never had an elevated creatinine before. Creatinine level has more than tripled in 3 days. She is admitted for IV hydration and to make sure that she is not going into worse renal failure. I am concerned that the renal failure may be related to an autoimmune process which might also be what is causing her urticaria. Case is discussed with Dr. Sherrie Mustache who agrees to admit the patient.    MDM   Chronic urticaria etiology unclear. She is failed to respond appropriately to antihistamines as an outpatient. She regular dose of steroids and given H1 and H2 blockers in the ED. Laboratory  workup has been ordered.     MEDICATIONS GIVEN IN THE E.D. Scheduled Meds:    . hydrOXYzine  25 mg Intramuscular Once  . methylPREDNISolone sodium succinate  125 mg Intravenous Once   Continuous Infusions:    . famotidine 20 mg (12/29/11 1348)       IMPRESSION: 1. Renal failure   2. Urticaria        I personally performed the services described in this documentation, which was scribed in my presence. The recorded information has been reviewed and considered.   Scribe             Dione Booze, MD 12/29/11 [id number]

## 2011-12-29 NOTE — H&P (Signed)
Susan Walker MRN: [id number] DOB/AGE: 29-Jan-1961 50 y.o. Primary Care Physician:Margaret Lodema Hong, MD, MD Admit date: 12/29/2011 Chief Complaint: Nausea, vomiting, diarrhea, and recurrent hives. HPI: The patient is a 51 year old woman with a past medical history significant for bipolar disorder, panic disorder, and hypertension, who presents to the emergency department with a chief complaint of nausea, vomiting, diarrhea, and recurrent hives. The nausea, vomiting, and diarrhea started last week. The symptoms have been intermittent. For the past 3-4 days, she has been unable to keep any liquids or solids down without vomiting. She has been able to drink ginger ale in the emergency Department without vomiting. She has had diarrhea on and off for the past week, but more over the past 2-3 days. She says that her stools had been loose and yellow to brown in color, until she drank a half of a bottle of Pepto-Bismol. Now her stools have been black to dark brown in color. She was treated with a penicillin approximately 4-6 weeks ago for an upper respiratory infection by her primary care physician. She coffee grounds emesis or evidence of bright red blood in her emesis. She has crampy lower abdominal pain. She had some stinging when she urinates. She has had subjective fever and chills. She has had intermittent hives for the past 2-1/2 months which she initially attributed to methadone. She had been taking methadone for approximately 6 or 7 years until 2 months ago when she stopped it abruptly because she began having recurrent swelling of her eyes and her lips. Concurrently, she developed diffuse urticarial lesions that have been intermittent over the past 2 months even after the discontinuation of methadone.  In the emergency department, she is noted to be afebrile and mildly tachycardic. Her lab data are significant for a hemoglobin of 17.0, potassium of 3.0, BUN of 26, creatinine of 1.93, and a urinalysis that  reveals greater than 1.030 specific gravity, greater than 300 protein, 11-20 WBCs, many squamous cells, and many bacteria. She is being admitted for further evaluation and management.  Past Medical History  Diagnosis Date  . Chest pain, unspecified 12/2006    Normal Echo and myovue   . Dyspnea   . Nicotine addiction   . Back pain     With radiculopathy  . Depression   . Hyperlipidemia   . Hypertension   . Panic attack   . Palpitations   . Cerebrovascular disease     carotid bruit  . Obesity   . Gastroesophageal reflux disease   . Bipolar 1 disorder   . Tobacco abuse     Nicotine addiction  . Urticaria     Past Surgical History  Procedure Date  . Total abdominal hysterectomy 2002  . Cholecystectomy 2004  . Cesarean section     Prior to Admission medications   Medication Sig Start Date End Date Taking? Authorizing Provider  alprazolam Prudy Feeler) 2 MG tablet Take 2 mg by mouth 3 (three) times daily. Take one tab three times a day   Yes Historical Provider, MD  amLODipine (NORVASC) 10 MG tablet Take 1 tablet (10 mg total) by mouth daily. Take one tab by mouth once daily 11/13/11  Yes Syliva Overman, MD  ARIPiprazole (ABILIFY) 15 MG tablet Take 15 mg by mouth daily.      Historical Provider, MD  DULoxetine (CYMBALTA) 60 MG capsule Take 60 mg by mouth daily.      Historical Provider, MD  lithium (ESKALITH) 450 MG CR tablet Take 450 mg by mouth as  directed. Take one at bedtime     Historical Provider, MD  pravastatin (PRAVACHOL) 80 MG tablet Take 1 tablet (80 mg total) by mouth daily. 11/14/11 11/13/12  Syliva Overman, MD  zolpidem (AMBIEN) 10 MG tablet Take 10 mg by mouth at bedtime as needed. One tablet by mouth at bedtime as needed     Historical Provider, MD    Allergies:  Allergies  Allergen Reactions  . Iohexol Anaphylaxis       . Gabapentin Hives, Nausea And Vomiting and Swelling  . Methadone Swelling  . Vilazodone Hcl     Family History  Problem Relation Age of  Onset  . Heart failure Mother   Her father is 8 years of age and he has prostate cancer.  Social History: She is single. She has one 50 year old son. She lives in [place]. She receives disability. She smokes 4 cigarettes daily, decreased from 1-1/2 packs of cigarettes daily. She denies illicit drug use.    ROS: As above in the history of present illness. She denies suicidal ideation. Otherwise review of systems is negative.  PHYSICAL EXAM: Blood pressure 110/80, pulse 104, temperature 98.3 F (36.8 C), temperature source Oral, resp. rate 18, height 5\' 8"  (1.727 m), weight 90.719 kg (200 lb), SpO2 99.00%.  General: Overweight 51 year old after American woman who is currently lying in bed, in no acute distress. HEENT: Head is normocephalic, nontraumatic. Pupils are equal, round, reactive to light. Extraocular movements are intact. Conjunctivae are clear. Sclerae are white. Tympanic membranes are mildly obscured by cerumen bilaterally but no obvious abnormalities. Nasal mucosa is dry with no sinus tenderness. Oropharynx reveals a full set of dentures. Mucous membranes are dry. No posterior exudates or erythema. Neck: Supple, no adenopathy, no thyromegaly, no JVD. Lungs: Decreased breath sounds in the bases, otherwise clear. Heart: S1, S2, with a soft systolic murmur and tachycardia. Abdomen: Hypoactive bowel sounds, soft, mildly diffusely tender, without significant distention. No masses palpated. No palpable hepatosplenomegaly. Her abdomen is obese. GU and rectal are deferred. Extremities: Pedal pulses palpable. No pedal edema or pretibial edema. Skin: Fair to good turgor. There are several circular urticarial lesions on her right thigh, right knee, and left pretibial area. Neurologic/psychological: She is alert and oriented x3. Cranial nerves II through XII are intact. She becomes tearful and frustrated about the recurrent hives. She denies suicidal ideation. She does appear anxious which  she acknowledge is. She acknowledges recent panic attacks.  Basic Metabolic Panel:  Basename 12/29/11 1329  NA 141  K 3.0*  CL 99  CO2 26  GLUCOSE 101*  BUN 26*  CREATININE 1.93*  CALCIUM 10.2  MG --  PHOS --   Liver Function Tests:  Basename 12/29/11 1329  AST 18  ALT 34  ALKPHOS 103  BILITOT 0.3  PROT 8.0  ALBUMIN 4.2   No results found for this basename: LIPASE:2,AMYLASE:2 in the last 72 hours No results found for this basename: AMMONIA:2 in the last 72 hours CBC:  Basename 12/29/11 1329  WBC 10.5  NEUTROABS 4.7  HGB 17.0*  HCT 47.1*  MCV 86.4  PLT 259   Cardiac Enzymes: No results found for this basename: CKTOTAL:3,CKMB:3,CKMBINDEX:3,TROPONINI:3 in the last 72 hours BNP: No results found for this basename: PROBNP:3 in the last 72 hours D-Dimer: No results found for this basename: DDIMER:2 in the last 72 hours CBG: No results found for this basename: GLUCAP:6 in the last 72 hours Hemoglobin A1C: No results found for this basename: HGBA1C in the last 72  hours Fasting Lipid Panel: No results found for this basename: CHOL,HDL,LDLCALC,TRIG,CHOLHDL,LDLDIRECT in the last 72 hours Thyroid Function Tests: No results found for this basename: TSH,T4TOTAL,FREET4,T3FREE,THYROIDAB in the last 72 hours Anemia Panel: No results found for this basename: VITAMINB12,FOLATE,FERRITIN,TIBC,IRON,RETICCTPCT in the last 72 hours Coagulation: No results found for this basename: LABPROT:2,INR:2 in the last 72 hours Urine Drug Screen: Drugs of Abuse     Component Value Date/Time   LABOPIA NONE DETECTED 12/20/2011 1340   COCAINSCRNUR NONE DETECTED 12/20/2011 1340   LABBENZ POSITIVE* 12/20/2011 1340   AMPHETMU NONE DETECTED 12/20/2011 1340   THCU NONE DETECTED 12/20/2011 1340   LABBARB NONE DETECTED 12/20/2011 1340    Alcohol Level: No results found for this basename: ETH:2 in the last 72 hours  Misc. Labs:  Marland Kitchen  No results found for this or any previous visit (from the past  240 hour(s)).   Results for orders placed during the hospital encounter of 12/29/11 (from the past 48 hour(s))  CBC     Status: Abnormal   Collection Time   12/29/11  1:29 PM      Component Value Range Comment   WBC 10.5  4.0 - 10.5 (K/uL)    RBC 5.45 (*) 3.87 - 5.11 (MIL/uL)    Hemoglobin 17.0 (*) 12.0 - 15.0 (g/dL)    HCT 16.1 (*) 09.6 - 46.0 (%)    MCV 86.4  78.0 - 100.0 (fL)    MCH 31.2  26.0 - 34.0 (pg)    MCHC 36.1 (*) 30.0 - 36.0 (g/dL)    RDW 04.5  40.9 - 81.1 (%)    Platelets 259  150 - 400 (K/uL)   DIFFERENTIAL     Status: Abnormal   Collection Time   12/29/11  1:29 PM      Component Value Range Comment   Neutrophils Relative 45  43 - 77 (%)    Lymphocytes Relative 46  12 - 46 (%)    Monocytes Relative 8  3 - 12 (%)    Eosinophils Relative 1  0 - 5 (%)    Basophils Relative 0  0 - 1 (%)    Neutro Abs 4.7  1.7 - 7.7 (K/uL)    Lymphs Abs 4.9 (*) 0.7 - 4.0 (K/uL)    Monocytes Absolute 0.8  0.1 - 1.0 (K/uL)    Eosinophils Absolute 0.1  0.0 - 0.7 (K/uL)    Basophils Absolute 0.0  0.0 - 0.1 (K/uL)    WBC Morphology ATYPICAL LYMPHOCYTES     COMPREHENSIVE METABOLIC PANEL     Status: Abnormal   Collection Time   12/29/11  1:29 PM      Component Value Range Comment   Sodium 141  135 - 145 (mEq/L)    Potassium 3.0 (*) 3.5 - 5.1 (mEq/L)    Chloride 99  96 - 112 (mEq/L)    CO2 26  19 - 32 (mEq/L)    Glucose, Bld 101 (*) 70 - 99 (mg/dL)    BUN 26 (*) 6 - 23 (mg/dL)    Creatinine, Ser 9.14 (*) 0.50 - 1.10 (mg/dL)    Calcium 78.2  8.4 - 10.5 (mg/dL)    Total Protein 8.0  6.0 - 8.3 (g/dL)    Albumin 4.2  3.5 - 5.2 (g/dL)    AST 18  0 - 37 (U/L)    ALT 34  0 - 35 (U/L)    Alkaline Phosphatase 103  39 - 117 (U/L)    Total Bilirubin 0.3  0.3 -  1.2 (mg/dL)    GFR calc non Af Amer 29 (*) >90 (mL/min)    GFR calc Af Amer 34 (*) >90 (mL/min)   SEDIMENTATION RATE     Status: Normal   Collection Time   12/29/11  1:29 PM      Component Value Range Comment   Sed Rate 14  0 - 22  (mm/hr)   URINALYSIS, ROUTINE W REFLEX MICROSCOPIC     Status: Abnormal   Collection Time   12/29/11  4:47 PM      Component Value Range Comment   Color, Urine YELLOW  YELLOW     APPearance CLEAR  CLEAR     Specific Gravity, Urine >1.030 (*) 1.005 - 1.030     pH 5.5  5.0 - 8.0     Glucose, UA 100 (*) NEGATIVE (mg/dL)    Hgb urine dipstick SMALL (*) NEGATIVE     Bilirubin Urine SMALL (*) NEGATIVE     Ketones, ur TRACE (*) NEGATIVE (mg/dL)    Protein, ur >161 (*) NEGATIVE (mg/dL)    Urobilinogen, UA 1.0  0.0 - 1.0 (mg/dL)    Nitrite NEGATIVE  NEGATIVE     Leukocytes, UA NEGATIVE  NEGATIVE    URINE MICROSCOPIC-ADD ON     Status: Abnormal   Collection Time   12/29/11  4:47 PM      Component Value Range Comment   Squamous Epithelial / LPF MANY (*) RARE     WBC, UA 11-20  <3 (WBC/hpf)    RBC / HPF 7-10  <3 (RBC/hpf)    Bacteria, UA MANY (*) RARE     Casts HYALINE CASTS (*) NEGATIVE       Impression:  Active Problems:  BACK PAIN, LUMBAR, WITH RADICULOPATHY  Urticaria  Acute renal failure  Hypokalemia  Tobacco abuse  Hypertension  Nausea and vomiting  Diarrhea  Abdominal pain, chronic, generalized  Panic disorder  Polycythemia, secondary  Dehydration  Proteinuria  UTI (lower urinary tract infection)  1. Nausea, vomiting, and diarrhea. She also has mild diffuse abdominal pain/tenderness. Her symptomatology is suggestive of an acute gastroenteritis. She has also been exposed to an antibiotic over the past month or 2.  2. Recurrent pruritic urticarial lesions. The etiology is unknown. She has been to the emergency department at least a couple of times for treatment. She had been treated with Antihistamines and steroids. She was referred to allergist Dr. Willa Rough who apparently recommended another antihistamine and further followup. The patient appears to be distressed by the recurrence of these hives without an apparent resolution.  3. Dehydration as evident by an elevated  hemoglobin and elevated specific gravity.  4. Acute renal failure. Her BUN and creatinine were within normal limits last month. Her acute liver failure is secondary to dehydration and prerenal azotemia.  5. Hypokalemia. Likely secondary to vomiting and diarrhea.  6. Urinary tract infection. Her urinalysis appears contaminated, however, because of dysuria, she will be treated for a urinary tract infection.  7. Proteinuria. This will be monitored in the setting of urinary tract infection and acute renal failure.  8. Chronic low back pain with radiculopathy. She had been treated with methadone in the past until she discontinued it cold [place]. Her primary care physician, Dr. Lodema Hong, has referred her to Dr. Gerilyn Pilgrim for chronic pain management.  9. Panic disorder/bipolar disorder. She has stopped all of her psychotropic medications with the exception of Xanax. She does not appear to be suicidal.   Plan:  1. The patient  was given 125 mg of Solu-Medrol by the emergency department physician. We will hold on further steroids for now.  2. Will start IV fluid hydration with D5 normal saline. Will add potassium chloride to the IV fluids for supplementation. We'll start IV Protonix. Will order as needed hydromorphone IV and as needed antiemetics. Next  3. We'll order Benadryl IV every 6 hours as needed.  4. Will start Rocephin empirically for the urinary tract infection.  5. Will start a clear liquid diet without advancing for now.  6. For further evaluation, we will order an urine culture, C. difficile PCR, lipase, abdominal x-ray, magnesium, phosphorus, and thyroid function test. 7. Tobacco cessation counseling.      Zeferino Mounts 12/29/2011, 5:44 PM

## 2011-12-29 NOTE — ED Notes (Signed)
Called and gave report to [employer]

## 2011-12-29 NOTE — ED Notes (Signed)
No I&O cath at this time as pt was able to ambulate to the bathroom to void with stand by assistance

## 2011-12-29 NOTE — ED Notes (Signed)
Pt states chronic back and neck pain. Unable to get into pain clinic at this time. Pt states intermittent hives x 2 mo. N/V/D x 5 days. Last vomited yesterday. States last had methadone 2 mo ago after becoming allergic to it. Last had OxyContin 1 week ago.

## 2011-12-30 ENCOUNTER — Telehealth: Payer: Self-pay | Admitting: Family Medicine

## 2011-12-30 DIAGNOSIS — E782 Mixed hyperlipidemia: Secondary | ICD-10-CM | POA: Diagnosis not present

## 2011-12-30 DIAGNOSIS — N179 Acute kidney failure, unspecified: Secondary | ICD-10-CM | POA: Diagnosis not present

## 2011-12-30 DIAGNOSIS — A088 Other specified intestinal infections: Secondary | ICD-10-CM | POA: Diagnosis not present

## 2011-12-30 DIAGNOSIS — L500 Allergic urticaria: Secondary | ICD-10-CM | POA: Diagnosis not present

## 2011-12-30 LAB — CBC
Hemoglobin: 14.5 g/dL (ref 12.0–15.0)
MCH: 30.2 pg (ref 26.0–34.0)
MCV: 86.9 fL (ref 78.0–100.0)
RBC: 4.8 MIL/uL (ref 3.87–5.11)

## 2011-12-30 LAB — COMPREHENSIVE METABOLIC PANEL
AST: 17 U/L (ref 0–37)
Albumin: 3.6 g/dL (ref 3.5–5.2)
Calcium: 9.6 mg/dL (ref 8.4–10.5)
Creatinine, Ser: 0.93 mg/dL (ref 0.50–1.10)
GFR calc non Af Amer: 70 mL/min — ABNORMAL LOW (ref >90)

## 2011-12-30 LAB — CLOSTRIDIUM DIFFICILE BY PCR: Toxigenic C. Difficile by PCR: NEGATIVE

## 2011-12-30 MED ORDER — SODIUM CHLORIDE 0.9 % IJ SOLN
INTRAMUSCULAR | Status: AC
  Administered 2011-12-30: 10 mL
  Filled 2011-12-30: qty 3

## 2011-12-30 MED ORDER — SODIUM CHLORIDE 0.9 % IJ SOLN
INTRAMUSCULAR | Status: AC
  Administered 2011-12-30: 3 mL
  Filled 2011-12-30: qty 3

## 2011-12-30 MED ORDER — POTASSIUM CHLORIDE CRYS ER 20 MEQ PO TBCR
20.0000 meq | EXTENDED_RELEASE_TABLET | Freq: Every day | ORAL | Status: AC
  Administered 2011-12-30: 20 meq via ORAL
  Filled 2011-12-30: qty 1

## 2011-12-30 MED ORDER — SODIUM CHLORIDE 0.9 % IJ SOLN
INTRAMUSCULAR | Status: AC
  Administered 2011-12-30: 08:00:00
  Filled 2011-12-30: qty 3

## 2011-12-30 MED ORDER — POTASSIUM CHLORIDE IN NACL 20-0.9 MEQ/L-% IV SOLN
INTRAVENOUS | Status: DC
  Administered 2011-12-30 – 2011-12-31 (×2): via INTRAVENOUS

## 2011-12-30 NOTE — Telephone Encounter (Signed)
Will forward to Dr. She called yesterday about the same thing but I did not get a chance to call her back

## 2011-12-30 NOTE — Progress Notes (Signed)
UR Chart Review Completed  

## 2011-12-30 NOTE — Progress Notes (Signed)
Subjective: The patient denies nausea and vomiting this morning. She wants to eat. She reports eating 15 containers of sherbet overnight. She has had 4 loose stools overnight including this morning. She denies bright red blood and black tarry stools. She has no crampy abdominal pain. She has not noticed any new hives.  Objective: Vital signs in last 24 hours: Filed Vitals:   12/29/11 1729 12/29/11 1920 12/29/11 2200 12/30/11 0543  BP: 110/80 97/64 114/71 138/78  Pulse: 104 92 93 99  Temp: 98.3 F (36.8 C) 97.8 F (36.6 C) 97.9 F (36.6 C) 97.8 F (36.6 C)  TempSrc: Oral   Oral  Resp: 18 20 20 18   Height:      Weight:      SpO2: 99% 95% 98% 95%    Intake/Output Summary (Last 24 hours) at 12/30/11 1324 Last data filed at 12/30/11 0554  Gross per 24 hour  Intake 1270.83 ml  Output      0 ml  Net 1270.83 ml    Weight change:   Lungs: Clear to auscultation bilaterally. Heart: S1, S2, with soft systolic murmur. Abdomen: Obese, positive bowel sounds, soft, nontender, nondistended. Extremities: No pedal edema. Skin: Resolving urticarial lesions on her legs bilaterally. Neurologic/psychological: She is alert and oriented x3. She is less tearful. Her affect is pleasant this morning.  Lab Results: Basic Metabolic Panel:  Basename 12/30/11 0540 12/29/11 1725 12/29/11 1329  NA 139 -- 141  K 3.7 -- 3.0*  CL 102 -- 99  CO2 25 -- 26  GLUCOSE 162* -- 101*  BUN 27* -- 26*  CREATININE 0.93 -- 1.93*  CALCIUM 9.6 -- 10.2  MG -- 2.5 --  PHOS -- 4.7* --   Liver Function Tests:  Basename 12/30/11 0540 12/29/11 1329  AST 17 18  ALT 27 34  ALKPHOS 93 103  BILITOT 0.2* 0.3  PROT 7.4 8.0  ALBUMIN 3.6 4.2    Basename 12/29/11 1725  LIPASE 23  AMYLASE --   No results found for this basename: AMMONIA:2 in the last 72 hours CBC:  Basename 12/30/11 0540 12/29/11 1329  WBC 10.7* 10.5  NEUTROABS -- 4.7  HGB 14.5 17.0*  HCT 41.7 47.1*  MCV 86.9 86.4  PLT 252 259   Cardiac  Enzymes: No results found for this basename: CKTOTAL:3,CKMB:3,CKMBINDEX:3,TROPONINI:3 in the last 72 hours BNP: No results found for this basename: PROBNP:3 in the last 72 hours D-Dimer: No results found for this basename: DDIMER:2 in the last 72 hours CBG: No results found for this basename: GLUCAP:6 in the last 72 hours Hemoglobin A1C: No results found for this basename: HGBA1C in the last 72 hours Fasting Lipid Panel: No results found for this basename: CHOL,HDL,LDLCALC,TRIG,CHOLHDL,LDLDIRECT in the last 72 hours Thyroid Function Tests:  Basename 12/29/11 1725  TSH 0.953  T4TOTAL --  FREET4 1.65  T3FREE --  THYROIDAB --   Anemia Panel: No results found for this basename: VITAMINB12,FOLATE,FERRITIN,TIBC,IRON,RETICCTPCT in the last 72 hours Coagulation: No results found for this basename: LABPROT:2,INR:2 in the last 72 hours Urine Drug Screen: Drugs of Abuse     Component Value Date/Time   LABOPIA NONE DETECTED 12/29/2011 1756   COCAINSCRNUR NONE DETECTED 12/29/2011 1756   LABBENZ POSITIVE* 12/29/2011 1756   AMPHETMU NONE DETECTED 12/29/2011 1756   THCU NONE DETECTED 12/29/2011 1756   LABBARB NONE DETECTED 12/29/2011 1756    Alcohol Level: No results found for this basename: ETH:2 in the last 72 hours Urinalysis:  Misc. Labs:   Micro: Recent Results (  from the past 240 hour(s))  CLOSTRIDIUM DIFFICILE BY PCR     Status: Normal   Collection Time   12/30/11  5:43 AM      Component Value Range Status Comment   C difficile by pcr NEGATIVE  NEGATIVE  Final     Studies/Results: Dg Abd Acute W/chest  12/29/2011  *RADIOLOGY REPORT*  Clinical Data: Abdominal pain, nausea, vomiting, diarrhea  ACUTE ABDOMEN SERIES (ABDOMEN 2 VIEW & CHEST 1 VIEW)  Comparison: None  Findings: Normal heart size, mediastinal contours, and pulmonary vascularity. Lungs clear. No pleural effusion or pneumothorax. Surgical clips right upper quadrant question cholecystectomy. Air filled nondilated loops of  small bowel in the mid abdomen. No evidence of bowel obstruction, bowel wall thickening, or free intraperitoneal air. Gas within nondistended transverse colon. Multilevel endplate spur formation thoracic spine. No urinary tract calcification.  IMPRESSION: Nonobstructive bowel gas pattern.  Original Report Authenticated By: Lollie Marrow, M.D.    Medications: I have reviewed the patient's current medications.  Assessment: Active Problems:  BACK PAIN, LUMBAR, WITH RADICULOPATHY  Urticaria  Acute renal failure  Hypokalemia  Tobacco abuse  Hypertension  Nausea and vomiting  Diarrhea  Abdominal pain, chronic, generalized  Panic disorder  Polycythemia, secondary  Dehydration  Proteinuria  UTI (lower urinary tract infection)   1. Nausea, vomiting, and diarrhea; generalized abdominal pain. Symptomatology consistent with an acute bout of gastroenteritis. She is improving clinically. C. difficile PCR is negative.  Pyuria, treating his urinary tract infection with Rocephin.  Recurrent urticaria. Etiology is unknown. We'll defer further investigation to patient's primary care physician and allergist.  Acute renal failure and secondary polycythemia, due to dehydration and volume depletion. Resolving with IV fluids.  Hypokalemia. Resolving with supplementation. Her magnesium level is within normal limits.  Hyperglycemia, secondary to dextrose in the IV fluids.  Hypertension. Stable on Norvasc.  Panic disorder. Stable on alprazolam.  Chronic low back pain with radiculopathy. She is being treated with IV and oral opiates.  Tobacco abuse. Advised to quit.  Plan:  Advance diet. Discontinue the dextrose in the IV fluids. Continue supportive treatment.   LOS: 1 day   Catherene Kaleta 12/30/2011, 1:24 PM

## 2011-12-31 DIAGNOSIS — L500 Allergic urticaria: Secondary | ICD-10-CM | POA: Diagnosis not present

## 2011-12-31 DIAGNOSIS — F411 Generalized anxiety disorder: Secondary | ICD-10-CM | POA: Diagnosis not present

## 2011-12-31 DIAGNOSIS — A088 Other specified intestinal infections: Secondary | ICD-10-CM | POA: Diagnosis not present

## 2011-12-31 DIAGNOSIS — N179 Acute kidney failure, unspecified: Secondary | ICD-10-CM | POA: Diagnosis not present

## 2011-12-31 LAB — URINE CULTURE
Colony Count: NO GROWTH
Culture  Setup Time: 201301290535
Culture: NO GROWTH

## 2011-12-31 LAB — BASIC METABOLIC PANEL WITH GFR
BUN: 21 mg/dL (ref 6–23)
CO2: 23 meq/L (ref 19–32)
Calcium: 8.2 mg/dL — ABNORMAL LOW (ref 8.4–10.5)
Chloride: 114 meq/L — ABNORMAL HIGH (ref 96–112)
Creatinine, Ser: 0.68 mg/dL (ref 0.50–1.10)
GFR calc Af Amer: >90 mL/min (ref >90)
GFR calc non Af Amer: >90 mL/min (ref >90)
Glucose, Bld: 93 mg/dL (ref 70–99)
Potassium: 4.2 meq/L (ref 3.5–5.1)
Sodium: 144 meq/L (ref 135–145)

## 2011-12-31 LAB — CBC
HCT: 34.8 % — ABNORMAL LOW (ref 36.0–46.0)
Hemoglobin: 11.9 g/dL — ABNORMAL LOW (ref 12.0–15.0)
MCHC: 34.2 g/dL (ref 30.0–36.0)
RBC: 3.89 MIL/uL (ref 3.87–5.11)
WBC: 11.8 10*3/uL — ABNORMAL HIGH (ref 4.0–10.5)

## 2011-12-31 MED ORDER — OXYCODONE-ACETAMINOPHEN 7.5-325 MG PO TABS: 1.0000 | ORAL_TABLET | ORAL | Status: DC | PRN

## 2011-12-31 MED ORDER — PREDNISONE 20 MG PO TABS: ORAL_TABLET | ORAL | Status: DC

## 2011-12-31 NOTE — Progress Notes (Signed)
Patient to be discharged, discharge instructions given, patient verbalized understanding, Spoke with case management in regards in assisting patient with obtaining medications, patient had already pulled out IV site, Patient left floor via wheelchair to personal vehicle, nursing staff assisted

## 2011-12-31 NOTE — Discharge Summary (Addendum)
Physician Discharge Summary  Susan Walker MRN: [id number] DOB/AGE: 1961/02/28 51 y.o.  PCP: Syliva Overman, MD, MD   Admit date: 12/29/2011 Discharge date: 12/31/2011  Discharge Diagnoses:  1. Nausea, vomiting, diarrhea, and abdominal pain, secondary to an acute viral gastroenteritis. 2. Recurrent hives which resolved during the hospitalization. Further evaluation will be deferred to the patient's primary care physician Dr. Lodema Hong. The patient may benefit from a referral to a dermatologist and/or rheumatologist. 3. Dehydration. 4. Acute renal failure secondary to dehydration/prerenal azotemia. 5. Hypokalemia. 6. Pyuria, treated briefly as a urinary tract infection. 7. Proteinuria. Further outpatient evaluation is warranted.  8. Reported hyperlipidemia. The patient had not been taking Pravachol and therefore it was discontinued. Further management per Dr. Lodema Hong. 9. Panic disorder. Benzodiazepine dependent. 10. Reported history of bipolar disorder. The patient had not been taking lithium, Abilify, or Cymbalta. Therefore, they were all discontinued. Further management per her psychiatrist. 11. Hypertension. Remained stable. 12. Secondary polycythemia secondary to dehydration. Resolved. 13. 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 The patient will be seeing Dr. Gerilyn Pilgrim for chronic pain management issues as referred by her primary care physician. 14. Tobacco abuse. She was advised to quit.    Medication List  As of 12/31/2011 12:20 PM   STOP taking these medications         ABILIFY 15 MG tablet         TAKE these medications         alprazolam 2 MG tablet   Commonly known as: XANAX   Take 2 mg by mouth 3 (three) times daily. Take one tab three times a day      amLODipine 10 MG tablet   Commonly known as: NORVASC   Take 1 tablet (10 mg total) by mouth daily. Take one tab by mouth once daily      oxyCODONE-acetaminophen 7.5-325 MG per tablet   Commonly known as: PERCOCET   Take 1 tablet by mouth every 4 (four) hours as needed for pain.      predniSONE 20 MG tablet   Commonly known as: DELTASONE   TAKE 1 TABLET TWICE DAILY AS NEEDED FOR HIVES.         ASK your doctor about these medications         AMBIEN 10 MG tablet   Generic drug: zolpidem   Take 10 mg by mouth at bedtime as needed. One tablet by mouth at bedtime as needed      DULoxetine 60 MG capsule   Commonly known as: CYMBALTA   Take 60 mg by mouth daily.      lithium carbonate 450 MG CR tablet   Commonly known as: ESKALITH   Take 450 mg by mouth as directed. Take one at bedtime      pravastatin 80 MG tablet   Commonly known as: PRAVACHOL   Take 1 tablet (80 mg total) by mouth daily.            Discharge Condition: Improved and stable.  Disposition: Home or Self Care   Consults: None.   Significant Diagnostic Studies: Dg Abd Acute W/chest  12/29/2011  *RADIOLOGY REPORT*  Clinical Data: Abdominal pain, nausea, vomiting, diarrhea  ACUTE ABDOMEN SERIES (ABDOMEN 2 VIEW & CHEST 1 VIEW)  Comparison: None  Findings: Normal heart size, mediastinal contours, and pulmonary vascularity. Lungs clear. No pleural effusion or pneumothorax. Surgical clips right upper quadrant question cholecystectomy. Air filled nondilated loops of small bowel in the mid abdomen. No evidence of  bowel obstruction, bowel wall thickening, or free intraperitoneal air. Gas within nondistended transverse colon. Multilevel endplate spur formation thoracic spine. No urinary tract calcification.  IMPRESSION: Nonobstructive bowel gas pattern.  Original Report Authenticated By: Lollie Marrow, M.D.     Microbiology: Recent Results (from the past 240 hour(s))  URINE CULTURE     Status: Normal   Collection Time   12/29/11  4:47 PM      Component Value Range Status Comment   Specimen Description URINE, CATHETERIZED   Final    Special Requests NONE   Final    Culture  Setup Time [id number]    Final    Colony Count NO GROWTH   Final    Culture NO GROWTH   Final    Report Status 12/31/2011 FINAL   Final   CLOSTRIDIUM DIFFICILE BY PCR     Status: Normal   Collection Time   12/30/11  5:43 AM      Component Value Range Status Comment   C difficile by pcr NEGATIVE  NEGATIVE  Final      Labs: Results for orders placed during the hospital encounter of 12/29/11 (from the past 48 hour(s))  CBC     Status: Abnormal   Collection Time   12/29/11  1:29 PM      Component Value Range Comment   WBC 10.5  4.0 - 10.5 (K/uL)    RBC 5.45 (*) 3.87 - 5.11 (MIL/uL)    Hemoglobin 17.0 (*) 12.0 - 15.0 (g/dL)    HCT 13.2 (*) 44.0 - 46.0 (%)    MCV 86.4  78.0 - 100.0 (fL)    MCH 31.2  26.0 - 34.0 (pg)    MCHC 36.1 (*) 30.0 - 36.0 (g/dL)    RDW 10.2  72.5 - 36.6 (%)    Platelets 259  150 - 400 (K/uL)   DIFFERENTIAL     Status: Abnormal   Collection Time   12/29/11  1:29 PM      Component Value Range Comment   Neutrophils Relative 45  43 - 77 (%)    Lymphocytes Relative 46  12 - 46 (%)    Monocytes Relative 8  3 - 12 (%)    Eosinophils Relative 1  0 - 5 (%)    Basophils Relative 0  0 - 1 (%)    Neutro Abs 4.7  1.7 - 7.7 (K/uL)    Lymphs Abs 4.9 (*) 0.7 - 4.0 (K/uL)    Monocytes Absolute 0.8  0.1 - 1.0 (K/uL)    Eosinophils Absolute 0.1  0.0 - 0.7 (K/uL)    Basophils Absolute 0.0  0.0 - 0.1 (K/uL)    WBC Morphology ATYPICAL LYMPHOCYTES     COMPREHENSIVE METABOLIC PANEL     Status: Abnormal   Collection Time   12/29/11  1:29 PM      Component Value Range Comment   Sodium 141  135 - 145 (mEq/L)    Potassium 3.0 (*) 3.5 - 5.1 (mEq/L)    Chloride 99  96 - 112 (mEq/L)    CO2 26  19 - 32 (mEq/L)    Glucose, Bld 101 (*) 70 - 99 (mg/dL)    BUN 26 (*) 6 - 23 (mg/dL)    Creatinine, Ser 4.40 (*) 0.50 - 1.10 (mg/dL)    Calcium 34.7  8.4 - 10.5 (mg/dL)    Total Protein 8.0  6.0 - 8.3 (g/dL)    Albumin 4.2  3.5 - 5.2 (g/dL)  AST 18  0 - 37 (U/L)    ALT 34  0 - 35 (U/L)    Alkaline Phosphatase  103  39 - 117 (U/L)    Total Bilirubin 0.3  0.3 - 1.2 (mg/dL)    GFR calc non Af Amer 29 (*) >90 (mL/min)    GFR calc Af Amer 34 (*) >90 (mL/min)   SEDIMENTATION RATE     Status: Normal   Collection Time   12/29/11  1:29 PM      Component Value Range Comment   Sed Rate 14  0 - 22 (mm/hr)   URINALYSIS, ROUTINE W REFLEX MICROSCOPIC     Status: Abnormal   Collection Time   12/29/11  4:47 PM      Component Value Range Comment   Color, Urine YELLOW  YELLOW     APPearance CLEAR  CLEAR     Specific Gravity, Urine >1.030 (*) 1.005 - 1.030     pH 5.5  5.0 - 8.0     Glucose, UA 100 (*) NEGATIVE (mg/dL)    Hgb urine dipstick SMALL (*) NEGATIVE     Bilirubin Urine SMALL (*) NEGATIVE     Ketones, ur TRACE (*) NEGATIVE (mg/dL)    Protein, ur >161 (*) NEGATIVE (mg/dL)    Urobilinogen, UA 1.0  0.0 - 1.0 (mg/dL)    Nitrite NEGATIVE  NEGATIVE     Leukocytes, UA NEGATIVE  NEGATIVE    URINE MICROSCOPIC-ADD ON     Status: Abnormal   Collection Time   12/29/11  4:47 PM      Component Value Range Comment   Squamous Epithelial / LPF MANY (*) RARE     WBC, UA 11-20  <3 (WBC/hpf)    RBC / HPF 7-10  <3 (RBC/hpf)    Bacteria, UA MANY (*) RARE     Casts HYALINE CASTS (*) NEGATIVE    URINE CULTURE     Status: Normal   Collection Time   12/29/11  4:47 PM      Component Value Range Comment   Specimen Description URINE, CATHETERIZED      Special Requests NONE      Culture  Setup Time [id number]      Colony Count NO GROWTH      Culture NO GROWTH      Report Status 12/31/2011 FINAL     LIPASE, BLOOD     Status: Normal   Collection Time   12/29/11  5:25 PM      Component Value Range Comment   Lipase 23  11 - 59 (U/L)   MAGNESIUM     Status: Normal   Collection Time   12/29/11  5:25 PM      Component Value Range Comment   Magnesium 2.5  1.5 - 2.5 (mg/dL)   PHOSPHORUS     Status: Abnormal   Collection Time   12/29/11  5:25 PM      Component Value Range Comment   Phosphorus 4.7 (*) 2.3 - 4.6 (mg/dL)     TSH     Status: Normal   Collection Time   12/29/11  5:25 PM      Component Value Range Comment   TSH 0.953  0.350 - 4.500 (uIU/mL)   T4, FREE     Status: Normal   Collection Time   12/29/11  5:25 PM      Component Value Range Comment   Free T4 1.65  0.80 - 1.80 (ng/dL)   URINE RAPID DRUG SCREEN (  HOSP PERFORMED)     Status: Abnormal   Collection Time   12/29/11  5:56 PM      Component Value Range Comment   Opiates NONE DETECTED  NONE DETECTED     Cocaine NONE DETECTED  NONE DETECTED     Benzodiazepines POSITIVE (*) NONE DETECTED     Amphetamines NONE DETECTED  NONE DETECTED     Tetrahydrocannabinol NONE DETECTED  NONE DETECTED     Barbiturates NONE DETECTED  NONE DETECTED    COMPREHENSIVE METABOLIC PANEL     Status: Abnormal   Collection Time   12/30/11  5:40 AM      Component Value Range Comment   Sodium 139  135 - 145 (mEq/L)    Potassium 3.7  3.5 - 5.1 (mEq/L) DELTA CHECK NOTED   Chloride 102  96 - 112 (mEq/L)    CO2 25  19 - 32 (mEq/L)    Glucose, Bld 162 (*) 70 - 99 (mg/dL)    BUN 27 (*) 6 - 23 (mg/dL)    Creatinine, Ser 1.61  0.50 - 1.10 (mg/dL)    Calcium 9.6  8.4 - 10.5 (mg/dL)    Total Protein 7.4  6.0 - 8.3 (g/dL)    Albumin 3.6  3.5 - 5.2 (g/dL)    AST 17  0 - 37 (U/L)    ALT 27  0 - 35 (U/L)    Alkaline Phosphatase 93  39 - 117 (U/L)    Total Bilirubin 0.2 (*) 0.3 - 1.2 (mg/dL)    GFR calc non Af Amer 70 (*) >90 (mL/min)    GFR calc Af Amer 82 (*) >90 (mL/min)   CBC     Status: Abnormal   Collection Time   12/30/11  5:40 AM      Component Value Range Comment   WBC 10.7 (*) 4.0 - 10.5 (K/uL)    RBC 4.80  3.87 - 5.11 (MIL/uL)    Hemoglobin 14.5  12.0 - 15.0 (g/dL)    HCT 09.6  04.5 - 40.9 (%)    MCV 86.9  78.0 - 100.0 (fL)    MCH 30.2  26.0 - 34.0 (pg)    MCHC 34.8  30.0 - 36.0 (g/dL)    RDW 81.1  91.4 - 78.2 (%)    Platelets 252  150 - 400 (K/uL)   CLOSTRIDIUM DIFFICILE BY PCR     Status: Normal   Collection Time   12/30/11  5:43 AM      Component Value  Range Comment   C difficile by pcr NEGATIVE  NEGATIVE    CBC     Status: Abnormal   Collection Time   12/31/11  4:55 AM      Component Value Range Comment   WBC 11.8 (*) 4.0 - 10.5 (K/uL)    RBC 3.89  3.87 - 5.11 (MIL/uL)    Hemoglobin 11.9 (*) 12.0 - 15.0 (g/dL)    HCT 95.6 (*) 21.3 - 46.0 (%)    MCV 89.5  78.0 - 100.0 (fL)    MCH 30.6  26.0 - 34.0 (pg)    MCHC 34.2  30.0 - 36.0 (g/dL)    RDW 08.6  57.8 - 46.9 (%)    Platelets 189  150 - 400 (K/uL) DELTA CHECK NOTED  BASIC METABOLIC PANEL     Status: Abnormal   Collection Time   12/31/11  4:55 AM      Component Value Range Comment   Sodium 144  135 - 145 (  mEq/L)    Potassium 4.2  3.5 - 5.1 (mEq/L)    Chloride 114 (*) 96 - 112 (mEq/L) DELTA CHECK NOTED   CO2 23  19 - 32 (mEq/L)    Glucose, Bld 93  70 - 99 (mg/dL)    BUN 21  6 - 23 (mg/dL)    Creatinine, Ser 1.61  0.50 - 1.10 (mg/dL)    Calcium 8.2 (*) 8.4 - 10.5 (mg/dL)    GFR calc non Af Amer >90  >90 (mL/min)    GFR calc Af Amer >90  >90 (mL/min)      HPI : The patient is a 51 year old woman with a past medical history significant for bipolar disorder, panic disorder, hypertension, and urticaria. She presented to the emergency department on 12/29/2011 with a chief complaint of nausea, vomiting, diarrhea, generalized abdominal pain, and recurrent hives. In the emergency department, she was noted to be afebrile and mildly tachycardic. Her lab data were significant for a hemoglobin of 17.0, potassium of 3.0, BUN of 26, creatinine of 1.93, and a urinalysis that revealed greater than 1.030 specific gravity, greater than 300 protein, 11-20 WBCs, many squamous cells, and many bacteria. She was admitted for further evaluation and management.  HOSPITAL COURSE: The patient was given IV fluids and 125 mg of Solu-Medrol by the emergency department physician. Apparently, Solu-Medrol was given for treatment of the patient's recurrent urticarial lesions. It was apparent that the patient was  dehydrated as evident by an elevated hemoglobin, elevated urine specific gravity, and acute renal failure. Her renal function had been within normal limits one month ago. She was started on IV fluid hydration. Her serum potassium was supplemented in the IV fluids. Once her nausea and vomiting resolved, she was supplemented orally. Although the urinalysis appeared to be contaminated, she was started empirically on Rocephin because of her symptomatology. For further evaluation, a number of studies were ordered. Her blood magnesium level was within normal limits. Her lipase and liver transaminases were within normal limits. Her urine drug screen was positive only for benzodiazepines for which she takes. C. difficile PCR was negative. The acute abdominal series revealed no signs of obstruction.  The patient improved clinically and symptomatically. It was likely her symptomatology was secondary to a viral gastroenteritis. Her diet was advanced. She tolerated the advancement well. The urticarial lesions seen on her legs completely resolved. Her urine culture had no growth and therefore Rocephin was discontinued at the time of hospital discharge. Her renal function and hemoglobin improved. She remained afebrile and hemodynamically stable. Her white blood cell count was a little elevated, which was thought to be secondary to Solu-Medrol given in the emergency department. Although she became tearful at times, her anxiety disorder and bipolar disorder were stable.  I instructed the patient to discuss a referral to a dermatologist and/or rheumatologist with her primary care physician because of the recurrent herniation area lesions. She voiced understanding. I did give her prescription for when necessary prednisone which appears to help reduce the urticaria and itching.    Discharge Exam: Blood pressure 96/63, pulse 89, temperature 98.2 F (36.8 C), temperature source Oral, resp. rate 20, height 5\' 8"  (1.727 m),  weight 90.719 kg (200 lb), SpO2 97.00%.  Lungs: Clear to auscultation bilaterally. Heart: S1, S2, with no murmurs rubs or gallops. Abdomen: Positive bowel sounds, soft, obese, nontender, nondistended. Extremities: No pedal edema. Neurologic: She is alert and oriented x3. Skin: Resolution of the urticarial lesions on her legs.   Discharge Orders  Future Appointments: Provider: Department: Dept Phone: Center:   01/05/2012 8:45 AM Syliva Overman, MD [facility] [phone] [facility]   02/05/2012 10:30 AM Syliva Overman, MD [facility] [phone] [facility]     Future Orders Please Complete By Expires   Diet - low sodium heart healthy      Increase activity slowly      Discharge instructions      Comments:   DISCUSS A REFERRAL TO A DERMATOLOGIST AND/OR RHEUMATOLOGIST WITH YOUR PRIMARY CARE PHYSICIAN.      Follow-up Information    Follow up with Syliva Overman, MD on 01/05/2012. (AT 8 :45 AM )    Contact information:   [address], [address] [place] [address] [phone]         Discharge time: 45 minutes    Signed: Cheikh Bramble 12/31/2011, 12:20 PM

## 2011-12-31 NOTE — Telephone Encounter (Signed)
Spoke with pt on telephone this morning, advised kidney function looked normal and she should discuss firther with Doc caring for her in the hospital, she was appreciative

## 2011-12-31 NOTE — Telephone Encounter (Signed)
See note

## 2012-01-01 ENCOUNTER — Encounter: Payer: Self-pay | Admitting: Family Medicine

## 2012-01-05 ENCOUNTER — Telehealth: Payer: Self-pay | Admitting: Family Medicine

## 2012-01-05 ENCOUNTER — Ambulatory Visit: Payer: Medicare Other | Admitting: Family Medicine

## 2012-01-05 NOTE — Telephone Encounter (Signed)
Pt should have been seen today for hospital f/u and has not come, she needs tro be seen in the office

## 2012-01-05 NOTE — Telephone Encounter (Signed)
Does she just need OV?

## 2012-01-06 ENCOUNTER — Encounter: Payer: Self-pay | Admitting: Family Medicine

## 2012-01-06 ENCOUNTER — Ambulatory Visit (INDEPENDENT_AMBULATORY_CARE_PROVIDER_SITE_OTHER): Payer: Medicare Other | Admitting: Family Medicine

## 2012-01-06 VITALS — BP 140/82 | HR 92 | Resp 20 | Ht 68.0 in | Wt 217.0 lb

## 2012-01-06 DIAGNOSIS — Z720 Tobacco use: Secondary | ICD-10-CM

## 2012-01-06 DIAGNOSIS — I10 Essential (primary) hypertension: Secondary | ICD-10-CM | POA: Diagnosis not present

## 2012-01-06 DIAGNOSIS — IMO0002 Reserved for concepts with insufficient information to code with codable children: Secondary | ICD-10-CM | POA: Diagnosis not present

## 2012-01-06 DIAGNOSIS — L509 Urticaria, unspecified: Secondary | ICD-10-CM

## 2012-01-06 DIAGNOSIS — L299 Pruritus, unspecified: Secondary | ICD-10-CM | POA: Diagnosis not present

## 2012-01-06 DIAGNOSIS — F329 Major depressive disorder, single episode, unspecified: Secondary | ICD-10-CM

## 2012-01-06 DIAGNOSIS — F17200 Nicotine dependence, unspecified, uncomplicated: Secondary | ICD-10-CM

## 2012-01-06 DIAGNOSIS — F410 Panic disorder [episodic paroxysmal anxiety] without agoraphobia: Secondary | ICD-10-CM

## 2012-01-06 DIAGNOSIS — F3289 Other specified depressive episodes: Secondary | ICD-10-CM

## 2012-01-06 MED ORDER — HYDROXYZINE HCL 10 MG PO TABS: 10.0000 mg | ORAL_TABLET | Freq: Two times a day (BID) | ORAL | Status: AC

## 2012-01-06 MED ORDER — OXYCODONE-ACETAMINOPHEN 10-650 MG PO TABS: 1.0000 | ORAL_TABLET | Freq: Four times a day (QID) | ORAL | Status: DC | PRN

## 2012-01-06 NOTE — Assessment & Plan Note (Signed)
Controlled, no change in medication  

## 2012-01-06 NOTE — Patient Instructions (Signed)
F/u in 4 weeks.  Med is sent in for pain and itching.  You are referred to Dr Scharlene Gloss office.  You ABSOLUTELY NEED to see Dr Omelia Blackwater as soon as possible

## 2012-01-06 NOTE — Telephone Encounter (Signed)
Office visit today

## 2012-01-15 DIAGNOSIS — L509 Urticaria, unspecified: Secondary | ICD-10-CM | POA: Diagnosis not present

## 2012-01-18 NOTE — Assessment & Plan Note (Signed)
Cessation counseling done 

## 2012-01-18 NOTE — Assessment & Plan Note (Signed)
Uncontrolled, needs to f/u with psych

## 2012-01-18 NOTE — Assessment & Plan Note (Signed)
Uncontrolled, i believe that this is more related to uncontrolled anxiety than to a dermatologic condition, however , based on recommendation of al;lergist , will refer to derm

## 2012-01-18 NOTE — Progress Notes (Signed)
  Subjective:    Patient ID: Susan Walker, female    DOB: [birth year], 51 y.o.   MRN: [id number]  HPI Pt in for f/u recent hospitalization. She essentially was admitted for acute gastroenteritis. . She has had significant problems with generalized itching taking her to the ED. 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 She has uncontrolled depression, has not been keeping appts with mental health as she should, but is not suicidal or homicidal   Review of Systems See HPI Denies recent fever or chills. Denies sinus pressure, nasal congestion, ear pain or sore throat. Denies chest congestion, productive cough or wheezing. Denies chest pains, palpitations and leg swelling Denies abdominal pain, nausea, vomiting,diarrhea or constipation.   Denies dysuria, frequency, hesitancy or incontinence.  Denies headaches, seizures, numbness, or tingling.  Denies skin break down or rash.        Objective:   Physical Exam Patient alert and oriented and in no cardiopulmonary distress.Anxious, tearful and depressed  HEENT: No facial asymmetry, EOMI, no sinus tenderness,  oropharynx pink and moist.  Neck decreased ROM  no adenopathy.  Chest: Clear to auscultation bilaterally.Decreased air entry  CVS: S1, S2 no murmurs, no S3.  ABD: Soft non tender. Bowel sounds normal.  Ext: No edema  MS: decreased  ROM spine, shoulders, hips and knees.  Skin: Intact, no ulcerations or rash noted.  Psych: Good eye contact,  Memory impaired, both anxious and  depressed appearing.  CNS: CN 2-12 intact, power, tone and sensation normal throughout.        Assessment & Plan:

## 2012-01-18 NOTE — Assessment & Plan Note (Signed)
Uncontrolled needs to f/u with psych

## 2012-01-18 NOTE — Assessment & Plan Note (Signed)
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 In the past pt has not attempted to o/d and had been assuring me during the visit that she would be responsible with her medication as she had been in the past

## 2012-01-23 NOTE — Telephone Encounter (Signed)
Pt aware.

## 2012-02-02 ENCOUNTER — Other Ambulatory Visit: Payer: Self-pay | Admitting: Family Medicine

## 2012-02-03 ENCOUNTER — Ambulatory Visit (INDEPENDENT_AMBULATORY_CARE_PROVIDER_SITE_OTHER): Payer: Medicare Other | Admitting: Family Medicine

## 2012-02-03 ENCOUNTER — Encounter: Payer: Self-pay | Admitting: Family Medicine

## 2012-02-03 VITALS — BP 118/72 | HR 90 | Resp 18 | Ht 68.0 in | Wt 216.0 lb

## 2012-02-03 DIAGNOSIS — F329 Major depressive disorder, single episode, unspecified: Secondary | ICD-10-CM | POA: Diagnosis not present

## 2012-02-03 DIAGNOSIS — I10 Essential (primary) hypertension: Secondary | ICD-10-CM | POA: Diagnosis not present

## 2012-02-03 DIAGNOSIS — E785 Hyperlipidemia, unspecified: Secondary | ICD-10-CM | POA: Diagnosis not present

## 2012-02-03 DIAGNOSIS — IMO0002 Reserved for concepts with insufficient information to code with codable children: Secondary | ICD-10-CM | POA: Diagnosis not present

## 2012-02-03 DIAGNOSIS — F17200 Nicotine dependence, unspecified, uncomplicated: Secondary | ICD-10-CM

## 2012-02-03 DIAGNOSIS — F3289 Other specified depressive episodes: Secondary | ICD-10-CM

## 2012-02-03 MED ORDER — OXYCODONE-ACETAMINOPHEN 10-650 MG PO TABS: 1.0000 | ORAL_TABLET | Freq: Four times a day (QID) | ORAL | Status: DC | PRN

## 2012-02-03 MED ORDER — AMLODIPINE BESYLATE 10 MG PO TABS: 10.0000 mg | ORAL_TABLET | Freq: Every day | ORAL | Status: DC

## 2012-02-03 MED ORDER — PRAVASTATIN SODIUM 40 MG PO TABS: 40.0000 mg | ORAL_TABLET | Freq: Every evening | ORAL | Status: DC

## 2012-02-03 NOTE — Patient Instructions (Signed)
F/u in 4 weeks.  Call if you need me before  You are better.You still need to see Dr. Omelia Blackwater for treatment of depression.  I will refer you to case worker with [facility], to see if there is help available for you in terms of medication, transport etc.   You need to take medication for cholesterol, this is high, pravastatin has been sent in, take one at bedtime please  No changes in medication for pain as we discussed

## 2012-02-03 NOTE — Assessment & Plan Note (Signed)
Uncontrolled, has no transpt to [facility] [place], will try and involve [facility] services

## 2012-02-03 NOTE — Assessment & Plan Note (Signed)
Controlled, no change in medication  

## 2012-02-03 NOTE — Assessment & Plan Note (Signed)
Uncontrolled, needs to start medication

## 2012-02-05 ENCOUNTER — Ambulatory Visit: Payer: Medicare Other | Admitting: Family Medicine

## 2012-02-15 NOTE — Progress Notes (Signed)
  Subjective:    Patient ID: Susan Walker, female    DOB: Aug 30, 1961, 51 y.o.   MRN: [id number]  HPI The PT is here for follow up and re-evaluation of chronic medical conditions, medication management and review of any available recent lab and radiology data.  Preventive health is updated, specifically  Cancer screening and Immunization.   Questions or concerns regarding consultations or procedures which the PT has had in the interim are  addressed. The PT denies any adverse reactions to current medications since the last visit.  There are no new concerns.  C/o uncontrolled depression, not suicidal or homicidal, just anxious to resume medication     Review of Systems  See HPI Denies recent fever or chills. Denies sinus pressure, nasal congestion, ear pain or sore throat. Denies chest congestion, productive cough or wheezing. Denies chest pains, palpitations and leg swelling Denies abdominal pain, nausea, vomiting,diarrhea or constipation.   Denies dysuria, frequency, hesitancy or incontinence. Denies headaches, seizures, numbness, or tingling.  Denies skin break down or rash.Still experiencing generalized pruritus likely related to uncontrolled anxiety          Objective:   Physical Exam Patient alert and oriented and in no cardiopulmonary distress.  HEENT: No facial asymmetry, EOMI, no sinus tenderness,  oropharynx pink and moist.  Neck decreased ROM, no adenopathy.  Chest: Clear to auscultation bilaterally.Decreased air entry CVS: S1, S2 no murmurs, no S3.  ABD: Soft non tender. Bowel sounds normal.  Ext: No edema  MS: Decreased ROM spine, shoulders, hips and knees.  Skin: Intact, no ulcerations or rash noted.  Psych: Good eye contact, normal affect. Memory impaired anxious and  depressed appearing.  CNS: CN 2-12 intact, power, tone and sensation normal throughout.        Assessment & Plan:

## 2012-02-15 NOTE — Assessment & Plan Note (Signed)
Unchanged , cessation counseling done 

## 2012-02-15 NOTE — Assessment & Plan Note (Addendum)
Unchanged, continue current medicationn

## 2012-02-15 NOTE — Assessment & Plan Note (Signed)
Controlled, no change in medication  

## 2012-03-04 ENCOUNTER — Ambulatory Visit (INDEPENDENT_AMBULATORY_CARE_PROVIDER_SITE_OTHER): Payer: Medicare Other | Admitting: Family Medicine

## 2012-03-04 ENCOUNTER — Encounter: Payer: Self-pay | Admitting: Family Medicine

## 2012-03-04 VITALS — BP 128/86 | HR 99 | Resp 18 | Ht 68.0 in | Wt 217.1 lb

## 2012-03-04 DIAGNOSIS — E785 Hyperlipidemia, unspecified: Secondary | ICD-10-CM

## 2012-03-04 DIAGNOSIS — Z1211 Encounter for screening for malignant neoplasm of colon: Secondary | ICD-10-CM | POA: Diagnosis not present

## 2012-03-04 DIAGNOSIS — L509 Urticaria, unspecified: Secondary | ICD-10-CM

## 2012-03-04 DIAGNOSIS — I10 Essential (primary) hypertension: Secondary | ICD-10-CM

## 2012-03-04 DIAGNOSIS — F329 Major depressive disorder, single episode, unspecified: Secondary | ICD-10-CM

## 2012-03-04 DIAGNOSIS — IMO0002 Reserved for concepts with insufficient information to code with codable children: Secondary | ICD-10-CM

## 2012-03-04 DIAGNOSIS — F17200 Nicotine dependence, unspecified, uncomplicated: Secondary | ICD-10-CM

## 2012-03-04 MED ORDER — OXYCODONE-ACETAMINOPHEN 10-650 MG PO TABS: 1.0000 | ORAL_TABLET | Freq: Four times a day (QID) | ORAL | Status: DC | PRN

## 2012-03-04 NOTE — Patient Instructions (Signed)
F/U end May/ early June  Fasting lipid and cmp asap past due  You need to cut back on cigarettes, you need to stop smoking  You need to make and keep appointment with Dr Omelia Blackwater.  You are being referred for a colonoscopy.  You are being referred to [facility] behavioral health for therapy only

## 2012-03-04 NOTE — Progress Notes (Signed)
  Subjective:    Patient ID: Susan Walker, female    DOB: 1961/09/15, 51 y.o.   MRN: [id number]  HPI The PT is here for follow up and re-evaluation of chronic medical conditions, medication management and review of any available recent lab and radiology data.  Preventive health is updated, specifically  Cancer screening and Immunization.  She is behind in all cancer screening , but refusing immediate referral Still needs to get to psychiatry, and interested in local therapy. The PT denies any adverse reactions to current medications since the last visit.  Reports response to medication for itching on dose by dermatology     Review of Systems See HPI Denies recent fever or chills. Denies sinus pressure, nasal congestion, ear pain or sore throat. Denies chest congestion, productive cough or wheezing. Denies chest pains, palpitations and leg swelling Denies abdominal pain, nausea, vomiting,diarrhea or constipation.   Denies dysuria, frequency, hesitancy or incontinence. Chronic severe neck and back pain Denies headaches, seizures, does have numbness, and  tingling. C/o depression, anxiety and  Insomnia.denies suicidal or homicidal ideation Denies skin break down or rash.        Objective:   Physical Exam Patient alert and oriented and in no cardiopulmonary distress.  HEENT: No facial asymmetry, EOMI, no sinus tenderness,  oropharynx pink and moist.  Neck decreased ROM, no adenopathy.Goiter  Chest: Clear to auscultation bilaterally.Decreased air entry throughout  CVS: S1, S2 no murmurs, no S3.  ABD: Soft non tender. Bowel sounds normal.  Ext: No edema  MS: Adequate though reduced ROM spine, shoulders, hips and knees.  Skin: Intact, no ulcerations or rash noted.  Psych: Good eye contact, normal affect. Memory  Impaired, both  anxious and  depressed appearing.  CNS: CN 2-12 intact, power, tone and sensation normal throughout.        Assessment & Plan:

## 2012-03-06 NOTE — Assessment & Plan Note (Signed)
Hyperlipidemia:Low fat diet discussed and encouraged. Uncontrolled, updated labs prior to return visit

## 2012-03-06 NOTE — Assessment & Plan Note (Signed)
Unchanged, counseled re the need to quit

## 2012-03-06 NOTE — Assessment & Plan Note (Signed)
Uncontrolled, needs to see psychiatry, to start medication

## 2012-03-06 NOTE — Assessment & Plan Note (Signed)
Continue current meds, unchanged

## 2012-03-06 NOTE — Assessment & Plan Note (Signed)
Controlled, no change in medication  

## 2012-03-06 NOTE — Assessment & Plan Note (Signed)
Improved on hydroxyzine

## 2012-03-08 ENCOUNTER — Encounter (INDEPENDENT_AMBULATORY_CARE_PROVIDER_SITE_OTHER): Payer: Self-pay | Admitting: *Deleted

## 2012-03-26 ENCOUNTER — Telehealth: Payer: Self-pay | Admitting: Family Medicine

## 2012-03-26 ENCOUNTER — Other Ambulatory Visit: Payer: Self-pay

## 2012-03-26 DIAGNOSIS — IMO0002 Reserved for concepts with insufficient information to code with codable children: Secondary | ICD-10-CM

## 2012-03-26 MED ORDER — OXYCODONE-ACETAMINOPHEN 10-650 MG PO TABS: 1.0000 | ORAL_TABLET | Freq: Four times a day (QID) | ORAL | Status: AC | PRN

## 2012-03-26 NOTE — Telephone Encounter (Signed)
York Spaniel never mind the message she left.

## 2012-03-31 ENCOUNTER — Telehealth: Payer: Self-pay | Admitting: Family Medicine

## 2012-04-01 ENCOUNTER — Other Ambulatory Visit: Payer: Self-pay

## 2012-04-01 ENCOUNTER — Other Ambulatory Visit: Payer: Self-pay | Admitting: Family Medicine

## 2012-04-01 DIAGNOSIS — Z79899 Other long term (current) drug therapy: Secondary | ICD-10-CM

## 2012-04-01 NOTE — Telephone Encounter (Signed)
Patient will be here this evening to pick up rx

## 2012-04-01 NOTE — Telephone Encounter (Signed)
Noted  

## 2012-04-02 LAB — DRUG SCREEN, URINE
Amphetamine Screen, Ur: POSITIVE — AB
Barbiturate Quant, Ur: NEGATIVE
Cocaine Metabolites: NEGATIVE
Marijuana Metabolite: NEGATIVE

## 2012-04-08 ENCOUNTER — Telehealth: Payer: Self-pay | Admitting: Family Medicine

## 2012-04-08 NOTE — Telephone Encounter (Signed)
Called [facility] and she had not provided her insurance information. Already taken care of

## 2012-04-08 NOTE — Telephone Encounter (Signed)
Has question about bill. Will call [facility] and see why they are not paying

## 2012-04-23 ENCOUNTER — Encounter (HOSPITAL_COMMUNITY): Payer: Self-pay | Admitting: *Deleted

## 2012-04-23 ENCOUNTER — Emergency Department (HOSPITAL_COMMUNITY)
Admission: EM | Admit: 2012-04-23 | Discharge: 2012-04-23 | Disposition: A | Discharge status: Home / Self Care | Payer: Medicare Other | Attending: Emergency Medicine | Admitting: Emergency Medicine

## 2012-04-23 ENCOUNTER — Emergency Department (HOSPITAL_COMMUNITY): Payer: Medicare Other

## 2012-04-23 ENCOUNTER — Telehealth: Payer: Self-pay | Admitting: Family Medicine

## 2012-04-23 DIAGNOSIS — R1031 Right lower quadrant pain: Secondary | ICD-10-CM | POA: Diagnosis not present

## 2012-04-23 DIAGNOSIS — R109 Unspecified abdominal pain: Secondary | ICD-10-CM | POA: Diagnosis not present

## 2012-04-23 DIAGNOSIS — N390 Urinary tract infection, site not specified: Secondary | ICD-10-CM | POA: Diagnosis not present

## 2012-04-23 DIAGNOSIS — I10 Essential (primary) hypertension: Secondary | ICD-10-CM | POA: Insufficient documentation

## 2012-04-23 DIAGNOSIS — F319 Bipolar disorder, unspecified: Secondary | ICD-10-CM | POA: Diagnosis not present

## 2012-04-23 DIAGNOSIS — E785 Hyperlipidemia, unspecified: Secondary | ICD-10-CM | POA: Diagnosis not present

## 2012-04-23 DIAGNOSIS — Z8679 Personal history of other diseases of the circulatory system: Secondary | ICD-10-CM | POA: Insufficient documentation

## 2012-04-23 DIAGNOSIS — K625 Hemorrhage of anus and rectum: Secondary | ICD-10-CM | POA: Insufficient documentation

## 2012-04-23 LAB — COMPREHENSIVE METABOLIC PANEL WITH GFR
ALT: 15 U/L (ref 0–35)
AST: 14 U/L (ref 0–37)
Albumin: 4.3 g/dL (ref 3.5–5.2)
Alkaline Phosphatase: 117 U/L (ref 39–117)
BUN: 12 mg/dL (ref 6–23)
CO2: 25 meq/L (ref 19–32)
Calcium: 9.9 mg/dL (ref 8.4–10.5)
Chloride: 107 meq/L (ref 96–112)
Creatinine, Ser: 0.69 mg/dL (ref 0.50–1.10)
GFR calc Af Amer: >90 mL/min
GFR calc non Af Amer: >90 mL/min
Glucose, Bld: 89 mg/dL (ref 70–99)
Potassium: 3.4 meq/L — ABNORMAL LOW (ref 3.5–5.1)
Sodium: 143 meq/L (ref 135–145)
Total Bilirubin: 0.3 mg/dL (ref 0.3–1.2)
Total Protein: 7.9 g/dL (ref 6.0–8.3)

## 2012-04-23 LAB — DIFFERENTIAL
Basophils Absolute: 0 10*3/uL (ref 0.0–0.1)
Basophils Relative: 1 % (ref 0–1)
Eosinophils Absolute: 0.1 10*3/uL (ref 0.0–0.7)
Eosinophils Relative: 1 % (ref 0–5)
Lymphocytes Relative: 37 % (ref 12–46)
Lymphs Abs: 2.8 10*3/uL (ref 0.7–4.0)
Monocytes Absolute: 0.5 10*3/uL (ref 0.1–1.0)
Monocytes Relative: 7 % (ref 3–12)
Neutro Abs: 4.1 10*3/uL (ref 1.7–7.7)
Neutrophils Relative %: 54 % (ref 43–77)

## 2012-04-23 LAB — CBC
HCT: 39.4 % (ref 36.0–46.0)
MCV: 88.5 fL (ref 78.0–100.0)
RDW: 13 % (ref 11.5–15.5)
WBC: 7.5 10*3/uL (ref 4.0–10.5)

## 2012-04-23 LAB — LIPASE, BLOOD: Lipase: 20 U/L (ref 11–59)

## 2012-04-23 LAB — OCCULT BLOOD, POC DEVICE: Fecal Occult Bld: NEGATIVE

## 2012-04-23 LAB — URINE MICROSCOPIC-ADD ON

## 2012-04-23 LAB — URINALYSIS, ROUTINE W REFLEX MICROSCOPIC
Protein, ur: NEGATIVE mg/dL
Urobilinogen, UA: 0.2 mg/dL (ref 0.0–1.0)

## 2012-04-23 MED ORDER — SODIUM CHLORIDE 0.9 % IV SOLN: INTRAVENOUS | Status: DC

## 2012-04-23 MED ORDER — ONDANSETRON HCL 8 MG PO TABS: 8.0000 mg | ORAL_TABLET | ORAL | Status: AC | PRN

## 2012-04-23 MED ORDER — ONDANSETRON HCL 4 MG/2ML IJ SOLN
4.0000 mg | Freq: Once | INTRAMUSCULAR | Status: AC
  Administered 2012-04-23: 4 mg via INTRAVENOUS
  Filled 2012-04-23: qty 2

## 2012-04-23 MED ORDER — OXYCODONE-ACETAMINOPHEN 5-325 MG PO TABS: 1.0000 | ORAL_TABLET | Freq: Four times a day (QID) | ORAL | Status: AC | PRN

## 2012-04-23 MED ORDER — SODIUM CHLORIDE 0.9 % IV BOLUS (SEPSIS)
1000.0000 mL | Freq: Once | INTRAVENOUS | Status: AC
  Administered 2012-04-23: 1000 mL via INTRAVENOUS

## 2012-04-23 MED ORDER — HYDROMORPHONE HCL PF 1 MG/ML IJ SOLN
1.0000 mg | Freq: Once | INTRAMUSCULAR | Status: AC
  Administered 2012-04-23: 1 mg via INTRAVENOUS
  Filled 2012-04-23: qty 1

## 2012-04-23 MED ORDER — NITROFURANTOIN MONOHYD MACRO 100 MG PO CAPS: 100.0000 mg | ORAL_CAPSULE | Freq: Two times a day (BID) | ORAL | Status: AC

## 2012-04-23 MED ORDER — DIPHENOXYLATE-ATROPINE 2.5-0.025 MG PO TABS: 1.0000 | ORAL_TABLET | Freq: Four times a day (QID) | ORAL | Status: AC | PRN

## 2012-04-23 MED ORDER — DEXTROSE 5 % IV SOLN
1.0000 g | Freq: Once | INTRAVENOUS | Status: AC
  Administered 2012-04-23: 1 g via INTRAVENOUS
  Filled 2012-04-23: qty 10

## 2012-04-23 NOTE — ED Provider Notes (Signed)
This chart was scribed for Donnetta Hutching, MD by Wallis Mart. The patient was seen in room APA04/APA04 and the patient's care was started at 11:05 AM.   CSN: [id number]  Arrival date & time 04/23/12  1032   First MD Initiated Contact with Patient 04/23/12 1050      Chief Complaint  Patient presents with  . Rectal Bleeding    (Consider location/radiation/quality/duration/timing/severity/associated sxs/prior treatment) Patient is a 51 y.o. female presenting with hematochezia.  Rectal Bleeding  The current episode started more than 2 weeks ago. The onset was gradual. The problem occurs occasionally. The problem has been gradually worsening. The pain is moderate. There was no prior successful therapy. Associated symptoms include abdominal pain and diarrhea.    Susan Walker is a 51 y.o. female who presents to the Emergency Department complaining of gradual onset, persistence of constant, gradually worsening, moderate to severe abdominal pain.  Pt reports chronic abdominal pain for the past several months, but it has worsened today. Pt c/o rectal bleeding for the past 3 weeks, but denies blood in stool.  When asked to quantify the amount of blood, pt states it's "a lot" but the amount varies each time.  Pt saw her PCP and was dx'd with hemorrhoids 3 weeks ago. There are no other associated symptoms and no other alleviating or aggravating factors.   PCP Dr Lodema Hong  Past Medical History  Diagnosis Date  . Chest pain, unspecified 12/2006    Normal Echo and myovue   . Dyspnea   . Nicotine addiction   . Back pain     With radiculopathy  . Depression   . Hyperlipidemia   . Hypertension   . Panic attack   . Palpitations   . Cerebrovascular disease     carotid bruit  . Obesity   . Gastroesophageal reflux disease   . Bipolar 1 disorder   . Tobacco abuse     Nicotine addiction  . Urticaria     Past Surgical History  Procedure Date  . Total abdominal hysterectomy 2002  .  Cholecystectomy 2004  . Cesarean section     Family History  Problem Relation Age of Onset  . Heart failure Mother     History  Substance Use Topics  . Smoking status: Current Everyday Smoker -- 0.5 packs/day for 30 years    Types: Cigarettes  . Smokeless tobacco: Never Used  . Alcohol Use: No    OB History    Grav Para Term Preterm Abortions TAB SAB Ect Mult Living                  Review of Systems  Gastrointestinal: Positive for abdominal pain, diarrhea and hematochezia.    10 Systems reviewed and all are negative for acute change except as noted in the HPI.   Allergies  Iohexol; Gabapentin; Methadone; and Vilazodone hcl  Home Medications   Current Outpatient Rx  Name Route Sig Dispense Refill  . ALPRAZOLAM 2 MG PO TABS Oral Take 2 mg by mouth 3 (three) times daily. Take one tab three times a day    . AMLODIPINE BESYLATE 10 MG PO TABS Oral Take 1 tablet (10 mg total) by mouth daily. Take one tab by mouth once daily 30 tablet 3  . HYDROXYZINE HCL 25 MG PO TABS Oral Take 25 mg by mouth at bedtime.    Marland Kitchen PRAVASTATIN SODIUM 40 MG PO TABS Oral Take 1 tablet (40 mg total) by mouth every evening. 30 tablet 11  .  RANITIDINE HCL 150 MG PO TABS Oral Take 150 mg by mouth 2 (two) times daily.      BP 139/88  Pulse 97  Temp(Src) 98.3 F (36.8 C) (Oral)  Resp 20  Ht 5\' 8"  (1.727 m)  Wt 200 lb (90.719 kg)  BMI 30.41 kg/m2  SpO2 97%  Physical Exam  Nursing note and vitals reviewed. Constitutional: She is oriented to person, place, and time. She appears well-developed and well-nourished. No distress.  HENT:  Head: Normocephalic and atraumatic.  Eyes: EOM are normal. Pupils are equal, round, and reactive to light.  Neck: Neck supple. No tracheal deviation present.  Cardiovascular: Normal rate and regular rhythm.   Pulmonary/Chest: Effort normal and breath sounds normal. No respiratory distress.  Abdominal: Soft. She exhibits no distension. There is tenderness.        Suprapubic tenderness, RLQ tenderness  Genitourinary:         Yellow brown stool, heme neg, no masses  Musculoskeletal: Normal range of motion. She exhibits no edema.  Neurological: She is alert and oriented to person, place, and time. No sensory deficit.  Skin: Skin is warm and dry.  Psychiatric: She has a normal mood and affect. Her behavior is normal.    ED Course  Procedures (including critical care time) DIAGNOSTIC STUDIES: Oxygen Saturation is 97% on room air, normal by my interpretation.    COORDINATION OF CARE:  11:17 AM: Course of treatment: Pt to receive pain meds, lab work, ct of abd/pelvis  Labs Reviewed  COMPREHENSIVE METABOLIC PANEL - Abnormal; Notable for the following:    Potassium 3.4 (*)    All other components within normal limits  URINALYSIS, ROUTINE W REFLEX MICROSCOPIC - Abnormal; Notable for the following:    Hgb urine dipstick TRACE (*)    Leukocytes, UA LARGE (*)    All other components within normal limits  URINE MICROSCOPIC-ADD ON - Abnormal; Notable for the following:    Squamous Epithelial / LPF FEW (*)    Bacteria, UA MANY (*)    All other components within normal limits  CBC  DIFFERENTIAL  LIPASE, BLOOD  OCCULT BLOOD, POC DEVICE  URINE CULTURE   Ct Abdomen Pelvis Wo Contrast  04/23/2012  *RADIOLOGY REPORT*  Clinical Data: Suprapubic and the right lower quadrant abdominal pain.  Rectal bleeding.  CT ABDOMEN AND PELVIS WITHOUT CONTRAST  Technique:  Multidetector CT imaging of the abdomen and pelvis was performed following the standard protocol without intravenous contrast.  Comparison: Report of CT scan dated 08/24/2003  Findings: The liver, spleen, pancreas, adrenal glands, and kidneys are normal.  Gallbladder has been removed.  The bowel is normal including the terminal ileum and appendix.  No diverticular disease.  There is no hydronephrosis.  No renal or ureteral calculi.  The bladder appears normal.  No acute osseous abnormality.  Severe  bilateral facet arthritis and 04/05 with degenerative disc and joint disease at L5-S1.  Uterus and ovaries appeared to have been removed.  IMPRESSION: No acute abnormality of the abdomen or pelvis.  Degenerative changes in the lower lumbar spine.  Original Report Authenticated By: Gwynn Burly, M.D.     No diagnosis found.    MDM  Rectal exam showed no blood. CT negative for acute pathology. Hemoglobin stable. Will treat for urinary tract infection. Also meds for pain, nausea, diarrhea.    I personally performed the services described in this documentation, which was scribed in my presence. The recorded information has been reviewed and considered.  Donnetta Hutching, MD 04/23/12 [phone]

## 2012-04-23 NOTE — Telephone Encounter (Signed)
Noted and agree. 

## 2012-04-23 NOTE — ED Notes (Signed)
Rectal bleeding x 3 wks with intermittent diarrhea, worsening over last 3 days.  Seen PCP and dx with hemorrhoids.  Denies blood in stool.  Also c/o headache.

## 2012-04-23 NOTE — Discharge Instructions (Signed)
Your blood count was normal. Rectal exam today showed no blood. You have a urinary tract infection. Medication for pain, nausea, diarrhea, antibiotic for urinary infection.  followup your primary care Dr. Gary Fleet need a colonoscopy at some point to assess the cause of your rectal bleeding

## 2012-04-25 LAB — URINE CULTURE: Culture  Setup Time: 201305250205

## 2012-04-27 ENCOUNTER — Ambulatory Visit (INDEPENDENT_AMBULATORY_CARE_PROVIDER_SITE_OTHER): Payer: Medicare Other | Admitting: Family Medicine

## 2012-04-27 ENCOUNTER — Telehealth (INDEPENDENT_AMBULATORY_CARE_PROVIDER_SITE_OTHER): Payer: Self-pay | Admitting: *Deleted

## 2012-04-27 ENCOUNTER — Encounter: Payer: Self-pay | Admitting: Family Medicine

## 2012-04-27 VITALS — BP 130/90 | HR 98 | Resp 18 | Ht 68.0 in | Wt 208.0 lb

## 2012-04-27 DIAGNOSIS — F329 Major depressive disorder, single episode, unspecified: Secondary | ICD-10-CM

## 2012-04-27 DIAGNOSIS — E785 Hyperlipidemia, unspecified: Secondary | ICD-10-CM

## 2012-04-27 DIAGNOSIS — F17200 Nicotine dependence, unspecified, uncomplicated: Secondary | ICD-10-CM

## 2012-04-27 DIAGNOSIS — K625 Hemorrhage of anus and rectum: Secondary | ICD-10-CM

## 2012-04-27 DIAGNOSIS — B373 Candidiasis of vulva and vagina: Secondary | ICD-10-CM

## 2012-04-27 DIAGNOSIS — Z720 Tobacco use: Secondary | ICD-10-CM

## 2012-04-27 DIAGNOSIS — IMO0002 Reserved for concepts with insufficient information to code with codable children: Secondary | ICD-10-CM

## 2012-04-27 DIAGNOSIS — I10 Essential (primary) hypertension: Secondary | ICD-10-CM

## 2012-04-27 MED ORDER — OXYCODONE-ACETAMINOPHEN 10-650 MG PO TABS: 1.0000 | ORAL_TABLET | Freq: Four times a day (QID) | ORAL | Status: DC | PRN

## 2012-04-27 MED ORDER — FLUCONAZOLE 150 MG PO TABS: ORAL_TABLET | ORAL | Status: DC

## 2012-04-27 MED ORDER — AMLODIPINE BESYLATE 10 MG PO TABS: 10.0000 mg | ORAL_TABLET | Freq: Every day | ORAL | Status: DC

## 2012-04-27 NOTE — Patient Instructions (Addendum)
F/u in 2 month  Medication is being sent in for vaginal itching.  I will also send a message to Dr Karilyn Cota about your recent  Rectal bleeding for past 3 weeks you need a colonoscopy  Please try to get therapy on a more regular basis\  Please try to stop smoking  You will have a repeat urine drug screen , randomly since you had an abnormal one

## 2012-04-27 NOTE — Progress Notes (Signed)
  Subjective:    Patient ID: Jane Canary Barrington, female    DOB: March 19, 1961, 51 y.o.   MRN: [id number]  HPI The PT is here for follow up and re-evaluation of chronic medical conditions, medication management and review of any available recent lab and radiology data. UDS positive for amphetamines, none are generally prescribed, she denies any known source Preventive health is updated, specifically  Cancer screening and Immunization.   Questions or concerns regarding consultations or procedures which the PT has had in the interim are  Addressed.Recently seen in the Ed and treated for UTI now c/o vaginal itch.  The PT denies any adverse reactions to current medications since the last visit.  3 week h/o intermittent BRRB , painless , first episode, colonoscopy still not yet done    Review of Systems See HPI Denies recent fever or chills. Denies sinus pressure, nasal congestion, ear pain or sore throat. Denies chest congestion, productive cough or wheezing. Denies chest pains, palpitations and leg swelling Denies abdominal pain, nausea, vomiting,diarrhea or constipation.   Denies dysuria, frequency, hesitancy or incontinence. Chronic joint pain, swelling and limitation in mobility. Denies headaches, seizures, numbness, or tingling. Chronic c/o depression, anxiety and  insomnia. Denies skin break down or rash.        Objective:   Physical Exam Patient alert and oriented and in no cardiopulmonary distress.  HEENT: No facial asymmetry, EOMI, no sinus tenderness,  oropharynx pink and moist.  Neck decreased ROM,no adenopathy.  Chest: Clear to auscultation bilaterally.Decreased air entry throughout  CVS: S1, S2 no murmurs, no S3.  ABD: Soft non tender. Bowel sounds normal.  Ext: No edema  MS: decreased ROM spine, shoulders, hips and knees.  Skin: Intact, no ulcerations or rash noted.  Psych: Good eye contact, blunted  affect. Memory impaired, both  anxious and  depressed  appearing.  CNS: CN 2-12 intact, power, tone and sensation normal throughout.        Assessment & Plan:

## 2012-04-27 NOTE — Telephone Encounter (Signed)
Patient Susan Walker asking for someone to give her a return call. Latroya would like to get her apt scheduled. The return phone number is [phone].

## 2012-04-28 NOTE — Telephone Encounter (Signed)
Spoke to patient, she is having rectal bleeding & abd pain, so I sch'd her an OV to see Terri 05/04/12, patient aware

## 2012-05-02 DIAGNOSIS — K625 Hemorrhage of anus and rectum: Secondary | ICD-10-CM | POA: Insufficient documentation

## 2012-05-02 NOTE — Assessment & Plan Note (Signed)
Chronic and unchanged, recent UDS positive for amphetamines, none prescribed to my nowledge , will check again with psych,i f posistive again then she will need to go to pain , or change to vicodin

## 2012-05-02 NOTE — Assessment & Plan Note (Signed)
Subjective complaint of itch, fluconazole prescribed, needs HSV testing

## 2012-05-02 NOTE — Assessment & Plan Note (Signed)
New problem, colon cancer screening not yet done, pt has been putting this off but understands and agrees with the need to have this asap

## 2012-05-02 NOTE — Assessment & Plan Note (Signed)
Hyperlipidemia:Low fat diet discussed and encouraged.  Medication compliance needs improvement also

## 2012-05-02 NOTE — Assessment & Plan Note (Signed)
Uncontrolled, no med change, lifestyle change only at this time

## 2012-05-02 NOTE — Assessment & Plan Note (Signed)
Chronically uncontrolled, needs psychotherapy, states this is being addressed by her provider, but has insufficient sessions

## 2012-05-02 NOTE — Assessment & Plan Note (Signed)
Unchanged , cessation counseling done 

## 2012-05-03 ENCOUNTER — Ambulatory Visit: Payer: Medicare Other | Admitting: Family Medicine

## 2012-05-04 ENCOUNTER — Ambulatory Visit (INDEPENDENT_AMBULATORY_CARE_PROVIDER_SITE_OTHER): Payer: Medicare Other | Admitting: Internal Medicine

## 2012-05-04 ENCOUNTER — Other Ambulatory Visit (INDEPENDENT_AMBULATORY_CARE_PROVIDER_SITE_OTHER): Payer: Self-pay | Admitting: *Deleted

## 2012-05-04 ENCOUNTER — Telehealth (INDEPENDENT_AMBULATORY_CARE_PROVIDER_SITE_OTHER): Payer: Self-pay | Admitting: *Deleted

## 2012-05-04 ENCOUNTER — Encounter (INDEPENDENT_AMBULATORY_CARE_PROVIDER_SITE_OTHER): Payer: Self-pay | Admitting: Internal Medicine

## 2012-05-04 VITALS — BP 104/62 | HR 84 | Temp 98.7°F | Ht 68.0 in | Wt 211.3 lb

## 2012-05-04 DIAGNOSIS — K625 Hemorrhage of anus and rectum: Secondary | ICD-10-CM

## 2012-05-04 MED ORDER — PEG-KCL-NACL-NASULF-NA ASC-C 100 G PO SOLR: 1.0000 | Freq: Once | ORAL | Status: DC

## 2012-05-04 NOTE — Progress Notes (Signed)
Subjective:     Patient ID: Susan Walker, female   DOB: 06-07-1961, 51 y.o.   MRN: [id number]  HPI Taffie is a 51 yr old female referred to our office by Dr. Lodema Hong for rectal bleeding. She was seen in the ED a week ago for rectal bleeding. She was guaiac negative in the ED. She says she had diarrhea x 3 weeks. She was having 7 stools a day and very watery.  She saw blood.  The rectal bleeding resolved 2 days after presenting to the ED. H and H was 14.0 and 39.4.  CT scan abdomen and pelvis was normal.  She has not seen any blood since. She did have lower abdominal pain with the rectal bleeding. She tells me her rectum was sore.   Appetite is good. No weight loss.  Occasionally has rectal pain. She usually has a BM daily x 4.  BM after each meal. Stool are solid and some are watery. No nausea or vomiting. No melena.  She was treated in the ED for a UTI and put on Macrobid.  Review of Systems see hpi Current Outpatient Prescriptions  Medication Sig Dispense Refill  . alprazolam (XANAX) 2 MG tablet Take 2 mg by mouth 4 (four) times daily. Take one tab three times a day      . amLODipine (NORVASC) 10 MG tablet Take 1 tablet (10 mg total) by mouth daily. Take one tab by mouth once daily  30 tablet  3  . hydrOXYzine (ATARAX/VISTARIL) 25 MG tablet Take 25 mg by mouth at bedtime.      Marland Kitchen oxyCODONE-acetaminophen (PERCOCET) 10-650 MG per tablet Take 1 tablet by mouth every 6 (six) hours as needed. pain  120 tablet  0  . pravastatin (PRAVACHOL) 40 MG tablet Take 1 tablet (40 mg total) by mouth every evening.  30 tablet  11  . ranitidine (ZANTAC) 150 MG tablet Take 150 mg by mouth 2 (two) times daily.      . diphenoxylate-atropine (LOMOTIL) 2.5-0.025 MG per tablet Take 1 tablet by mouth 4 (four) times daily as needed for diarrhea or loose stools.  30 tablet  0  . nitrofurantoin, macrocrystal-monohydrate, (MACROBID) 100 MG capsule Take 1 capsule (100 mg total) by mouth 2 (two) times daily. X 7 days  14  capsule  0  . oxyCODONE-acetaminophen (PERCOCET) 5-325 MG per tablet Take 1-2 tablets by mouth every 6 (six) hours as needed for pain.  25 tablet  0   Past Medical History  Diagnosis Date  . Chest pain, unspecified 12/2006    Normal Echo and myovue   . Dyspnea   . Nicotine addiction   . Back pain     With radiculopathy  . Depression   . Hyperlipidemia   . Hypertension   . Panic attack   . Palpitations   . Cerebrovascular disease     carotid bruit  . Obesity   . Gastroesophageal reflux disease   . Bipolar 1 disorder   . Tobacco abuse     Nicotine addiction  . Urticaria    Past Surgical History  Procedure Date  . Total abdominal hysterectomy 2002  . Cholecystectomy 2004  . Cesarean section    Family Status  Relation Status Death Age  . Mother Deceased 42    enlarged heart  . Father Alive    History   Social History  . Marital Status: Single    Spouse Name: N/A    Number of Children: 1  .  Years of Education: N/A   Occupational History  . disable     Social History Main Topics  . Smoking status: Current Everyday Smoker -- 0.5 packs/day for 30 years    Types: Cigarettes  . Smokeless tobacco: Never Used   Comment: 6 cigarettes a day since age 22  . Alcohol Use: No  . Drug Use: No  . Sexually Active: Not Currently   Other Topics Concern  . Not on file   Social History Narrative  . No narrative on file   Allergies  Allergen Reactions  . Iohexol Anaphylaxis       . Gabapentin Hives, Nausea And Vomiting and Swelling  . Methadone Swelling  . Vilazodone Hcl         Objective:   Physical Exam Filed Vitals:   05/04/12 0951  Height: 5\' 8"  (1.727 m)  Weight: 211 lb 4.8 oz (95.845 kg)    Alert and oriented. Skin warm and dry. Oral mucosa is moist.   . Sclera anicteric, conjunctivae is pink. Thyroid not enlarged. No cervical lymphadenopathy. Lungs clear. Heart regular rate and rhythm.  Abdomen is soft. Bowel sounds are positive. No hepatomegaly. No  abdominal masses felt. No tenderness.  No edema to lower extremities. Patient is alert and oriented.   CBC    Component Value Date/Time   WBC 7.5 04/23/2012 1142   RBC 4.45 04/23/2012 1142   HGB 14.0 04/23/2012 1142   HCT 39.4 04/23/2012 1142   PLT 202 04/23/2012 1142   MCV 88.5 04/23/2012 1142   MCH 31.5 04/23/2012 1142   MCHC 35.5 04/23/2012 1142   RDW 13.0 04/23/2012 1142   LYMPHSABS 2.8 04/23/2012 1142   MONOABS 0.5 04/23/2012 1142   EOSABS 0.1 04/23/2012 1142   BASOSABS 0.0 04/23/2012 1142   CMP     Component Value Date/Time   NA 143 04/23/2012 1142   K 3.4* 04/23/2012 1142   CL 107 04/23/2012 1142   CO2 25 04/23/2012 1142   GLUCOSE 89 04/23/2012 1142   BUN 12 04/23/2012 1142   CREATININE 0.69 04/23/2012 1142   CREATININE 0.70 11/13/2011 0931   CALCIUM 9.9 04/23/2012 1142   PROT 7.9 04/23/2012 1142   ALBUMIN 4.3 04/23/2012 1142   AST 14 04/23/2012 1142   ALT 15 04/23/2012 1142   ALKPHOS 117 04/23/2012 1142   BILITOT 0.3 04/23/2012 1142   GFRNONAA >90 04/23/2012 1142   GFRAA >90 04/23/2012 1142    Urinalysis    Component Value Date/Time   COLORURINE YELLOW 04/23/2012 1240   APPEARANCEUR CLEAR 04/23/2012 1240   LABSPEC 1.015 04/23/2012 1240   PHURINE 6.0 04/23/2012 1240   GLUCOSEU NEGATIVE 04/23/2012 1240   HGBUR TRACE* 04/23/2012 1240   BILIRUBINUR NEGATIVE 04/23/2012 1240   KETONESUR NEGATIVE 04/23/2012 1240   PROTEINUR NEGATIVE 04/23/2012 1240   UROBILINOGEN 0.2 04/23/2012 1240   NITRITE NEGATIVE 04/23/2012 1240   LEUKOCYTESUR LARGE* 04/23/2012 1240       CT abdomen/pelvis w/o CM: 04/24/2012:IMPRESSION:  No acute abnormality of the abdomen or pelvis. Degenerative  changes in the lower lumbar spine.      Assessment:   Rectal bleeding. Diarrhea. Change in stools. Suspect she may have had a hemorrhoidal bleed. Colonic neoplasm needs to be ruled. She has never undergone a colonoscopy.    Plan:    Colonoscopy..  The risks and benefits such as perforation, bleeding, and infection were  reviewed with the patient and is agreeable.

## 2012-05-04 NOTE — Patient Instructions (Signed)
Colonoscopy with Dr. Rehman. The risks and benefits such as perforation, bleeding, and infection were reviewed with the patient and is agreeable. 

## 2012-05-04 NOTE — Telephone Encounter (Signed)
Patient needs movi prep 

## 2012-05-10 ENCOUNTER — Telehealth: Payer: Self-pay | Admitting: Family Medicine

## 2012-05-10 NOTE — Telephone Encounter (Signed)
Itching bad in her vaginal area. She took her antibiotics and the 2 diflucans and it seemed to get better for a few days but now its itching so bad and irritated and she wants more pills called in. I tried to get her to make appt but she kept stating she just needed those yeast pills. Will you refill or does she need appt?

## 2012-05-10 NOTE — Telephone Encounter (Signed)
Refill x 1 please. Let her know if persists needs appt

## 2012-05-11 ENCOUNTER — Other Ambulatory Visit: Payer: Self-pay

## 2012-05-11 DIAGNOSIS — B373 Candidiasis of vulva and vagina: Secondary | ICD-10-CM

## 2012-05-11 MED ORDER — FLUCONAZOLE 150 MG PO TABS: ORAL_TABLET | ORAL | Status: DC

## 2012-05-11 NOTE — Telephone Encounter (Signed)
Pt aware.

## 2012-05-12 ENCOUNTER — Encounter (HOSPITAL_COMMUNITY): Payer: Self-pay | Admitting: Pharmacy Technician

## 2012-05-19 MED ORDER — SODIUM CHLORIDE 0.45 % IV SOLN
Freq: Once | INTRAVENOUS | Status: AC
  Administered 2012-05-20: 1000 mL via INTRAVENOUS

## 2012-05-20 ENCOUNTER — Encounter (HOSPITAL_COMMUNITY): Payer: Self-pay | Admitting: *Deleted

## 2012-05-20 ENCOUNTER — Ambulatory Visit (HOSPITAL_COMMUNITY)
Admission: RE | Admit: 2012-05-20 | Discharge: 2012-05-20 | Disposition: A | Discharge status: Home Health | Payer: Medicare Other | Source: Ambulatory Visit | Attending: Internal Medicine | Admitting: Internal Medicine

## 2012-05-20 ENCOUNTER — Encounter (HOSPITAL_COMMUNITY)
Admission: RE | Disposition: A | Discharge status: Home Health | Payer: Self-pay | Source: Ambulatory Visit | Attending: Internal Medicine

## 2012-05-20 DIAGNOSIS — K625 Hemorrhage of anus and rectum: Secondary | ICD-10-CM | POA: Diagnosis not present

## 2012-05-20 DIAGNOSIS — I10 Essential (primary) hypertension: Secondary | ICD-10-CM | POA: Insufficient documentation

## 2012-05-20 DIAGNOSIS — K644 Residual hemorrhoidal skin tags: Secondary | ICD-10-CM | POA: Diagnosis not present

## 2012-05-20 DIAGNOSIS — R197 Diarrhea, unspecified: Secondary | ICD-10-CM | POA: Diagnosis not present

## 2012-05-20 DIAGNOSIS — D128 Benign neoplasm of rectum: Secondary | ICD-10-CM | POA: Insufficient documentation

## 2012-05-20 DIAGNOSIS — E785 Hyperlipidemia, unspecified: Secondary | ICD-10-CM | POA: Diagnosis not present

## 2012-05-20 DIAGNOSIS — D129 Benign neoplasm of anus and anal canal: Secondary | ICD-10-CM

## 2012-05-20 DIAGNOSIS — Z79899 Other long term (current) drug therapy: Secondary | ICD-10-CM | POA: Diagnosis not present

## 2012-05-20 HISTORY — PX: COLONOSCOPY: SHX5424

## 2012-05-20 SURGERY — COLONOSCOPY
Anesthesia: Moderate Sedation

## 2012-05-20 MED ORDER — PROMETHAZINE HCL 25 MG/ML IJ SOLN
INTRAMUSCULAR | Status: AC
  Filled 2012-05-20: qty 1

## 2012-05-20 MED ORDER — STERILE WATER FOR IRRIGATION IR SOLN
Status: DC | PRN
  Administered 2012-05-20: 13:00:00

## 2012-05-20 MED ORDER — MEPERIDINE HCL 50 MG/ML IJ SOLN
INTRAMUSCULAR | Status: AC
  Filled 2012-05-20: qty 1

## 2012-05-20 MED ORDER — MEPERIDINE HCL 50 MG/ML IJ SOLN
INTRAMUSCULAR | Status: AC
  Filled 2012-05-20: qty 2

## 2012-05-20 MED ORDER — MIDAZOLAM HCL 5 MG/5ML IJ SOLN
INTRAMUSCULAR | Status: DC | PRN
  Administered 2012-05-20 (×5): 4 mg via INTRAVENOUS

## 2012-05-20 MED ORDER — PROMETHAZINE HCL 25 MG/ML IJ SOLN
INTRAMUSCULAR | Status: DC | PRN
  Administered 2012-05-20 (×2): 12.5 mg via INTRAVENOUS

## 2012-05-20 MED ORDER — DICYCLOMINE HCL 10 MG PO CAPS: 10.0000 mg | ORAL_CAPSULE | Freq: Three times a day (TID) | ORAL | Status: DC

## 2012-05-20 MED ORDER — MIDAZOLAM HCL 5 MG/5ML IJ SOLN
INTRAMUSCULAR | Status: AC
  Filled 2012-05-20: qty 20

## 2012-05-20 MED ORDER — MEPERIDINE HCL 50 MG/ML IJ SOLN
INTRAMUSCULAR | Status: DC | PRN
  Administered 2012-05-20 (×4): 25 mg via INTRAVENOUS

## 2012-05-20 NOTE — Discharge Instructions (Signed)
Resume usual medications. High fiber diet. Fiber supplement 3-4 g daily. Dicyclomine 10 mg by mouth 30 minutes before each meal. No driving for 24 hours. Physician will contact you with biopsy results  Colonoscopy Care After Read the instructions outlined below and refer to this sheet in the next few weeks. These discharge instructions provide you with general information on caring for yourself after you leave the hospital. Your doctor may also give you specific instructions. While your treatment has been planned according to the most current medical practices available, unavoidable complications occasionally occur. If you have any problems or questions after discharge, call your doctor. HOME CARE INSTRUCTIONS ACTIVITY:  You may resume your regular activity, but move at a slower pace for the next 24 hours.   Take frequent rest periods for the next 24 hours.   Walking will help get rid of the air and reduce the bloated feeling in your belly (abdomen).   No driving for 24 hours (because of the medicine (anesthesia) used during the test).   You may shower.   Do not sign any important legal documents or operate any machinery for 24 hours (because of the anesthesia used during the test).  NUTRITION:  Drink plenty of fluids.   You may resume your normal diet as instructed by your doctor.   Begin with a light meal and progress to your normal diet. Heavy or fried foods are harder to digest and may make you feel sick to your stomach (nauseated).   Avoid alcoholic beverages for 24 hours or as instructed.  MEDICATIONS:  You may resume your normal medications unless your doctor tells you otherwise.  WHAT TO EXPECT TODAY:  Some feelings of bloating in the abdomen.   Passage of more gas than usual.   Spotting of blood in your stool or on the toilet paper.  IF YOU HAD POLYPS REMOVED DURING THE COLONOSCOPY:  No aspirin products for 7 days or as instructed.   No alcohol for 7 days or as  instructed.   Eat a soft diet for the next 24 hours.  FINDING OUT THE RESULTS OF YOUR TEST Not all test results are available during your visit. If your test results are not back during the visit, make an appointment with your caregiver to find out the results. Do not assume everything is normal if you have not heard from your caregiver or the medical facility. It is important for you to follow up on all of your test results.  SEEK IMMEDIATE MEDICAL CARE IF:  You have more than a spotting of blood in your stool.   Your belly is swollen (abdominal distention).   You are nauseated or vomiting.   You have a fever.   You have abdominal pain or discomfort that is severe or gets worse throughout the day.  Document Released: 07/01/2004 Document Revised: 11/06/2011 Document Reviewed: 06/29/2008 [facility] Patient Information 2012 [place], [place].  Colon Polyps A polyp is extra tissue that grows inside your body. Colon polyps grow in the large intestine. The large intestine, also called the colon, is part of your digestive system. It is a long, hollow tube at the end of your digestive tract where your body makes and stores stool. Most polyps are not dangerous. They are benign. This means they are not cancerous. But over time, some types of polyps can turn into cancer. Polyps that are smaller than a pea are usually not harmful. But larger polyps could someday become or may already be cancerous. To be  safe, doctors remove all polyps and test them.  WHO GETS POLYPS? Anyone can get polyps, but certain people are more likely than others. You may have a greater chance of getting polyps if:  You are over 50.   You have had polyps before.   Someone in your family has had polyps.   Someone in your family has had cancer of the large intestine.   Find out if someone in your family has had polyps. You may also be more likely to get polyps if you:   Eat a lot of fatty foods.   Smoke.   Drink alcohol.     Do not exercise.   Eat too much.  SYMPTOMS  Most small polyps do not cause symptoms. People often do not know they have one until their caregiver finds it during a regular checkup or while testing them for something else. Some people do have symptoms like these:  Bleeding from the anus. You might notice blood on your underwear or on toilet paper after you have had a bowel movement.   Constipation or diarrhea that lasts more than a week.   Blood in the stool. Blood can make stool look black or it can show up as red streaks in the stool.  If you have any of these symptoms, see your caregiver. HOW DOES THE DOCTOR TEST FOR POLYPS? The doctor can use four tests to check for polyps:  Digital rectal exam. The caregiver wears gloves and checks your rectum (the last part of the large intestine) to see if it feels normal. This test would find polyps only in the rectum. Your caregiver may need to do one of the other tests listed below to find polyps higher up in the intestine.   Barium enema. The caregiver puts a liquid called barium into your rectum before taking x-rays of your large intestine. Barium makes your intestine look white in the pictures. Polyps are dark, so they are easy to see.   Sigmoidoscopy. With this test, the caregiver can see inside your large intestine. A thin flexible tube is placed into your rectum. The device is called a sigmoidoscope, which has a light and a tiny video camera in it. The caregiver uses the sigmoidoscope to look at the last third of your large intestine.   Colonoscopy. This test is like sigmoidoscopy, but the caregiver looks at all of the large intestine. It usually requires sedation. This is the most common method for finding and removing polyps.  TREATMENT   The caregiver will remove the polyp during sigmoidoscopy or colonoscopy. The polyp is then tested for cancer.   If you have had polyps, your caregiver may want you to get tested regularly in the  future.  PREVENTION  There is not one sure way to prevent polyps. You might be able to lower your risk of getting them if you:  Eat more fruits and vegetables and less fatty food.   Do not smoke.   Avoid alcohol.   Exercise every day.   Lose weight if you are overweight.   Eating more calcium and folate can also lower your risk of getting polyps. Some foods that are rich in calcium are milk, cheese, and broccoli. Some foods that are rich in folate are chickpeas, kidney beans, and spinach.   Aspirin might help prevent polyps. Studies are under way.  Document Released: 08/13/2004 Document Revised: 11/06/2011 Document Reviewed: 01/19/2008 [facility] Patient Information 2012 [place], [place].  Hemorrhoids Hemorrhoids are enlarged (dilated) veins around the  rectum. There are 2 types of hemorrhoids, and the type of hemorrhoid is determined by its location. Internal hemorrhoids occur in the veins just inside the rectum.They are usually not painful, but they may bleed.However, they may poke through to the outside and become irritated and painful. External hemorrhoids involve the veins outside the anus and can be felt as a painful swelling or hard lump near the anus.They are often itchy and may crack and bleed. Sometimes clots will form in the veins. This makes them swollen and painful. These are called thrombosed hemorrhoids. CAUSES Causes of hemorrhoids include:  Pregnancy. This increases the pressure in the hemorrhoidal veins.   Constipation.   Straining to have a bowel movement.   Obesity.   Heavy lifting or other activity that caused you to strain.  TREATMENT Most of the time hemorrhoids improve in 1 to 2 weeks. However, if symptoms do not seem to be getting better or if you have a lot of rectal bleeding, your caregiver may perform a procedure to help make the hemorrhoids get smaller or remove them completely.Possible treatments include:  Rubber band ligation. A rubber band is  placed at the base of the hemorrhoid to cut off the circulation.   Sclerotherapy. A chemical is injected to shrink the hemorrhoid.   Infrared light therapy. Tools are used to burn the hemorrhoid.   Hemorrhoidectomy. This is surgical removal of the hemorrhoid.  HOME CARE INSTRUCTIONS   Increase fiber in your diet. Ask your caregiver about using fiber supplements.   Drink enough water and fluids to keep your urine clear or pale yellow.   Exercise regularly.   Go to the bathroom when you have the urge to have a bowel movement. Do not wait.   Avoid straining to have bowel movements.   Keep the anal area dry and clean.   Only take over-the-counter or prescription medicines for pain, discomfort, or fever as directed by your caregiver.  If your hemorrhoids are thrombosed:  Take warm sitz baths for 20 to 30 minutes, 3 to 4 times per day.   If the hemorrhoids are very tender and swollen, place ice packs on the area as tolerated. Using ice packs between sitz baths may be helpful. Fill a plastic bag with ice. Place a towel between the bag of ice and your skin.   Medicated creams and suppositories may be used or applied as directed.   Do not use a donut-shaped pillow or sit on the toilet for long periods. This increases blood pooling and pain.  SEEK MEDICAL CARE IF:   You have increasing pain and swelling that is not controlled with your medicine.   You have uncontrolled bleeding.   You have difficulty or you are unable to have a bowel movement.   You have pain or inflammation outside the area of the hemorrhoids.   You have chills or an oral temperature above 102 F (38.9 C).  MAKE SURE YOU:   Understand these instructions.   Will watch your condition.   Will get help right away if you are not doing well or get worse.  Document Released: 11/14/2000 Document Revised: 11/06/2011 Document Reviewed: 03/21/2008 [facility] Patient Information 2012 [place], [place].

## 2012-05-20 NOTE — Op Note (Signed)
COLONOSCOPY PROCEDURE REPORT  PATIENT:  Susan Walker  MR#:  [id number] Birthdate:  [birth year], 51 y.o., female Endoscopist:  Dr. Malissa Hippo, MD Referred By:  Dr. Syliva Overman, MD   Procedure Date: 05/20/2012  Procedure:   Colonoscopy  Indications:  Patient is 51 year old African female with two-month history of intermittent diarrhea and rectal bleeding. She is undergoing diagnostic colonoscopy.  Informed Consent:  The procedure and risks were reviewed with the patient and informed consent was obtained.  Medications:  Demerol 100 mg IV Versed 20 mg IV Promethazine 25 mg IV in diluted form.  Description of procedure:  After a digital rectal exam was performed, that colonoscope was advanced from the anus through the rectum and colon to the area of the cecum, ileocecal valve and appendiceal orifice. The cecum was deeply intubated. These structures were well-seen and photographed for the record. From the level of the cecum and ileocecal valve, the scope was slowly and cautiously withdrawn. The mucosal surfaces were carefully surveyed utilizing scope tip to flexion to facilitate fold flattening as needed. The scope was pulled down into the rectum where a thorough exam including retroflexion was performed.  Findings:   Prep excellent. Normal mucosa throughout. Small rectal polyp ablated via cold biopsy. Prominent hemorrhoids below the dentate line.   Therapeutic/Diagnostic Maneuvers Performed:  None  Complications:  None  Cecal Withdrawal Time:  9 minutes  Impression:  Examination performed to cecum. No evidence of endoscopic colitis. Small rectal polyp ablated via cold biopsy. External hemorrhoids. Suspect rectal bleeding secondary to hemorrhoids and diarrhea secondary to IBS. She may also have narcotic bowel syndrome.  Recommendations:  Standard instructions given. Dicyclomine 10 mg by mouth 3 times a day. I will contact patient with biopsy  results.  Scarleth Brame U  05/20/2012 1:52 PM  CC: Dr. Syliva Overman, MD & Dr. Bonnetta Barry ref. provider found
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
## 2012-05-21 ENCOUNTER — Other Ambulatory Visit: Payer: Self-pay

## 2012-05-21 MED ORDER — OXYCODONE-ACETAMINOPHEN 10-650 MG PO TABS: 1.0000 | ORAL_TABLET | Freq: Four times a day (QID) | ORAL | Status: DC | PRN

## 2012-05-24 ENCOUNTER — Encounter (HOSPITAL_COMMUNITY): Payer: Self-pay | Admitting: Internal Medicine

## 2012-05-26 ENCOUNTER — Telehealth: Payer: Self-pay | Admitting: Family Medicine

## 2012-05-26 ENCOUNTER — Encounter (INDEPENDENT_AMBULATORY_CARE_PROVIDER_SITE_OTHER): Payer: Self-pay | Admitting: *Deleted

## 2012-05-26 NOTE — Telephone Encounter (Signed)
Patient aware it is ready  

## 2012-06-21 ENCOUNTER — Other Ambulatory Visit: Payer: Self-pay

## 2012-06-21 MED ORDER — OXYCODONE-ACETAMINOPHEN 10-650 MG PO TABS: 1.0000 | ORAL_TABLET | Freq: Four times a day (QID) | ORAL | Status: DC | PRN

## 2012-06-25 ENCOUNTER — Ambulatory Visit (INDEPENDENT_AMBULATORY_CARE_PROVIDER_SITE_OTHER): Payer: Medicare Other | Admitting: Family Medicine

## 2012-06-25 ENCOUNTER — Encounter: Payer: Self-pay | Admitting: Family Medicine

## 2012-06-25 VITALS — BP 136/80 | HR 95 | Resp 18 | Ht 68.0 in | Wt 204.0 lb

## 2012-06-25 DIAGNOSIS — F17200 Nicotine dependence, unspecified, uncomplicated: Secondary | ICD-10-CM | POA: Diagnosis not present

## 2012-06-25 DIAGNOSIS — Z720 Tobacco use: Secondary | ICD-10-CM

## 2012-06-25 DIAGNOSIS — F329 Major depressive disorder, single episode, unspecified: Secondary | ICD-10-CM

## 2012-06-25 DIAGNOSIS — I10 Essential (primary) hypertension: Secondary | ICD-10-CM

## 2012-06-25 DIAGNOSIS — E785 Hyperlipidemia, unspecified: Secondary | ICD-10-CM | POA: Diagnosis not present

## 2012-06-25 DIAGNOSIS — IMO0002 Reserved for concepts with insufficient information to code with codable children: Secondary | ICD-10-CM

## 2012-06-25 MED ORDER — AMLODIPINE BESYLATE 10 MG PO TABS: 10.0000 mg | ORAL_TABLET | Freq: Every day | ORAL | Status: DC

## 2012-06-25 NOTE — Assessment & Plan Note (Signed)
Controlled, no change in medication  

## 2012-06-25 NOTE — Assessment & Plan Note (Signed)
Unchanged, pt to continue chronic medication

## 2012-06-25 NOTE — Progress Notes (Signed)
  Subjective:    Patient ID: Susan Walker, female    DOB: [birth year], 51 y.o.   MRN: [id number]  HPI The PT is here for follow up and re-evaluation of chronic medical conditions, medication management and review of any available recent lab and radiology data.  Preventive health is updated, specifically  Cancer screening and Immunization. Refusing mammogram at this time  States she recently got a mood stabilizing injection and she has felt worse since then, states muffling noises in her ears and blurry vision, has not started the citalopram prescribed and has been drinking for the last 2 days. Not suicidal or homicidal. Also c/o insomnia. C/o lower extremity pain     Review of Systems See HPI Denies recent fever or chills. Denies sinus pressure, nasal congestion, ear pain or sore throat. Denies chest congestion, productive cough or wheezing. Denies chest pains, palpitations and leg swelling Denies abdominal pain, nausea, vomiting,diarrhea or constipation.   Denies dysuria, frequency, hesitancy or incontinence. Denies joint pain, swelling and limitation in mobility. Denies headaches, seizures, numbness, or tingling.  Denies skin break down or rash.        Objective:   Physical Exam Patient alert and oriented and in no cardiopulmonary distress.  HEENT: No facial asymmetry, EOMI, no sinus tenderness,  oropharynx pink and moist.  Neck supple no adenopathy.  Chest: Clear to auscultation bilaterally.  CVS: S1, S2 no murmurs, no S3.  ABD: Soft non tender. Bowel sounds normal.  Ext: No edema  MS: decreased  ROM spine, shoulders, hips and knees.  Skin: Intact, no ulcerations or rash noted.  Psych: Poor  eye contact, flat  affect. Memory impaired  depressed appearing.  CNS: CN 2-12 intact, power,  normal throughout.        Assessment & Plan:

## 2012-06-25 NOTE — Assessment & Plan Note (Signed)
Pt counselled for approximately 5 minutes re the dangers of smoking andhence the need to commit to quitting. Increased risk of cancer, heart disease and stroke stated

## 2012-06-25 NOTE — Assessment & Plan Note (Signed)
Uncontrolled, updated lab needed Hyperlipidemia:Low fat diet discussed and encouraged.   

## 2012-06-25 NOTE — Assessment & Plan Note (Signed)
Severe treated by psych. Will have visiting nurse or social worker doing home visits

## 2012-06-25 NOTE — Patient Instructions (Addendum)
Annual wellness visit in 2 month  Please stop drinking alcohol, you need to.  Continue to cu back on cigarretes, you need to quit.  Take medication from psychiatry as prescribed   Fasting lipid and cmp 1 week before next visit

## 2012-06-28 ENCOUNTER — Ambulatory Visit: Payer: Medicare Other | Admitting: Family Medicine

## 2012-07-01 ENCOUNTER — Other Ambulatory Visit: Payer: Self-pay

## 2012-07-01 ENCOUNTER — Emergency Department (HOSPITAL_COMMUNITY)
Admission: EM | Admit: 2012-07-01 | Discharge: 2012-07-01 | Disposition: A | Discharge status: Home / Self Care | Payer: Medicare Other | Attending: Emergency Medicine | Admitting: Emergency Medicine

## 2012-07-01 ENCOUNTER — Encounter (HOSPITAL_COMMUNITY): Payer: Self-pay | Admitting: *Deleted

## 2012-07-01 DIAGNOSIS — E785 Hyperlipidemia, unspecified: Secondary | ICD-10-CM | POA: Insufficient documentation

## 2012-07-01 DIAGNOSIS — T424X4A Poisoning by benzodiazepines, undetermined, initial encounter: Secondary | ICD-10-CM | POA: Diagnosis not present

## 2012-07-01 DIAGNOSIS — F411 Generalized anxiety disorder: Secondary | ICD-10-CM | POA: Insufficient documentation

## 2012-07-01 DIAGNOSIS — F319 Bipolar disorder, unspecified: Secondary | ICD-10-CM | POA: Diagnosis not present

## 2012-07-01 DIAGNOSIS — K219 Gastro-esophageal reflux disease without esophagitis: Secondary | ICD-10-CM | POA: Insufficient documentation

## 2012-07-01 DIAGNOSIS — G8929 Other chronic pain: Secondary | ICD-10-CM | POA: Diagnosis not present

## 2012-07-01 DIAGNOSIS — Z8673 Personal history of transient ischemic attack (TIA), and cerebral infarction without residual deficits: Secondary | ICD-10-CM | POA: Diagnosis not present

## 2012-07-01 DIAGNOSIS — F17200 Nicotine dependence, unspecified, uncomplicated: Secondary | ICD-10-CM | POA: Diagnosis not present

## 2012-07-01 DIAGNOSIS — I10 Essential (primary) hypertension: Secondary | ICD-10-CM | POA: Insufficient documentation

## 2012-07-01 DIAGNOSIS — T424X1A Poisoning by benzodiazepines, accidental (unintentional), initial encounter: Secondary | ICD-10-CM | POA: Insufficient documentation

## 2012-07-01 DIAGNOSIS — T43294A Poisoning by other antidepressants, undetermined, initial encounter: Secondary | ICD-10-CM | POA: Diagnosis not present

## 2012-07-01 DIAGNOSIS — F419 Anxiety disorder, unspecified: Secondary | ICD-10-CM

## 2012-07-01 NOTE — ED Provider Notes (Signed)
History     CSN: [id number]  Arrival date & time 07/01/12  0204   None     Chief Complaint  Patient presents with  . Panic Attack  . Drug Overdose    (Consider location/radiation/quality/duration/timing/severity/associated sxs/prior treatment) HPI  Susan Walker is a 51 y.o. female who presents to the Emergency Department complaining of heart racing and taking too many medicines. She reports she has chronic back pain for which she is prescribed narcotics. Her back began to hurt last night and she took an extra pain pill. Shortly after, her heart began to race and she took a total of three xanax. She continued to have a rapid hart beat, got scared and came to the ER. She denies fever, chills, nausea, vomiting, headache, shortness of breath.Upon arrrival, HR 103. Since then it has steadily gone down without intervention. She feels she is at her baseline.  PCP Dr. Lodema Hong  Past Medical History  Diagnosis Date  . Chest pain, unspecified 12/2006    Normal Echo and myovue   . Dyspnea   . Nicotine addiction   . Back pain     With radiculopathy  . Depression   . Hyperlipidemia   . Hypertension   . Panic attack   . Palpitations   . Cerebrovascular disease     carotid bruit  . Obesity   . Gastroesophageal reflux disease   . Bipolar 1 disorder   . Tobacco abuse     Nicotine addiction  . Urticaria     Past Surgical History  Procedure Date  . Total abdominal hysterectomy 2002  . Cholecystectomy 2004  . Cesarean section   . Colonoscopy 05/20/2012    Procedure: COLONOSCOPY;  Surgeon: Malissa Hippo, MD;  Location: [facility];  Service: Endoscopy;  Laterality: N/A;  200    Family History  Problem Relation Age of Onset  . Heart failure Mother     History  Substance Use Topics  . Smoking status: Current Everyday Smoker -- 0.5 packs/day for 30 years    Types: Cigarettes  . Smokeless tobacco: Never Used   Comment: 6 cigarettes a day since age 52  . Alcohol Use: No     OB History    Grav Para Term Preterm Abortions TAB SAB Ect Mult Living                  Review of Systems  Constitutional: Negative for fever.       10 Systems reviewed and are negative for acute change except as noted in the HPI.  HENT: Negative for congestion.   Eyes: Negative for discharge and redness.  Respiratory: Negative for cough and shortness of breath.   Cardiovascular: Negative for chest pain.       Tachycardia  Gastrointestinal: Negative for vomiting and abdominal pain.  Musculoskeletal: Negative for back pain.  Skin: Negative for rash.  Neurological: Negative for syncope, numbness and headaches.  Psychiatric/Behavioral:       Anxirty    Allergies  Iohexol; Celery oil; Gabapentin; Methadone; and Vilazodone hcl  Home Medications   Current Outpatient Rx  Name Route Sig Dispense Refill  . ALPRAZOLAM 2 MG PO TABS Oral Take 2 mg by mouth 4 (four) times daily.     Marland Kitchen AMLODIPINE BESYLATE 10 MG PO TABS Oral Take 1 tablet (10 mg total) by mouth daily. 30 tablet 4  . CITALOPRAM HYDROBROMIDE 20 MG PO TABS Oral Take 1 tablet (20 mg total) by mouth daily. 30 tablet  2  . DICYCLOMINE HCL 10 MG PO CAPS Oral Take 1 capsule (10 mg total) by mouth 4 (four) times daily -  before meals and at bedtime. 90 capsule 3  . HYDROXYZINE HCL 25 MG PO TABS Oral Take 25 mg by mouth at bedtime.    . OXYCODONE-ACETAMINOPHEN 10-650 MG PO TABS Oral Take 1 tablet by mouth every 6 (six) hours as needed. pain 120 tablet 0  . PRAVASTATIN SODIUM 40 MG PO TABS Oral Take 1 tablet (40 mg total) by mouth every evening. 30 tablet 11  . RANITIDINE HCL 150 MG PO TABS Oral Take 150 mg by mouth 2 (two) times daily.      BP 132/86  Pulse 103  Temp 98.6 F (37 C) (Oral)  Resp 18  Ht 5\' 8"  (1.727 m)  Wt 204 lb (92.534 kg)  BMI 31.02 kg/m2  SpO2 96%  Physical Exam  Nursing note and vitals reviewed. Constitutional: She is oriented to person, place, and time. She appears well-developed and  well-nourished. No distress.       Awake, alert, nontoxic appearance.  HENT:  Head: Atraumatic.  Eyes: Right eye exhibits no discharge. Left eye exhibits no discharge.  Neck: Neck supple.  Cardiovascular: Normal heart sounds and intact distal pulses.   Pulmonary/Chest: Effort normal and breath sounds normal. She exhibits no tenderness.  Abdominal: Soft. Bowel sounds are normal. There is no tenderness. There is no rebound.  Musculoskeletal: Normal range of motion. She exhibits no tenderness.       Baseline ROM, no obvious new focal weakness.  Neurological: She is alert and oriented to person, place, and time.       Mental status and motor strength appears baseline for patient and situation.  Skin: No rash noted.  Psychiatric: She has a normal mood and affect.    ED Course  Procedures (including critical care time)   Date: 07/01/2012  0230  Rate: 95  Rhythm: normal sinus rhythm and premature ventricular contractions (PVC)  QRS Axis: normal  Intervals: normal  ST/T Wave abnormalities: normal  Conduction Disutrbances:none  Narrative Interpretation:   Old EKG Reviewed: unchanged c/w 12/20/2011   MDM  Patient with chronic pain and anxiety that took extra analgesics for her back pain resulting in a tachycardia slow to respond to xanax. Upon arrival in the ER she was at her baseline and did not feel she needed any further treatment. EKG was normal. Patient was observed in the ER with no further episodes of tachycardia. Pt stable in ED with no significant deterioration in condition.The patient appears reasonably screened and/or stabilized for discharge and I doubt any other medical condition or other [facility] requiring further screening, evaluation, or treatment in the ED at this time prior to discharge.  MDM Reviewed: nursing note and vitals Interpretation: ECG           Nicoletta Dress. Colon Branch, MD 07/01/12 1610

## 2012-07-01 NOTE — ED Notes (Addendum)
Pt reports she took 2 10mg  Percocets approx 3 hours ago (states she took a total of 2 tablets over the time span of approx 20 min because her pain would not stop).  States shortly after, she had a panic attack-states heart was racing, felt dizzy and faint; pt then took Xanax 2mg  x 3 over the span of 30 minutes approx 2 hours ago; pt is alert, answering questions appropriately; denies thoughts of harm to self. Reports she feels "fine" now-denies palpitations, dizziness, pain, shortness of breath.

## 2012-07-01 NOTE — ED Notes (Signed)
Instructions reviewed and f/u information provided-verbalizes understanding. Alert, oriented x 4; in no distress; ambulatory with steady gait.

## 2012-07-05 ENCOUNTER — Telehealth: Payer: Self-pay

## 2012-07-05 NOTE — Telephone Encounter (Signed)
Noted, please let her know she needs only to take med as prescribed , and no early fills will be available on any medication

## 2012-07-15 ENCOUNTER — Other Ambulatory Visit: Payer: Self-pay

## 2012-07-15 MED ORDER — OXYCODONE-ACETAMINOPHEN 10-650 MG PO TABS: 1.0000 | ORAL_TABLET | Freq: Four times a day (QID) | ORAL | Status: DC | PRN

## 2012-07-27 ENCOUNTER — Telehealth: Payer: Self-pay | Admitting: Family Medicine

## 2012-07-27 NOTE — Telephone Encounter (Signed)
Ready to be collected  

## 2012-08-10 ENCOUNTER — Telehealth: Payer: Self-pay | Admitting: Family Medicine

## 2012-08-10 NOTE — Telephone Encounter (Signed)
Noted  

## 2012-08-13 ENCOUNTER — Other Ambulatory Visit: Payer: Self-pay

## 2012-08-13 MED ORDER — OXYCODONE-ACETAMINOPHEN 10-650 MG PO TABS: 1.0000 | ORAL_TABLET | Freq: Four times a day (QID) | ORAL | Status: DC | PRN

## 2012-08-27 ENCOUNTER — Ambulatory Visit: Payer: Medicare Other | Admitting: Family Medicine

## 2012-09-14 ENCOUNTER — Other Ambulatory Visit: Payer: Self-pay

## 2012-09-14 MED ORDER — OXYCODONE-ACETAMINOPHEN 10-650 MG PO TABS: 1.0000 | ORAL_TABLET | Freq: Four times a day (QID) | ORAL | Status: DC | PRN

## 2012-09-29 ENCOUNTER — Ambulatory Visit: Payer: Medicare Other | Admitting: Family Medicine

## 2012-10-15 ENCOUNTER — Other Ambulatory Visit: Payer: Self-pay

## 2012-10-15 MED ORDER — OXYCODONE-ACETAMINOPHEN 10-650 MG PO TABS: 1.0000 | ORAL_TABLET | Freq: Four times a day (QID) | ORAL | Status: DC | PRN

## 2012-10-18 ENCOUNTER — Ambulatory Visit (INDEPENDENT_AMBULATORY_CARE_PROVIDER_SITE_OTHER): Payer: Medicare Other | Admitting: Family Medicine

## 2012-10-18 ENCOUNTER — Encounter: Payer: Self-pay | Admitting: Family Medicine

## 2012-10-18 VITALS — BP 138/80 | HR 100 | Resp 15 | Ht 68.0 in | Wt 198.4 lb

## 2012-10-18 DIAGNOSIS — E785 Hyperlipidemia, unspecified: Secondary | ICD-10-CM | POA: Diagnosis not present

## 2012-10-18 DIAGNOSIS — E669 Obesity, unspecified: Secondary | ICD-10-CM

## 2012-10-18 DIAGNOSIS — R5381 Other malaise: Secondary | ICD-10-CM | POA: Diagnosis not present

## 2012-10-18 DIAGNOSIS — F329 Major depressive disorder, single episode, unspecified: Secondary | ICD-10-CM

## 2012-10-18 DIAGNOSIS — F17200 Nicotine dependence, unspecified, uncomplicated: Secondary | ICD-10-CM

## 2012-10-18 DIAGNOSIS — I10 Essential (primary) hypertension: Secondary | ICD-10-CM | POA: Diagnosis not present

## 2012-10-18 DIAGNOSIS — R5383 Other fatigue: Secondary | ICD-10-CM

## 2012-10-18 DIAGNOSIS — F3289 Other specified depressive episodes: Secondary | ICD-10-CM
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
## 2012-10-18 NOTE — Assessment & Plan Note (Signed)
Controlled, no change in medication DASH diet and commitment to daily physical activity for a minimum of 30 minutes discussed and encouraged, as a part of hypertension management. The importance of attaining a healthy weight is also discussed.  

## 2012-10-18 NOTE — Assessment & Plan Note (Signed)
Improved. Pt applauded on succesful weight loss through lifestyle change, and encouraged to continue same. Weight loss goal set for the next several months.  

## 2012-10-18 NOTE — Patient Instructions (Addendum)
F/U in mid January.  Please make appointment for home health mental health nurse through Dr.Headen's office, and with [facility]. CALL TODAY  Meds as before.  Fasting cbc, chem 7, lipid and TSH in January before visit.  You need to stop smoking to reduce your risk of heart disease, stroke and cancer.  Mammogram is past due please schedule as soon as possible.  Please start going back to [facility] since this helps you to do the right thing

## 2012-10-18 NOTE — Assessment & Plan Note (Signed)
Uncontrolled. Updated labs needed Hyperlipidemia:Low fat diet discussed and encouraged.

## 2012-10-18 NOTE — Assessment & Plan Note (Signed)
Unchanged. Patient counseled for approximately 5 minutes regarding the health risks of ongoing nicotine use, specifically all types of cancer, heart disease, stroke and respiratory failure. The options available for help with cessation ,the behavioral changes to assist the process, and the option to either gradully reduce usage  Or abruptly stop.is also discussed. Pt is also encouraged to set specific goals in number of cigarettes used daily, as well as to set a quit date.  

## 2012-10-25 ENCOUNTER — Telehealth: Payer: Self-pay | Admitting: Family Medicine

## 2012-10-25 ENCOUNTER — Encounter (HOSPITAL_COMMUNITY): Payer: Self-pay | Admitting: Emergency Medicine

## 2012-10-25 ENCOUNTER — Emergency Department (HOSPITAL_COMMUNITY)
Admission: EM | Admit: 2012-10-25 | Discharge: 2012-10-25 | Disposition: A | Discharge status: Home / Self Care | Payer: Medicare Other | Attending: Emergency Medicine | Admitting: Emergency Medicine

## 2012-10-25 DIAGNOSIS — E785 Hyperlipidemia, unspecified: Secondary | ICD-10-CM | POA: Insufficient documentation

## 2012-10-25 DIAGNOSIS — Z8709 Personal history of other diseases of the respiratory system: Secondary | ICD-10-CM | POA: Diagnosis not present

## 2012-10-25 DIAGNOSIS — Z79899 Other long term (current) drug therapy: Secondary | ICD-10-CM | POA: Diagnosis not present

## 2012-10-25 DIAGNOSIS — E669 Obesity, unspecified: Secondary | ICD-10-CM | POA: Insufficient documentation

## 2012-10-25 DIAGNOSIS — F319 Bipolar disorder, unspecified: Secondary | ICD-10-CM | POA: Insufficient documentation

## 2012-10-25 DIAGNOSIS — IMO0002 Reserved for concepts with insufficient information to code with codable children: Secondary | ICD-10-CM | POA: Insufficient documentation

## 2012-10-25 DIAGNOSIS — M5416 Radiculopathy, lumbar region: Secondary | ICD-10-CM

## 2012-10-25 DIAGNOSIS — M79609 Pain in unspecified limb: Secondary | ICD-10-CM | POA: Insufficient documentation

## 2012-10-25 DIAGNOSIS — I10 Essential (primary) hypertension: Secondary | ICD-10-CM | POA: Diagnosis not present

## 2012-10-25 DIAGNOSIS — F410 Panic disorder [episodic paroxysmal anxiety] without agoraphobia: Secondary | ICD-10-CM | POA: Insufficient documentation

## 2012-10-25 DIAGNOSIS — F329 Major depressive disorder, single episode, unspecified: Secondary | ICD-10-CM | POA: Insufficient documentation

## 2012-10-25 DIAGNOSIS — G8929 Other chronic pain: Secondary | ICD-10-CM | POA: Diagnosis not present

## 2012-10-25 DIAGNOSIS — F17200 Nicotine dependence, unspecified, uncomplicated: Secondary | ICD-10-CM | POA: Insufficient documentation

## 2012-10-25 DIAGNOSIS — K219 Gastro-esophageal reflux disease without esophagitis: Secondary | ICD-10-CM | POA: Insufficient documentation

## 2012-10-25 DIAGNOSIS — F3289 Other specified depressive episodes: Secondary | ICD-10-CM | POA: Insufficient documentation

## 2012-10-25 MED ORDER — DICLOFENAC SODIUM 1 % TD GEL: 4.0000 g | Freq: Four times a day (QID) | TRANSDERMAL | Status: DC

## 2012-10-25 MED ORDER — CAPSAICIN 0.075 % EX CREA: TOPICAL_CREAM | Freq: Three times a day (TID) | CUTANEOUS | Status: DC

## 2012-10-25 MED ORDER — HYDROMORPHONE HCL PF 1 MG/ML IJ SOLN
1.0000 mg | Freq: Once | INTRAMUSCULAR | Status: AC
  Administered 2012-10-25: 1 mg via INTRAMUSCULAR
  Filled 2012-10-25: qty 1

## 2012-10-25 MED ORDER — CYCLOBENZAPRINE HCL 5 MG PO TABS: 5.0000 mg | ORAL_TABLET | Freq: Three times a day (TID) | ORAL | Status: DC | PRN

## 2012-10-25 NOTE — ED Notes (Signed)
Left leg and back pain. Pain shooting down leg.has history of same on right side. Pt has been taking percocet 10mg /650mg  has pain has not gotten any better
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
## 2012-10-25 NOTE — ED Provider Notes (Signed)
Medical screening examination/treatment/procedure(s) were performed by non-physician practitioner and as supervising physician I was immediately available for consultation/collaboration.  Jones Skene, M.D.     Jones Skene, MD 10/25/12 1655

## 2012-10-25 NOTE — Telephone Encounter (Signed)
Noted  

## 2012-10-26 ENCOUNTER — Telehealth: Payer: Self-pay

## 2012-10-26 NOTE — Telephone Encounter (Signed)
No xray of leg needed She can use the muscle relaxer

## 2012-10-26 NOTE — Telephone Encounter (Signed)
Called and left message for patient notifying her the no xray would be ordered.

## 2012-11-02 ENCOUNTER — Telehealth: Payer: Self-pay

## 2012-11-02 NOTE — Telephone Encounter (Signed)
Message left (as pt requested)  for pt to call back and make appt

## 2012-11-02 NOTE — Telephone Encounter (Signed)
States about 2 days after she was here she started getting very sharp pains in her left leg. Would start in left foot and shoot all the way up to her back- Went to ED but they didn't do any xrays and told her it was arthritis. States she can't stand the pain and wants to know what is going on with it. Wants xray done. Want her to come back to the office first or can one be ordered

## 2012-11-02 NOTE — Telephone Encounter (Signed)
Needs appt for evaluation of and treatment for back pain in office before tests can be ordered, please let her know. We have openeings this week

## 2012-11-03 ENCOUNTER — Encounter: Payer: Self-pay | Admitting: Family Medicine

## 2012-11-03 ENCOUNTER — Ambulatory Visit (INDEPENDENT_AMBULATORY_CARE_PROVIDER_SITE_OTHER): Payer: Medicare Other | Admitting: Family Medicine

## 2012-11-03 ENCOUNTER — Ambulatory Visit (HOSPITAL_COMMUNITY)
Admission: RE | Admit: 2012-11-03 | Discharge: 2012-11-03 | Disposition: A | Discharge status: Home / Self Care | Payer: Medicare Other | Source: Ambulatory Visit | Attending: Family Medicine | Admitting: Family Medicine

## 2012-11-03 VITALS — BP 140/90 | HR 100 | Resp 16 | Ht 68.0 in | Wt 196.1 lb

## 2012-11-03 DIAGNOSIS — F329 Major depressive disorder, single episode, unspecified: Secondary | ICD-10-CM

## 2012-11-03 DIAGNOSIS — IMO0002 Reserved for concepts with insufficient information to code with codable children: Secondary | ICD-10-CM | POA: Diagnosis not present

## 2012-11-03 DIAGNOSIS — M5137 Other intervertebral disc degeneration, lumbosacral region: Secondary | ICD-10-CM | POA: Diagnosis not present

## 2012-11-03 DIAGNOSIS — M545 Low back pain, unspecified: Secondary | ICD-10-CM | POA: Insufficient documentation

## 2012-11-03 DIAGNOSIS — I10 Essential (primary) hypertension: Secondary | ICD-10-CM | POA: Diagnosis not present

## 2012-11-03 DIAGNOSIS — M47817 Spondylosis without myelopathy or radiculopathy, lumbosacral region: Secondary | ICD-10-CM | POA: Diagnosis not present

## 2012-11-03 DIAGNOSIS — M79609 Pain in unspecified limb: Secondary | ICD-10-CM | POA: Diagnosis not present

## 2012-11-03 DIAGNOSIS — M5430 Sciatica, unspecified side: Secondary | ICD-10-CM

## 2012-11-03 DIAGNOSIS — F17200 Nicotine dependence, unspecified, uncomplicated: Secondary | ICD-10-CM

## 2012-11-03 DIAGNOSIS — M5432 Sciatica, left side: Secondary | ICD-10-CM

## 2012-11-03 MED ORDER — PREDNISONE (PAK) 10 MG PO TABS: 10.0000 mg | ORAL_TABLET | Freq: Every day | ORAL | Status: DC

## 2012-11-03 MED ORDER — METHYLPREDNISOLONE ACETATE 80 MG/ML IJ SUSP
120.0000 mg | Freq: Once | INTRAMUSCULAR | Status: AC
  Administered 2012-11-03: 120 mg via INTRAMUSCULAR

## 2012-11-03 MED ORDER — NAPROXEN 500 MG PO TABS: 500.0000 mg | ORAL_TABLET | Freq: Two times a day (BID) | ORAL | Status: DC

## 2012-11-03 MED ORDER — KETOROLAC TROMETHAMINE 60 MG/2ML IJ SOLN
60.0000 mg | Freq: Once | INTRAMUSCULAR | Status: AC
  Administered 2012-11-03: 60 mg via INTRAMUSCULAR

## 2012-11-03 NOTE — Progress Notes (Signed)
  Subjective:    Patient ID: Susan Walker, female    DOB: Apr 23, 1961, 51 y.o.   MRN: [id number]  HPI Pt in as f/u from recent Ed visit c/o severe and uncontrolled lower extremity pain from back, worst pain ever. Denies incontinence of stool or urine. Denies lower extremity weakness or numbness, has severe disc disease in her . No aggravating factor started her pain   Review of Systems See HPI Denies recent fever or chills. Denies sinus pressure, nasal congestion, ear pain or sore throat. Denies chest congestion, productive cough or wheezing. Denies chest pains, palpitations and leg swelling Denies abdominal pain, nausea, vomiting,diarrhea or constipation.   Denies dysuria, frequency, hesitancy or incontinence. Denies headaches Worsened depression, anxiety and  Insomnia with new severe lower extremity pain Denies skin break down or rash.        Objective:   Physical ExamPt in severe pain Patient alert and oriented and in no cardiopulmonary distress.  HEENT: No facial asymmetry, EOMI, no sinus tenderness,  oropharynx pink and moist.  Neck supple no adenopathy.  Chest: Clear to auscultation bilaterally.  CVS: S1, S2 no murmurs, no S3.  ABD: Soft non tender. Bowel sounds normal.  Ext: No edema  [id number]  ROM spine, adequate in shoulders, hips and knees.  Skin: Intact, no ulcerations or rash noted.  Psych: Good eye contact, normal affect. Memory intact  anxious and  depressed appearing.  CNS: CN 2-12 intact, power, tone and sensation normal throughout.        Assessment & Plan:

## 2012-11-03 NOTE — Patient Instructions (Addendum)
F/u as before.  You are being treated for acute sciatica. Toradol 60mg  im and depo medrol 120 mg Im in the office today.  Medication is also sent for your pain.  Please get xray today

## 2012-11-08 ENCOUNTER — Telehealth: Payer: Self-pay | Admitting: Family Medicine

## 2012-11-08 DIAGNOSIS — M5432 Sciatica, left side: Secondary | ICD-10-CM

## 2012-11-08 MED ORDER — PREDNISONE (PAK) 10 MG PO TABS: 10.0000 mg | ORAL_TABLET | Freq: Every day | ORAL | Status: AC

## 2012-11-08 NOTE — Telephone Encounter (Signed)
Refilled

## 2012-11-08 NOTE — Assessment & Plan Note (Signed)
Severe and uncontrolled, acute course ofanti inflammatories , ma need epidural, pt to call back if needed

## 2012-11-08 NOTE — Assessment & Plan Note (Signed)
Elevated BP at this visit, pt in severe pain. No med change at this time

## 2012-11-08 NOTE — Telephone Encounter (Signed)
Refill prednisone dose pack x 1 please and let her know

## 2012-11-08 NOTE — Telephone Encounter (Signed)
Please advise 

## 2012-11-08 NOTE — Assessment & Plan Note (Signed)
Severe acute pain , depomedrol in office followed by oral steroids

## 2012-11-08 NOTE — Assessment & Plan Note (Signed)
Unchanged. Patient counseled for approximately 5 minutes regarding the health risks of ongoing nicotine use, specifically all types of cancer, heart disease, stroke and respiratory failure. The options available for help with cessation ,the behavioral changes to assist the process, and the option to either gradully reduce usage  Or abruptly stop.is also discussed. Pt is also encouraged to set specific goals in number of cigarettes used daily, as well as to set a quit date.  

## 2012-11-08 NOTE — Assessment & Plan Note (Signed)
Increase anxiety due to new severe pain. Not suicidal or homicidal, continues to follow with psych

## 2012-11-09 ENCOUNTER — Telehealth: Payer: Self-pay | Admitting: Family Medicine

## 2012-11-09 NOTE — Telephone Encounter (Signed)
Left message that Dr. Lacinda Axon office will call her with an appointment

## 2012-11-12 ENCOUNTER — Other Ambulatory Visit: Payer: Self-pay

## 2012-11-12 MED ORDER — OXYCODONE-ACETAMINOPHEN 10-650 MG PO TABS: 1.0000 | ORAL_TABLET | Freq: Four times a day (QID) | ORAL | Status: DC | PRN

## 2012-11-25 ENCOUNTER — Telehealth: Payer: Self-pay | Admitting: Family Medicine

## 2012-11-25 NOTE — Telephone Encounter (Signed)
Needs hepatic panel done to see if liver is being bothered by the tylenol, and will need to stay on current meds until a visit to change the pain meds is kept as is stated on her pain contract  pls order the lab and let her know

## 2012-11-25 NOTE — Telephone Encounter (Signed)
noted 

## 2012-11-25 NOTE — Telephone Encounter (Signed)
Patient states that Medicaid has informed her that she comes to the doctor too much so she is going to just wait until February to followup on pain medicine.  States that she is going to wait on blood work until then as well.

## 2012-11-25 NOTE — Telephone Encounter (Signed)
Called in upset, states the tylenol in her pain meds has been hurting her stomach and wants something for pain without the tylenol.

## 2012-12-10 ENCOUNTER — Other Ambulatory Visit: Payer: Self-pay

## 2012-12-10 MED ORDER — OXYCODONE-ACETAMINOPHEN 10-325 MG PO TABS: 1.0000 | ORAL_TABLET | Freq: Four times a day (QID) | ORAL | Status: DC | PRN

## 2012-12-13 ENCOUNTER — Other Ambulatory Visit: Payer: Self-pay

## 2012-12-13 DIAGNOSIS — I10 Essential (primary) hypertension: Secondary | ICD-10-CM

## 2012-12-13 MED ORDER — AMLODIPINE BESYLATE 10 MG PO TABS: 10.0000 mg | ORAL_TABLET | Freq: Every day | ORAL | Status: DC

## 2013-01-07 ENCOUNTER — Telehealth: Payer: Self-pay

## 2013-01-07 ENCOUNTER — Other Ambulatory Visit: Payer: Self-pay

## 2013-01-07 MED ORDER — OXYCODONE-ACETAMINOPHEN 10-325 MG PO TABS: 1.0000 | ORAL_TABLET | Freq: Four times a day (QID) | ORAL | Status: DC | PRN

## 2013-01-07 NOTE — Telephone Encounter (Signed)
Pt called office wanting to collect her narcotic pain medication 1 week early. She was told by Toni Amend that she could collect it Monday. Proceeded to beg nurse to please call Dr Lodema Hong to come to the office to sign. Advised her no that Dr Lodema Hong was off this afternoon and her med was too early. Asked to speak to other nurse. Was again given the same information. Advised if she was in that much pain to go to ED. She then calls [facility] ED to have Dr Lodema Hong paged. They told her out office was open until 5 and called to let us know what she had did.

## 2013-01-07 NOTE — Telephone Encounter (Signed)
I spoke with patient regarding her pain contract and her medication she picked up her medicine on January 13th she cannot get her medication filled until next week as discussed with her by the nursing staff. She states that she uses a few extra ones when she has bad days and she cannot get out of bed therefore she only has 2 tablets left. I advised her she will need to schedule appointment with her primary care provider to discuss any change in her medication but that I would not override and allow her to get her Percocet early. It is noted that she call to [facility] to try to have Dr. Lodema Hong patient comes find a prescription for her. She states that she never called in early to get her medications however it appears this is not so. She has an appointment on February 21 with her primary care provider to discuss her medications.

## 2013-01-24 ENCOUNTER — Telehealth: Payer: Self-pay | Admitting: Family Medicine

## 2013-01-26 NOTE — Telephone Encounter (Signed)
Patient just wanted to discuss rescheduling appointment.

## 2013-01-27 ENCOUNTER — Ambulatory Visit: Payer: Medicare Other | Admitting: Family Medicine

## 2013-01-31 ENCOUNTER — Ambulatory Visit: Payer: Medicare Other | Admitting: Family Medicine

## 2013-02-01 ENCOUNTER — Other Ambulatory Visit: Payer: Self-pay

## 2013-02-01 MED ORDER — OXYCODONE-ACETAMINOPHEN 10-325 MG PO TABS: 1.0000 | ORAL_TABLET | Freq: Four times a day (QID) | ORAL | Status: DC | PRN

## 2013-02-15 ENCOUNTER — Ambulatory Visit: Payer: Medicare Other | Admitting: Family Medicine

## 2013-02-25 ENCOUNTER — Ambulatory Visit: Payer: Medicare Other | Admitting: Family Medicine

## 2013-03-02 ENCOUNTER — Ambulatory Visit: Payer: Medicare Other | Admitting: Family Medicine

## 2013-03-04 ENCOUNTER — Other Ambulatory Visit: Payer: Self-pay

## 2013-03-04 MED ORDER — OXYCODONE-ACETAMINOPHEN 10-325 MG PO TABS: 1.0000 | ORAL_TABLET | Freq: Four times a day (QID) | ORAL | Status: DC | PRN

## 2013-03-07 ENCOUNTER — Encounter: Payer: Self-pay | Admitting: Family Medicine

## 2013-03-07 ENCOUNTER — Ambulatory Visit (INDEPENDENT_AMBULATORY_CARE_PROVIDER_SITE_OTHER): Payer: Medicare Other | Admitting: Family Medicine

## 2013-03-07 VITALS — BP 130/84 | HR 84 | Resp 16 | Ht 68.0 in | Wt 187.4 lb

## 2013-03-07 DIAGNOSIS — E669 Obesity, unspecified: Secondary | ICD-10-CM

## 2013-03-07 DIAGNOSIS — I10 Essential (primary) hypertension: Secondary | ICD-10-CM

## 2013-03-07 DIAGNOSIS — Z9119 Patient's noncompliance with other medical treatment and regimen: Secondary | ICD-10-CM

## 2013-03-07 DIAGNOSIS — E785 Hyperlipidemia, unspecified: Secondary | ICD-10-CM | POA: Diagnosis not present

## 2013-03-07 DIAGNOSIS — F329 Major depressive disorder, single episode, unspecified: Secondary | ICD-10-CM

## 2013-03-07 DIAGNOSIS — IMO0002 Reserved for concepts with insufficient information to code with codable children: Secondary | ICD-10-CM

## 2013-03-07 DIAGNOSIS — E049 Nontoxic goiter, unspecified: Secondary | ICD-10-CM | POA: Diagnosis not present

## 2013-03-07 DIAGNOSIS — F17200 Nicotine dependence, unspecified, uncomplicated: Secondary | ICD-10-CM

## 2013-03-07 NOTE — Patient Instructions (Addendum)
Annual wellness in 4 month  You need to stop smoking , to reduce your risk of cancer, heart disease and stroke  You need to stop drinking   When you decide you need inpatient mental health help pls go to the emergency room  Pain medication as per contract  You need labs before the next 4 weeks, cbc, lipid, cmp and TSh , fasting
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
## 2013-03-14 DIAGNOSIS — Z9119 Patient's noncompliance with other medical treatment and regimen: Secondary | ICD-10-CM | POA: Insufficient documentation
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
## 2013-03-14 NOTE — Assessment & Plan Note (Signed)
Deteriorated Patient counseled for approximately 5 minutes regarding the health risks of ongoing nicotine use, specifically all types of cancer, heart disease, stroke and respiratory failure. The options available for help with cessation ,the behavioral changes to assist the process, and the option to either gradully reduce usage  Or abruptly stop.is also discussed. Pt is also encouraged to set specific goals in number of cigarettes used daily, as well as to set a quit date.    

## 2013-03-14 NOTE — Assessment & Plan Note (Signed)
Controlled, no change in medication  

## 2013-03-14 NOTE — Assessment & Plan Note (Signed)
Unchanged and chronic, pain medication as before

## 2013-03-14 NOTE — Assessment & Plan Note (Signed)
Pt continues to be non compliant, still has not had labs which are past due she is again made aware of the need to do so.  Cancer screening also not up to date

## 2013-03-15 ENCOUNTER — Other Ambulatory Visit: Payer: Self-pay | Admitting: Family Medicine

## 2013-03-15 ENCOUNTER — Encounter: Payer: Self-pay | Admitting: Family Medicine

## 2013-03-15 ENCOUNTER — Ambulatory Visit (INDEPENDENT_AMBULATORY_CARE_PROVIDER_SITE_OTHER): Payer: Medicare Other | Admitting: Family Medicine

## 2013-03-15 VITALS — BP 122/80 | HR 90 | Resp 16 | Ht 68.0 in | Wt 189.1 lb

## 2013-03-15 DIAGNOSIS — M5430 Sciatica, unspecified side: Secondary | ICD-10-CM

## 2013-03-15 DIAGNOSIS — F329 Major depressive disorder, single episode, unspecified: Secondary | ICD-10-CM

## 2013-03-15 DIAGNOSIS — R7301 Impaired fasting glucose: Secondary | ICD-10-CM | POA: Diagnosis not present

## 2013-03-15 DIAGNOSIS — E8881 Metabolic syndrome: Secondary | ICD-10-CM | POA: Diagnosis not present

## 2013-03-15 DIAGNOSIS — I10 Essential (primary) hypertension: Secondary | ICD-10-CM | POA: Diagnosis not present

## 2013-03-15 DIAGNOSIS — F17200 Nicotine dependence, unspecified, uncomplicated: Secondary | ICD-10-CM

## 2013-03-15 DIAGNOSIS — E785 Hyperlipidemia, unspecified: Secondary | ICD-10-CM | POA: Diagnosis not present

## 2013-03-15 DIAGNOSIS — N390 Urinary tract infection, site not specified: Secondary | ICD-10-CM

## 2013-03-15 DIAGNOSIS — E049 Nontoxic goiter, unspecified: Secondary | ICD-10-CM | POA: Diagnosis not present

## 2013-03-15 DIAGNOSIS — E669 Obesity, unspecified: Secondary | ICD-10-CM | POA: Diagnosis not present

## 2013-03-15 DIAGNOSIS — M5432 Sciatica, left side: Secondary | ICD-10-CM

## 2013-03-15 LAB — POCT URINALYSIS DIPSTICK
Blood, UA: NEGATIVE
Glucose, UA: NEGATIVE
Protein, UA: 100
Spec Grav, UA: >=1.03
Urobilinogen, UA: 0.2
pH, UA: 5.5

## 2013-03-15 LAB — COMPREHENSIVE METABOLIC PANEL
ALT: 15 U/L (ref 0–35)
AST: 16 U/L (ref 0–37)
Albumin: 4.9 g/dL (ref 3.5–5.2)
Alkaline Phosphatase: 134 U/L — ABNORMAL HIGH (ref 39–117)
Potassium: 4.2 mEq/L (ref 3.5–5.3)
Sodium: 140 mEq/L (ref 135–145)
Total Protein: 7.7 g/dL (ref 6.0–8.3)

## 2013-03-15 LAB — CBC
MCHC: 35.5 g/dL (ref 30.0–36.0)
RDW: 13.5 % (ref 11.5–15.5)
WBC: 4.7 10*3/uL (ref 4.0–10.5)

## 2013-03-15 LAB — LIPID PANEL
HDL: 48 mg/dL (ref >39)
LDL Cholesterol: 193 mg/dL — ABNORMAL HIGH (ref 0–99)
Total CHOL/HDL Ratio: 6 Ratio

## 2013-03-15 LAB — TSH: TSH: 0.306 u[IU]/mL — ABNORMAL LOW (ref 0.350–4.500)

## 2013-03-15 NOTE — Progress Notes (Signed)
  Subjective:    Patient ID: Susan Walker, female    DOB: 1961/05/28, 52 y.o.   MRN: [id number]  HPI  Manson Passey urine over 1 month  Lower abdominal pain several month, pt states she she takes mirilax daily for the past 6 month, and has no bM without stopping the pain medicine, which is not really helping her pain and causing constipation. Wants to change her pain medication, states she "tried someone else's medication" and is requesting oxycontin, since she believes tylenon is constipating her. I made it clear this is not the case, she needs to see pain clinic, have told her in the past , she refuses, only option for her is trhe fentanyl patch, from me, she will start this next month, I will make it clear that if that fails she will get no pain meds from me. I will also sart once daily muscle relaxant and NSAID. When gabapentin menioned she stated that made her sick  Review of Systems Chronic smoker's cough chronic abdominal pain and constipation Brown urine, denie flank pain and chills x 1 month Chronic depression not suicidal or homicidal     Objective:   Physical Exam Patient alert and oriented and in no cardiopulmonary distress.  HEENT: No facial asymmetry, EOMI, no sinus tenderness,  oropharynx pink and moist.  Neck supple no adenopathy.  Chest: Clear to auscultation bilaterally.  CVS: S1, S2 no murmurs, no S3.  ABD: Softlowe abdominal tenderness, no guarding or rebound. Bowel sounds normal.  Ext: No edema        Assessment & Plan:

## 2013-03-15 NOTE — Patient Instructions (Addendum)
F/U May 5  Please have labs today.  Pain management moving forward is as has been discussed.  Please schedule your mammogram and have it done  Continue daily mirilax, stool softeners, and a laxative every 3 days as needed for constipation

## 2013-03-16 DIAGNOSIS — E8881 Metabolic syndrome: Secondary | ICD-10-CM | POA: Insufficient documentation

## 2013-03-16 LAB — HEMOGLOBIN A1C: Hgb A1c MFr Bld: 5.4 % (ref <5.7)

## 2013-03-17 ENCOUNTER — Telehealth: Payer: Self-pay | Admitting: Family Medicine

## 2013-03-17 DIAGNOSIS — N390 Urinary tract infection, site not specified: Secondary | ICD-10-CM | POA: Insufficient documentation

## 2013-03-17 NOTE — Assessment & Plan Note (Signed)
Controlled, no change in medication  

## 2013-03-17 NOTE — Assessment & Plan Note (Signed)
Lower abdominal pain  and brown urine, CCUA negative for infection

## 2013-03-17 NOTE — Assessment & Plan Note (Signed)
Unchnaged, uncontrolled, not suicidal or homicidal, continue with psych

## 2013-03-17 NOTE — Assessment & Plan Note (Signed)
Uncontrolled, non compliant with meds. Hyperlipidemia:Low fat diet discussed and encouraged.  New script to be sent once labs obtained

## 2013-03-17 NOTE — Telephone Encounter (Signed)
Please let pt know her cholesterol is very high , otherwise labs are good. She needs to reduce fried an fatty foods. I am entering pravachol, pls send after you spk with her, she needs to take this also

## 2013-03-17 NOTE — Assessment & Plan Note (Signed)
Reports uncontrolled pain, intolerance to med causing constipation, so reports not taking med as prescribed and using other people's pain med at times Refusing to go to pain clinic, states I don't care anymore" Onlyy option from me id fentanyl which will not be started before pain script next due, and this is made clear, she needs to see pain specialist, mentally unstable and severely depressed most of the time

## 2013-03-17 NOTE — Assessment & Plan Note (Signed)
Unchanged. Patient counseled for approximately 5 minutes regarding the health risks of ongoing nicotine use, specifically all types of cancer, heart disease, stroke and respiratory failure. The options available for help with cessation ,the behavioral changes to assist the process, and the option to either gradully reduce usage  Or abruptly stop.is also discussed. Pt is also encouraged to set specific goals in number of cigarettes used daily, as well as to set a quit date.  

## 2013-03-23 ENCOUNTER — Other Ambulatory Visit: Payer: Self-pay

## 2013-03-23 DIAGNOSIS — E785 Hyperlipidemia, unspecified: Secondary | ICD-10-CM

## 2013-03-23 MED ORDER — PRAVASTATIN SODIUM 40 MG PO TABS: 40.0000 mg | ORAL_TABLET | Freq: Every evening | ORAL | Status: DC

## 2013-03-23 NOTE — Telephone Encounter (Signed)
Patient aware and med sent  

## 2013-03-28 ENCOUNTER — Encounter (HOSPITAL_COMMUNITY): Payer: Self-pay | Admitting: Emergency Medicine

## 2013-03-28 ENCOUNTER — Inpatient Hospital Stay (HOSPITAL_COMMUNITY)
Admission: EM | Admit: 2013-03-28 | Discharge: 2013-03-31 | DRG: 885 | Disposition: A | Discharge status: Home / Self Care | Payer: Medicare Other | Source: Intra-hospital | Attending: Psychiatry | Admitting: Psychiatry

## 2013-03-28 ENCOUNTER — Emergency Department (HOSPITAL_COMMUNITY)
Admission: EM | Admit: 2013-03-28 | Discharge: 2013-03-28 | Disposition: A | Discharge status: Home / Self Care | Payer: Medicare Other | Source: Home / Self Care | Attending: Emergency Medicine | Admitting: Emergency Medicine

## 2013-03-28 ENCOUNTER — Telehealth: Payer: Self-pay

## 2013-03-28 ENCOUNTER — Emergency Department (HOSPITAL_COMMUNITY): Payer: Medicare Other

## 2013-03-28 ENCOUNTER — Encounter (HOSPITAL_COMMUNITY): Payer: Self-pay | Admitting: Behavioral Health

## 2013-03-28 DIAGNOSIS — E8881 Metabolic syndrome: Secondary | ICD-10-CM

## 2013-03-28 DIAGNOSIS — R279 Unspecified lack of coordination: Secondary | ICD-10-CM | POA: Diagnosis not present

## 2013-03-28 DIAGNOSIS — F17200 Nicotine dependence, unspecified, uncomplicated: Secondary | ICD-10-CM | POA: Diagnosis present

## 2013-03-28 DIAGNOSIS — Z79899 Other long term (current) drug therapy: Secondary | ICD-10-CM

## 2013-03-28 DIAGNOSIS — R45851 Suicidal ideations: Secondary | ICD-10-CM

## 2013-03-28 DIAGNOSIS — F329 Major depressive disorder, single episode, unspecified: Secondary | ICD-10-CM

## 2013-03-28 DIAGNOSIS — R109 Unspecified abdominal pain: Secondary | ICD-10-CM | POA: Diagnosis not present

## 2013-03-28 DIAGNOSIS — I10 Essential (primary) hypertension: Secondary | ICD-10-CM | POA: Diagnosis present

## 2013-03-28 DIAGNOSIS — E785 Hyperlipidemia, unspecified: Secondary | ICD-10-CM | POA: Diagnosis present

## 2013-03-28 DIAGNOSIS — E669 Obesity, unspecified: Secondary | ICD-10-CM

## 2013-03-28 DIAGNOSIS — F419 Anxiety disorder, unspecified: Secondary | ICD-10-CM | POA: Diagnosis present

## 2013-03-28 DIAGNOSIS — R002 Palpitations: Secondary | ICD-10-CM | POA: Diagnosis not present

## 2013-03-28 DIAGNOSIS — F410 Panic disorder [episodic paroxysmal anxiety] without agoraphobia: Secondary | ICD-10-CM | POA: Diagnosis not present

## 2013-03-28 DIAGNOSIS — F332 Major depressive disorder, recurrent severe without psychotic features: Principal | ICD-10-CM | POA: Diagnosis present

## 2013-03-28 DIAGNOSIS — R1013 Epigastric pain: Secondary | ICD-10-CM

## 2013-03-28 DIAGNOSIS — R51 Headache: Secondary | ICD-10-CM | POA: Diagnosis not present

## 2013-03-28 DIAGNOSIS — F411 Generalized anxiety disorder: Secondary | ICD-10-CM | POA: Diagnosis present

## 2013-03-28 DIAGNOSIS — E049 Nontoxic goiter, unspecified: Secondary | ICD-10-CM

## 2013-03-28 DIAGNOSIS — F3289 Other specified depressive episodes: Secondary | ICD-10-CM | POA: Diagnosis not present

## 2013-03-28 DIAGNOSIS — F32A Depression, unspecified: Secondary | ICD-10-CM | POA: Diagnosis present

## 2013-03-28 DIAGNOSIS — R0989 Other specified symptoms and signs involving the circulatory and respiratory systems: Secondary | ICD-10-CM

## 2013-03-28 DIAGNOSIS — IMO0002 Reserved for concepts with insufficient information to code with codable children: Secondary | ICD-10-CM

## 2013-03-28 LAB — HEPATIC FUNCTION PANEL
ALT: 11 U/L (ref 0–35)
Albumin: 4.4 g/dL (ref 3.5–5.2)
Alkaline Phosphatase: 112 U/L (ref 39–117)
Total Protein: 7.9 g/dL (ref 6.0–8.3)

## 2013-03-28 LAB — CBC WITH DIFFERENTIAL/PLATELET
Basophils Absolute: 0 10*3/uL (ref 0.0–0.1)
HCT: 41.5 % (ref 36.0–46.0)
Hemoglobin: 15 g/dL (ref 12.0–15.0)
Lymphocytes Relative: 36 % (ref 12–46)
Lymphs Abs: 2.9 10*3/uL (ref 0.7–4.0)
Monocytes Absolute: 0.5 10*3/uL (ref 0.1–1.0)
Monocytes Relative: 6 % (ref 3–12)
Neutro Abs: 4.4 10*3/uL (ref 1.7–7.7)
RBC: 4.58 MIL/uL (ref 3.87–5.11)
RDW: 12.6 % (ref 11.5–15.5)
WBC: 7.9 10*3/uL (ref 4.0–10.5)

## 2013-03-28 LAB — URINALYSIS, ROUTINE W REFLEX MICROSCOPIC
Leukocytes, UA: NEGATIVE
Nitrite: NEGATIVE
Protein, ur: 30 mg/dL — AB
Urobilinogen, UA: 1 mg/dL (ref 0.0–1.0)

## 2013-03-28 LAB — BASIC METABOLIC PANEL
CO2: 26 mEq/L (ref 19–32)
Chloride: 103 mEq/L (ref 96–112)
Creatinine, Ser: 0.7 mg/dL (ref 0.50–1.10)

## 2013-03-28 LAB — RAPID URINE DRUG SCREEN, HOSP PERFORMED
Amphetamines: NOT DETECTED
Barbiturates: NOT DETECTED
Benzodiazepines: POSITIVE — AB

## 2013-03-28 MED ORDER — FENTANYL CITRATE 0.05 MG/ML IJ SOLN
100.0000 ug | Freq: Once | INTRAMUSCULAR | Status: AC
  Administered 2013-03-28: 100 ug via INTRAVENOUS
  Filled 2013-03-28: qty 2

## 2013-03-28 MED ORDER — ONDANSETRON HCL 4 MG/2ML IJ SOLN
4.0000 mg | Freq: Once | INTRAMUSCULAR | Status: AC
  Administered 2013-03-28: 4 mg via INTRAVENOUS
  Filled 2013-03-28: qty 2

## 2013-03-28 MED ORDER — PANTOPRAZOLE SODIUM 40 MG PO TBEC
40.0000 mg | DELAYED_RELEASE_TABLET | Freq: Every day | ORAL | Status: DC
  Administered 2013-03-28: 40 mg via ORAL
  Filled 2013-03-28: qty 1

## 2013-03-28 MED ORDER — ACETAMINOPHEN 325 MG PO TABS
650.0000 mg | ORAL_TABLET | Freq: Four times a day (QID) | ORAL | Status: DC | PRN
  Administered 2013-03-29 (×2): 650 mg via ORAL

## 2013-03-28 MED ORDER — DICYCLOMINE HCL 10 MG PO CAPS
10.0000 mg | ORAL_CAPSULE | Freq: Three times a day (TID) | ORAL | Status: DC | PRN
  Filled 2013-03-28: qty 1

## 2013-03-28 MED ORDER — NICOTINE 14 MG/24HR TD PT24
14.0000 mg | MEDICATED_PATCH | Freq: Every day | TRANSDERMAL | Status: DC
  Filled 2013-03-28: qty 1

## 2013-03-28 MED ORDER — ALBUTEROL SULFATE (5 MG/ML) 0.5% IN NEBU
2.5000 mg | INHALATION_SOLUTION | Freq: Once | RESPIRATORY_TRACT | Status: AC
  Administered 2013-03-28: 2.5 mg via RESPIRATORY_TRACT
  Filled 2013-03-28: qty 0.5

## 2013-03-28 MED ORDER — DIPHENHYDRAMINE HCL 50 MG/ML IJ SOLN
25.0000 mg | Freq: Once | INTRAMUSCULAR | Status: AC
  Administered 2013-03-28: 25 mg via INTRAVENOUS
  Filled 2013-03-28: qty 1

## 2013-03-28 MED ORDER — HYDROXYZINE HCL 25 MG PO TABS
25.0000 mg | ORAL_TABLET | Freq: Once | ORAL | Status: AC
  Administered 2013-03-28: 25 mg via ORAL
  Filled 2013-03-28: qty 1

## 2013-03-28 MED ORDER — CHLORDIAZEPOXIDE HCL 25 MG PO CAPS
25.0000 mg | ORAL_CAPSULE | Freq: Four times a day (QID) | ORAL | Status: DC | PRN
  Administered 2013-03-29 – 2013-03-31 (×5): 25 mg via ORAL
  Filled 2013-03-28 (×5): qty 1

## 2013-03-28 MED ORDER — IPRATROPIUM BROMIDE 0.02 % IN SOLN
0.5000 mg | Freq: Once | RESPIRATORY_TRACT | Status: AC
  Administered 2013-03-28: 0.5 mg via RESPIRATORY_TRACT
  Filled 2013-03-28: qty 2.5

## 2013-03-28 MED ORDER — METOCLOPRAMIDE HCL 5 MG/ML IJ SOLN
10.0000 mg | Freq: Once | INTRAMUSCULAR | Status: AC
  Administered 2013-03-28: 10 mg via INTRAVENOUS
  Filled 2013-03-28: qty 2

## 2013-03-28 MED ORDER — TRAZODONE HCL 50 MG PO TABS
50.0000 mg | ORAL_TABLET | Freq: Every evening | ORAL | Status: DC | PRN
  Administered 2013-03-28 (×2): 50 mg via ORAL
  Filled 2013-03-28 (×6): qty 1

## 2013-03-28 MED ORDER — CHLORDIAZEPOXIDE HCL 25 MG PO CAPS
25.0000 mg | ORAL_CAPSULE | Freq: Once | ORAL | Status: AC
  Administered 2013-03-28: 25 mg via ORAL
  Filled 2013-03-28: qty 1

## 2013-03-28 MED ORDER — HYDROXYZINE HCL 25 MG PO TABS
25.0000 mg | ORAL_TABLET | Freq: Every evening | ORAL | Status: DC | PRN
  Administered 2013-03-29 (×2): 25 mg via ORAL

## 2013-03-28 MED ORDER — AMLODIPINE BESYLATE 10 MG PO TABS
10.0000 mg | ORAL_TABLET | Freq: Every day | ORAL | Status: DC
  Administered 2013-03-29 – 2013-03-31 (×3): 10 mg via ORAL
  Filled 2013-03-28 (×2): qty 1
  Filled 2013-03-28: qty 2
  Filled 2013-03-28 (×3): qty 1

## 2013-03-28 MED ORDER — MAGNESIUM HYDROXIDE 400 MG/5ML PO SUSP: 30.0000 mL | Freq: Every day | ORAL | Status: DC | PRN

## 2013-03-28 MED ORDER — SIMVASTATIN 40 MG PO TABS
40.0000 mg | ORAL_TABLET | Freq: Every day | ORAL | Status: DC
  Administered 2013-03-29 – 2013-03-30 (×2): 40 mg via ORAL
  Filled 2013-03-28 (×4): qty 1

## 2013-03-28 MED ORDER — NAPROXEN 500 MG PO TABS
500.0000 mg | ORAL_TABLET | Freq: Two times a day (BID) | ORAL | Status: DC
  Administered 2013-03-29 – 2013-03-30 (×3): 500 mg via ORAL
  Filled 2013-03-28 (×6): qty 1

## 2013-03-28 MED ORDER — HYDROMORPHONE HCL PF 1 MG/ML IJ SOLN
1.0000 mg | Freq: Once | INTRAMUSCULAR | Status: AC
  Administered 2013-03-28: 1 mg via INTRAVENOUS
  Filled 2013-03-28: qty 1

## 2013-03-28 MED ORDER — ALUM & MAG HYDROXIDE-SIMETH 200-200-20 MG/5ML PO SUSP
30.0000 mL | ORAL | Status: DC | PRN
  Administered 2013-03-30: 30 mL via ORAL

## 2013-03-28 MED ORDER — NICOTINE 14 MG/24HR TD PT24
14.0000 mg | MEDICATED_PATCH | Freq: Every day | TRANSDERMAL | Status: DC
  Administered 2013-03-28 – 2013-03-31 (×4): 14 mg via TRANSDERMAL
  Filled 2013-03-28 (×7): qty 1

## 2013-03-28 MED ORDER — FAMOTIDINE 20 MG PO TABS
20.0000 mg | ORAL_TABLET | Freq: Once | ORAL | Status: AC
  Administered 2013-03-28: 20 mg via ORAL
  Filled 2013-03-28: qty 1

## 2013-03-28 NOTE — ED Provider Notes (Signed)
History     CSN: [id number]  Arrival date & time 03/28/13  4098   First MD Initiated Contact with Patient 03/28/13 [id number]      Chief Complaint  Patient presents with  . Headache  . Abdominal Pain  . Depression    (Consider location/radiation/quality/duration/timing/severity/associated sxs/prior treatment) HPI Comments: Pt c/o headache, abd pain and depression. States she wants to go to sleep and NOT wake up. Request to speak with [facility]. Denies SI or HI. No plan for harming self, but does not want to wake up.  Patient is a 52 y.o. female presenting with headaches and abdominal pain. The history is provided by the patient.  Headache Pain location:  Generalized Quality: throbbing. Radiates to:  L shoulder and R shoulder Severity currently:  10/10 Severity at highest:  10/10 Onset quality:  Gradual Duration: pt is not sure. Timing:  Intermittent Progression:  Worsening Chronicity:  Recurrent Similar to prior headaches: yes   Relieved by:  Nothing Worsened by:  Activity, light and sound (Emotional stress) Ineffective treatments:  Prescription medications Associated symptoms: abdominal pain and nausea   Associated symptoms: no back pain, no cough, no dizziness, no neck pain, no photophobia and no seizures   Associated symptoms comment:  Depression and anxiety Abdominal Pain Associated symptoms: nausea   Associated symptoms: no chest pain, no cough, no dysuria, no hematuria and no shortness of breath     Past Medical History  Diagnosis Date  . Chest pain, unspecified 12/2006    Normal Echo and myovue   . Dyspnea   . Nicotine addiction   . Back pain     With radiculopathy  . Depression   . Hyperlipidemia   . Hypertension   . Panic attack   . Palpitations   . Cerebrovascular disease     carotid bruit  . Obesity   . Gastroesophageal reflux disease   . Bipolar 1 disorder   . Tobacco abuse     Nicotine addiction  . Urticaria     Past Surgical History  Procedure  Laterality Date  . Total abdominal hysterectomy  2002  . Cholecystectomy  2004  . Cesarean section    . Colonoscopy  05/20/2012    Procedure: COLONOSCOPY;  Surgeon: Malissa Hippo, MD;  [facility];  Service: Endoscopy;  Laterality: N/A;  200    Family History  Problem Relation Age of Onset  . Heart failure Mother     History  Substance Use Topics  . Smoking status: Current Every Day Smoker -- 0.50 packs/day for 30 years    Types: Cigarettes  . Smokeless tobacco: Never Used     Comment: 6 cigarettes a day since age 7  . Alcohol Use: No    OB History   Grav Para Term Preterm Abortions TAB SAB Ect Mult Living                  Review of Systems  Constitutional: Negative for activity change.       All ROS Neg except as noted in HPI  HENT: Negative for nosebleeds and neck pain.   Eyes: Negative for photophobia and discharge.  Respiratory: Negative for cough, shortness of breath and wheezing.   Cardiovascular: Negative for chest pain and palpitations.  Gastrointestinal: Positive for nausea and abdominal pain. Negative for blood in stool.  Genitourinary: Negative for dysuria, frequency and hematuria.  Musculoskeletal: Negative for back pain and arthralgias.  Skin: Negative.   Neurological: Positive for headaches. Negative for  dizziness, seizures and speech difficulty.  Psychiatric/Behavioral: Negative for suicidal ideas, hallucinations and confusion. The patient is nervous/anxious.        Depression    Allergies  Iohexol; Vilazodone hcl; Celery oil; Gabapentin; and Methadone  Home Medications   Current Outpatient Rx  Name  Route  Sig  Dispense  Refill  . alprazolam (XANAX) 2 MG tablet   Oral   Take 2 mg by mouth 4 (four) times daily.          Marland Kitchen amLODipine (NORVASC) 10 MG tablet   Oral   Take 1 tablet (10 mg total) by mouth daily.   30 tablet   4   . citalopram (CELEXA) 20 MG tablet   Oral   Take 1 tablet (20 mg total) by mouth daily.   30  tablet   2   . dicyclomine (BENTYL) 10 MG capsule   Oral   Take 10 mg by mouth 3 (three) times daily as needed. Stomach Cramps         . hydrOXYzine (ATARAX/VISTARIL) 25 MG tablet   Oral   Take 25 mg by mouth at bedtime as needed. Anxiety         . naproxen (NAPROSYN) 500 MG tablet   Oral   Take 1 tablet (500 mg total) by mouth 2 (two) times daily with a meal.   60 tablet   2   . oxyCODONE-acetaminophen (PERCOCET) 10-325 MG per tablet   Oral   Take 1 tablet by mouth every 6 (six) hours as needed for pain.   120 tablet   0   . pravastatin (PRAVACHOL) 40 MG tablet   Oral   Take 1 tablet (40 mg total) by mouth every evening.   30 tablet   11   . ranitidine (ZANTAC) 150 MG tablet   Oral   Take 150 mg by mouth 2 (two) times daily.           BP 170/84  Pulse 104  Temp(Src) 98.4 F (36.9 C) (Oral)  Resp 18  Ht 5\' 8"  (1.727 m)  Wt 180 lb (81.647 kg)  BMI 27.38 kg/m2  SpO2 100%  Physical Exam  Nursing note and vitals reviewed. Constitutional: She appears well-developed and well-nourished.  Non-toxic appearance.  Tearful upon my arrival to the room.  HENT:  Head: Normocephalic.  Right Ear: Tympanic membrane and external ear normal.  Left Ear: Tympanic membrane and external ear normal.  Eyes: EOM and lids are normal. Pupils are equal, round, and reactive to light.  Neck: Normal range of motion. Neck supple. Carotid bruit is not present.  Cardiovascular: Normal rate, regular rhythm, normal heart sounds, intact distal pulses and normal pulses.   Pulmonary/Chest: Not tachypneic. No respiratory distress. She has rhonchi.  Course breath sound present.  Abdominal: Soft. Normal appearance and bowel sounds are normal. There is tenderness in the right upper quadrant, right lower quadrant, left upper quadrant and left lower quadrant. There is no guarding.  diffuse abd pain.   Musculoskeletal: Normal range of motion.  Lymphadenopathy:       Head (right side): No  submandibular adenopathy present.       Head (left side): No submandibular adenopathy present.    She has no cervical adenopathy.  Neurological: She is alert. She has normal strength. No cranial nerve deficit or sensory deficit. She exhibits normal muscle tone. Coordination normal.  Skin: Skin is warm and dry.  Psychiatric: Her speech is normal. Her mood appears anxious. She expresses  no suicidal plans and no homicidal plans.  Pt states she wants to go to sleep and not wake up.Marland Kitchen He denies a plan. She denies wanting to harm anyone else.    ED Course  Procedures (including critical care time)  Labs Reviewed - No data to display No results found.  PUlse ox 100% on room air. WNL by my interpretation. No diagnosis found.    MDM  I have reviewed nursing notes, vital signs, and all appropriate lab and imaging results for this patient. Pt reports some relief from pain after IV fluids and pain meds. Hepatic function and baxic metabolic panel are wnl. Lipase is 17.  CBC is non-acute. Urine drug screen is positive for benzo's, o/w negative. The acute abd series is negative for obstruction or cardiopulmonary abnormality.  Pt seen by Dr Preston Fleeting and ACT team.  Pt to be admitted to [facility].    Kathie Dike, PA-C 04/02/13 1306

## 2013-03-28 NOTE — Progress Notes (Signed)
bAdult Psychoeducational Group Note  Date:  03/28/2013 Time:  9:05 PM  Group Topic/Focus:  Rediscovering Joy:   The focus of this group is to explore various ways to relieve stress in a positive manner.  Participation Level:  Active  Participation Quality:  Appropriate, Attentive and Supportive  Affect:  Appropriate and Excited  Cognitive:  Alert, Appropriate and Oriented  Insight: Appropriate and Good  Engagement in Group:  Engaged and Supportive  Modes of Intervention:  Problem-solving, Role-play and Support  Additional Comments:  Arlys was very supportive to her peers during group. Valincia shared her personal experience with the group and listened attentively as the provided her with positive feedback.   Annell Greening [place] 03/28/2013, 9:05 PM

## 2013-03-28 NOTE — Progress Notes (Signed)
PT ACCEPTED BY NEIL MASHBURN TO DR RAVI  AT [facility]-501-1

## 2013-03-28 NOTE — ED Provider Notes (Addendum)
52 year old female comes in with complaints of headache, abdominal pain, nausea and vomiting with blood-streaked emesis and cough. It is unclear whether she is having hemoptysis or just blood-streaked emesis. She does describe a 12 pound weight loss. She is having depression and panic attacks and states that she feels like she just doesn't live anymore although she denies actual suicidal thoughts. She is noted to be emotionally labile with crying during my exam. Fundi are normal neck is supple. Lungs have coarse wheezes throughout. Heart has regular rate and rhythm. Abdomen is soft and nontender with bowel sounds decreased. Screening labs will be obtained and she'll be given albuterol with Atrovent as well as pantoprazole. She has been given fentanyl and ondansetron with no relief so she will be tried with metoclopramide and diphenhydramine. She will need evaluation by ACT Team. Of note, she is already under the care of a psychiatrist.   Date: 03/28/2013  Rate: 87  Rhythm: normal sinus rhythm  QRS Axis: normal  Intervals: normal  ST/T Wave abnormalities: normal  Conduction Disutrbances:none  Narrative Interpretation: Right atrial hypertrophy, poor R-wave progression across precordium. When compared with ECG of 07/01/2012, no significant changes are seen.  Old EKG Reviewed: unchanged  Medical screening examination/treatment/procedure(s) were conducted as a shared visit with non-physician practitioner(s) and myself.  I personally evaluated the patient during the encounter   Dione Booze, MD 03/28/13 1019  Patient has been accepted at [facility] by Dr. Daleen Bo.   Dione Booze, MD 03/28/13 1401

## 2013-03-28 NOTE — BH Assessment (Addendum)
Assessment Note   Susan [place] is an 52 y.o. female. The patient has been seen recently by Dr Judie Petit. Simpson, and they had discussed the  Increase in her symptoms of depression and the possibility of inpatient treatment.  Today she is She cries consd in the ED with complaints of not sleeping and frequent panic attacks. She is followed by Dr Omelia Blackwater in [place] and has follow up in May. She has been thinking for at least 2 weeks that she wants to go to sleep and not wake up. She She denies an active plan of suicide. She has history of previous attempts. She is not homicidal and has no history of violence.  She is neither hallucinated nor delusional. She cannot contract for safety. She reports  Having multiple panic attack daily for at least 2 weeks. She is crying constantly.  She reports that she is not sleeping at all.  She is eating but either has diarrhea or she throws up. She has lost 14 pounds in about 2 weeks. She feels tired all the time. She also complains of racing thoughts and being overwhelmed by this. She report a constant headache.  She cries constantly through the interview . Discussed with Loney Laurence NP and Dr Tonette Lederer and they feel the patient needs inpatient treatment  for her thought s of wanting to go to sleep and not wake up. Patient referred to both [facility] and [facility].  Axis I: Major Depression, Recurrent severe and Panic Disorder Axis II: Deferred Axis III:  Past Medical History  Diagnosis Date  . Chest pain, unspecified 12/2006    Normal Echo and myovue   . Dyspnea   . Nicotine addiction   . Back pain     With radiculopathy  . Depression   . Hyperlipidemia   . Hypertension   . Panic attack   . Palpitations   . Cerebrovascular disease     carotid bruit  . Obesity   . Gastroesophageal reflux disease   . Bipolar 1 disorder   . Tobacco abuse     Nicotine addiction  . Urticaria    Axis IV: economic problems and problems with access to health care  services Axis V: 31-40 impairment in reality testing  Past Medical History:  Past Medical History  Diagnosis Date  . Chest pain, unspecified 12/2006    Normal Echo and myovue   . Dyspnea   . Nicotine addiction   . Back pain     With radiculopathy  . Depression   . Hyperlipidemia   . Hypertension   . Panic attack   . Palpitations   . Cerebrovascular disease     carotid bruit  . Obesity   . Gastroesophageal reflux disease   . Bipolar 1 disorder   . Tobacco abuse     Nicotine addiction  . Urticaria     Past Surgical History  Procedure Laterality Date  . Total abdominal hysterectomy  2002  . Cholecystectomy  2004  . Cesarean section    . Colonoscopy  05/20/2012    Procedure: COLONOSCOPY;  Surgeon: Malissa Hippo, MD;  Location: [facility];  Service: Endoscopy;  Laterality: N/A;  200    Family History:  Family History  Problem Relation Age of Onset  . Heart failure Mother     Social History:  reports that she has been smoking Cigarettes.  She has a 15 pack-year smoking history. She has never used smokeless tobacco. She reports that she does  not drink alcohol or use illicit drugs.  Additional Social History:     CIWA: CIWA-Ar BP: 112/64 mmHg Pulse Rate: 80 COWS:    Allergies:  Allergies  Allergen Reactions  . Iohexol Anaphylaxis       . Vilazodone Hcl Shortness Of Breath and Itching  . Celery Oil Hives and Itching  . Gabapentin Hives, Nausea And Vomiting and Swelling  . Methadone Swelling    Home Medications:  (Not in a hospital admission)  OB/GYN Status:  No LMP recorded. Patient has had a hysterectomy.  General Assessment Data Location of Assessment: [facility] ACT Assessment: Yes Living Arrangements: Alone;Children (28 year old son-due to increased depression he is staying wi) Can pt return to current living arrangement?: Yes Admission Status: Voluntary Is patient capable of signing voluntary admission?: Yes Transfer from: Acute Hospital Referral  Source: MD  Education Status Is patient currently in school?: No  Risk to self Suicidal Ideation: Yes-Currently Present Suicidal Intent: No Is patient at risk for suicide?: Yes Suicidal Plan?: No (just wants to go to sleep and not wake up) Access to Means: No What has been your use of drugs/alcohol within the last 12 months?: denies Previous Attempts/Gestures: No How many times?: 0 Other Self Harm Risks: deoes Triggers for Past Attempts: None known Intentional Self Injurious Behavior: None Family Suicide History: No Recent stressful life event(s): Financial Problems;Recent negative physical changes Persecutory voices/beliefs?: No Depression: Yes Depression Symptoms: Despondent;Insomnia;Tearfulness;Fatigue;Loss of interest in usual pleasures;Feeling worthless/self pity Substance abuse history and/or treatment for substance abuse?: No  Risk to Others Homicidal Ideation: No Thoughts of Harm to Others: No Current Homicidal Intent: No Current Homicidal Plan: No Access to Homicidal Means: No History of harm to others?: No Assessment of Violence: None Noted Does patient have access to weapons?: No Criminal Charges Pending?: No Does patient have a court date: No  Psychosis Hallucinations: None noted Delusions: None noted  Mental Status Report Appear/Hygiene: Disheveled Eye Contact: Fair Motor Activity: Restlessness Speech: Rapid (continues to repeat that she can not sleep and her head hurt) Level of Consciousness: Alert;Restless;Crying Mood: Helpless;Sad Affect: Sad;Anxious;Depressed Anxiety Level: Moderate (has had muiltiple panic attacks over the last 2 weeks) Thought Processes: Coherent;Relevant (lots of repetition) Judgement: Unimpaired Orientation: Person;Place;Time Obsessive Compulsive Thoughts/Behaviors: Moderate  Cognitive Functioning Concentration: Decreased Memory: Recent Intact;Remote Intact IQ: Average Insight: Fair Impulse Control: Poor Appetite:  Fair Weight Loss: 14 Weight Gain: 0 Sleep: Decreased Total Hours of Sleep: 1 (she reports that she is not sleeping at all) Vegetative Symptoms: Decreased grooming  ADLScreening [facility] Assessment Services) Patient's cognitive ability adequate to safely complete daily activities?: Yes Patient able to express need for assistance with ADLs?: Yes Independently performs ADLs?: Yes (appropriate for developmental age)  Abuse/Neglect [facility]) Physical Abuse: Denies Verbal Abuse: Denies Sexual Abuse: Denies  Prior Inpatient Therapy Prior Inpatient Therapy: No  Prior Outpatient Therapy Prior Outpatient Therapy: Yes Prior Therapy Dates: current (has appointment next month) Prior Therapy Facilty/Provider(s): Dr Erasmo Downer Reason for Treatment: depression/medications  ADL Screening (condition at time of admission) Patient's cognitive ability adequate to safely complete daily activities?: Yes Patient able to express need for assistance with ADLs?: Yes Independently performs ADLs?: Yes (appropriate for developmental age)       Abuse/Neglect Assessment (Assessment to be complete while patient is alone) Physical Abuse: Denies Verbal Abuse: Denies Sexual Abuse: Denies Values / Beliefs Cultural Requests During Hospitalization: None Spiritual Requests During Hospitalization: None        Additional Information 1:1 In Past 12 Months?: No  CIRT Risk: No Elopement Risk: No Does patient have medical clearance?: Yes     Disposition: PATIENT ACCEPTED TO [facility] BY N MASHBURN TO THE SERVICE OF HJ RAVI MD TRANSPORT BY [facility] DR Preston Fleeting IN AGREEMENT WITH DISPOSITION. Disposition Initial Assessment Completed for this Encounter: Yes Disposition of Patient: Inpatient treatment program Type of inpatient treatment program: Adult  On Site Evaluation by:   Reviewed with Physician:     Jearld Pies 03/28/2013 11:51 AM
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
## 2013-03-28 NOTE — ED Notes (Signed)
Patient would like to speak with her nurse. RN made aware.

## 2013-03-28 NOTE — ED Notes (Signed)
Pt c/o ha/abd pain and depression/panic attacks. Pt states she feels that she needs to go to behavioral health because she is "tired and just needs some help". Denies SI/HI.

## 2013-03-28 NOTE — Telephone Encounter (Signed)
Spoke with patient earlier and she states she wants Dr to "put her away". She states that she doesn't feel right in the head. Didn't go into detail but I advised her to go to the Ed and tell them what was going on and if they felt the need to transport her to behavorial health then they would do so.

## 2013-03-28 NOTE — ED Notes (Signed)
Act team called for Susan [place]

## 2013-03-28 NOTE — BHH Counselor (Signed)
Ms Counce has been accepted as a voluntary admission to [facility]. N. Mashburn N.P. Accepted the patient to the service of H. Ravi MD. She is assigned to room 500-01. She will be transported by [facility]. Support paper work completed and distributed. Dr  Preston Fleeting informed of pending transfer.

## 2013-03-28 NOTE — Telephone Encounter (Signed)
Noted and agree, pt has severe depression and is treated by Dr Omelia Blackwater, I will also spk with ED Doc

## 2013-03-28 NOTE — Tx Team (Signed)
Initial Interdisciplinary Treatment Plan  PATIENT STRENGTHS: (choose at least two) Ability for insight Capable of independent living Motivation for treatment/growth  PATIENT STRESSORS: Financial difficulties Health problems   PROBLEM LIST: Problem List/Patient Goals Date to be addressed Date deferred Reason deferred Estimated date of resolution  SI 03/28/13     Depression 03/28/13                                                DISCHARGE CRITERIA:  Ability to meet basic life and health needs Improved stabilization in mood, thinking, and/or behavior Verbal commitment to aftercare and medication compliance  PRELIMINARY DISCHARGE PLAN: Attend aftercare/continuing care group Attend PHP/IOP  PATIENT/FAMIILY INVOLVEMENT: This treatment plan has been presented to and reviewed with the patient, Susan [place], and/or family member.  The patient and family have been given the opportunity to ask questions and make suggestions.  Susan [place] 03/28/2013, 5:34 PM

## 2013-03-28 NOTE — Progress Notes (Signed)
D: Patient denies SI/HI/AVH. Patient rates hopelessness as 0,  depression as 4, and anxiety as 10.  Patient affect and mood are anxious.  Pt states "I have not slept in days.  I have bad panic attacks.  I ran out of my Xanax.  That's why I am here.  I had a bad panic attack.  I felt my heart was going to explode.  I want to go to sleep so bad."  Patient did attend evening group. Patient visible on the milieu. No distress noted. A: Support and encouragement offered. Scheduled medications given to pt. Q 15 min checks continued for patient safety. R: Patient receptive. Patient remains safe on the unit.

## 2013-03-29 ENCOUNTER — Telehealth: Payer: Self-pay | Admitting: Family Medicine

## 2013-03-29 DIAGNOSIS — F332 Major depressive disorder, recurrent severe without psychotic features: Principal | ICD-10-CM

## 2013-03-29 LAB — TSH: TSH: 0.526 u[IU]/mL (ref 0.350–4.500)

## 2013-03-29 MED ORDER — SERTRALINE HCL 25 MG PO TABS
25.0000 mg | ORAL_TABLET | Freq: Every day | ORAL | Status: DC
  Administered 2013-03-29 – 2013-03-30 (×2): 25 mg via ORAL
  Filled 2013-03-29 (×4): qty 1

## 2013-03-29 MED ORDER — TRAZODONE HCL 100 MG PO TABS
100.0000 mg | ORAL_TABLET | Freq: Every evening | ORAL | Status: DC | PRN
  Administered 2013-03-29 – 2013-03-30 (×4): 100 mg via ORAL
  Filled 2013-03-29 (×8): qty 1
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
## 2013-03-29 NOTE — H&P (Signed)
Behavioral Health Medical Screening Exam  Susan [place] is an 52 y.o. female.  Review of Systems  Constitutional: Negative.   HENT: Negative.   Eyes: Negative.   Respiratory: Negative.   Cardiovascular: Negative.   Gastrointestinal: Negative.   Genitourinary: Negative.   Musculoskeletal: Negative.   Skin: Negative.   Neurological: Negative.   Endo/Heme/Allergies: Negative.   Psychiatric/Behavioral: Positive for depression. The patient is nervous/anxious.     Physical Exam  Constitutional: She is oriented to person, place, and time. She appears well-developed and well-nourished.  HENT:  Head: Normocephalic and atraumatic.  Right Ear: External ear normal.  Left Ear: External ear normal.  Nose: Nose normal.  Mouth/Throat: Oropharynx is clear and moist.  Eyes: Conjunctivae and EOM are normal.  Neck: Normal range of motion. Neck supple.  Cardiovascular: Normal rate, regular rhythm, normal heart sounds and intact distal pulses.   Respiratory: Effort normal and breath sounds normal.  GI: Soft. Bowel sounds are normal.  Genitourinary:  Deferred, no complaints  Musculoskeletal: Normal range of motion.  Neurological: She is alert and oriented to person, place, and time. She has normal reflexes.  Skin: Skin is warm and dry.    Blood pressure 134/76, pulse 106, temperature 98.7 F (37.1 C), temperature source Oral, resp. rate 20, height 5\' 8"  (1.727 m), weight 81.647 kg (180 lb).  Recommendations:  Based on my evaluation the patient does not appear to have an emergency medical condition.  LORD, JAMISON 03/29/2013, 3:00 PM

## 2013-03-29 NOTE — Progress Notes (Signed)
Adult Psychoeducational Group Note  Date:  03/29/2013 Time:  10:00 PM  Group Topic/Focus:  Wrap-Up Group:   The focus of this group is to help patients review their daily goal of treatment and discuss progress on daily workbooks.  Participation Level:  Active  Participation Quality:  Appropriate  Affect:  Appropriate  Cognitive:  Appropriate  Insight: Improving  Engagement in Group:  Engaged  Modes of Intervention:  Discussion and Problem-solving  Additional Comments:  Patient expressed that she enjoyed going to the gym where she laughed, talked, and listened to peers and staff. Patient stated that her goal was to smile and laugh more today. Patient plans to attend five groups tomorrow.   Rodman Pickle R 03/29/2013, 10:00 PM

## 2013-03-29 NOTE — Progress Notes (Signed)
Writer observed patient sitting in the dayroom watching tv. Writer spoke with patient 1:1 and she reports having had a good day and really enjoyed going to the gym today and she plans to smile and laugh more. Patient reports her goal for tomorrow is to attend all her groups on tomorrow depending on how she rest tonight. Patient reports that she slept poorly last night. Patient reports her pain at a 14 and she was offered tylenol and heat packs but reused both. Patients reported pain at 14 but writer has observed her up and active on the unit no signs of severe pain or distress noted.  Patient currently denies si/hi/a/v hallucinations. Safety maintained on unit with 15 min checks, will continue to monitor.

## 2013-03-29 NOTE — Telephone Encounter (Signed)
Medication will be prescribed when she is out of the hospital, let her know I an thankful she is in the hospital getting the help that she needs. She needs to believe that she will improve on medication

## 2013-03-29 NOTE — [facility] Group Notes (Signed)
[facility] LCSW Aftercare Discharge Planning Group Note   03/29/2013 11:38 AM  Participation Quality:  Guarded  Mood/Affect:  Depressed and Flat  Depression Rating:  1  Anxiety Rating:  1o  Thoughts of Suicide:  No  Will you contract for safety?   NA  Current AVH:  No  Plan for Discharge/Comments:  Patient reports she admitted to hospital due to being sick.  She was guarded in regard to what was occuring emotionally/mentally that would have led to her being admitted to this facility.  She reports having have home, transportation, access to meds and outpatient provided. Writer to meet with patient individually to obtain more information.  Transportation Means: Patient has transportation.   Supports:  Patient has a good support system.   Donnell Wion, Joesph July

## 2013-03-29 NOTE — Telephone Encounter (Signed)
Noted  
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
## 2013-03-29 NOTE — [facility] Suicide Risk Assessment (Signed)
[facility] INPATIENT:  Family/Significant Other Suicide Prevention Education  Suicide Prevention Education:  Patient Refusal for Family/Significant Other Suicide Prevention Education: The patient Susan Walker has refused to provide written consent for family/significant other to be provided Family/Significant Other Suicide Prevention Education during admission and/or prior to discharge.  Physician notified.  Patient advised of not having family to involve in treatment.  Wynn Banker 03/29/2013, 3:14 PM

## 2013-03-29 NOTE — Progress Notes (Signed)
Psychoeducational Group Note  Date:  03/29/2013 Time:  1100   Group Topic/Focus:  Therapeutic Activity- "Apples to Apples"  Participation Level: Did Not Attend  Participation Quality:  Not Applicable  Affect:  Not Applicable  Cognitive:  Not Applicable  Insight:  Not Applicable  Engagement in Group: Not Applicable  Additional Comments:  Pt did not attend group due to some medical issues.   Sharyn Lull 03/29/2013, 11:47 AM

## 2013-03-29 NOTE — [facility] Group Notes (Signed)
[facility] LCSW Group Therapy      Feelings About Diagnosis 1:15 - 2:30 PM          03/29/2013 12:37 PM  Type of Therapy:  Group Therapy  Participation Level:  Did Not Attend  Wynn Banker 03/29/2013, 12:37 PM

## 2013-03-29 NOTE — Telephone Encounter (Signed)
fyi

## 2013-03-29 NOTE — Progress Notes (Signed)
Grief and Loss Group ° °Group members discussed significant losses in their life and their feelings associated with their losses. Members shared coping strategies that helped them through their grief and loss process.  ° °Pt did not verbally participate in group, and was present and attentive during group.  ° °Isabelle Ong °Counselor Intern °[employer] °
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
## 2013-03-29 NOTE — Progress Notes (Signed)
Pt anxious throughout the night.  Pt did not sleep.  She states that she need Fentanyl to help ease her pain and Xanax for her anxiety.  Librium was given; however she reports that it made her feel like she was going to have a stroke.  Pt states that she should not have come here if she was not going to get the medications that she needs.  Pt requested to have her BP medication given early.  BP was slightly elevated.  Medication given.

## 2013-03-29 NOTE — [facility] Suicide Risk Assessment (Signed)
Suicide Risk Assessment  Admission Assessment     Nursing information obtained from:  Patient Demographic factors:  Low socioeconomic status;Unemployed Current Mental Status:  Self-harm thoughts Loss Factors:  Decline in physical health;Financial problems / change in socioeconomic status Historical Factors:  NA Risk Reduction Factors:  Responsible for children under 52 years of age;Living with another person, especially a relative;Positive social support  CLINICAL FACTORS:   Alcohol/Substance Abuse/Dependencies  COGNITIVE FEATURES THAT CONTRIBUTE TO RISK:  Thought constriction (tunnel vision)    SUICIDE RISK:   Minimal: No identifiable suicidal ideation.  Patients presenting with no risk factors but with morbid ruminations; may be classified as minimal risk based on the severity of the depressive symptoms  PLAN OF CARE: Initiate detox protocol. Adjust medications as needed. Provide supportive counselling and education.  I certify that inpatient services furnished can reasonably be expected to improve the patient's condition.  Bri Wakeman 03/29/2013, 10:28 AM

## 2013-03-30 DIAGNOSIS — F1910 Other psychoactive substance abuse, uncomplicated: Secondary | ICD-10-CM

## 2013-03-30 DIAGNOSIS — F1994 Other psychoactive substance use, unspecified with psychoactive substance-induced mood disorder: Secondary | ICD-10-CM

## 2013-03-30 DIAGNOSIS — F411 Generalized anxiety disorder: Secondary | ICD-10-CM

## 2013-03-30 MED ORDER — LIDOCAINE 5 % EX PTCH
1.0000 | MEDICATED_PATCH | CUTANEOUS | Status: DC
  Filled 2013-03-30: qty 1

## 2013-03-30 MED ORDER — NABUMETONE 500 MG PO TABS
500.0000 mg | ORAL_TABLET | Freq: Two times a day (BID) | ORAL | Status: DC
  Administered 2013-03-30 – 2013-03-31 (×2): 500 mg via ORAL
  Filled 2013-03-30 (×6): qty 1

## 2013-03-30 MED ORDER — LIDOCAINE 5 % EX PTCH
1.0000 | MEDICATED_PATCH | Freq: Once | CUTANEOUS | Status: AC
  Administered 2013-03-30: 1 via TRANSDERMAL
  Filled 2013-03-30: qty 1

## 2013-03-30 MED ORDER — HYDROXYZINE HCL 25 MG PO TABS
25.0000 mg | ORAL_TABLET | Freq: Four times a day (QID) | ORAL | Status: DC | PRN
  Administered 2013-03-30 – 2013-03-31 (×3): 25 mg via ORAL

## 2013-03-30 MED ORDER — SERTRALINE HCL 50 MG PO TABS
50.0000 mg | ORAL_TABLET | Freq: Every day | ORAL | Status: DC
  Administered 2013-03-31: 50 mg via ORAL
  Filled 2013-03-30 (×4): qty 1

## 2013-03-30 MED ORDER — METHOCARBAMOL 500 MG PO TABS
500.0000 mg | ORAL_TABLET | Freq: Three times a day (TID) | ORAL | Status: DC
  Administered 2013-03-30 – 2013-03-31 (×4): 500 mg via ORAL
  Filled 2013-03-30 (×11): qty 1

## 2013-03-30 MED ORDER — LIDOCAINE 5 % EX PTCH
1.0000 | MEDICATED_PATCH | CUTANEOUS | Status: DC
  Administered 2013-03-31: 1 via TRANSDERMAL
  Filled 2013-03-30 (×3): qty 1

## 2013-03-30 NOTE — Progress Notes (Signed)
Patient ID: Susan Walker, female   DOB: [birth year], 52 y.o.   MRN: [id number] D: Patient presented with depressed mood and flat affect. Pt denies SI/HI/AVH and pain. Pt attended evening wrap up group and Interacted appropriately with peers. Pt stated in group she is feeling better and happy to get along with everyone on the hall.  Pt denies any needs or concerns.  Cooperative with assessment. No acute distressed noted at this time.   A: Met with pt 1:1. Medications administered as prescribed. Writer encouraged pt to discuss feelings. Pt encouraged to come to staff with any question or concerns. 15 minutes checks for safety.  R: Patient remains safe. She is complaint with medications and denies any adverse reaction. Continue current POC.

## 2013-03-30 NOTE — Progress Notes (Signed)
Nutrition Brief Note  Patient identified on the Malnutrition Screening Tool (MST) Report  Body mass index is 27.38 kg/(m^2). Patient meets criteria for overwieght based on current BMI.   Current diet order is regular, patient is consuming approximately very good% of meals at this time. Labs and medications reviewed.   Patient admitted with major depression.  Reports weight loss of 12 lbs over the last 2 weeks.  Current weight =180 lbs.  Ht:  5'51/2"  Spoke with patient  Who reports weight loss secondary to decreased appetite and decreased portions.  Saw during lunch and intake is now very good.  Encouraged portion control and healthy eating for appropriate slow weigh loss/contol.  No nutrition interventions warranted at this time. If nutrition issues arise, please consult RD.   Oran Rein, RD, LDN Clinical Inpatient Dietitian Pager:  [phone] Weekend and after hours pager:  [phone]

## 2013-03-30 NOTE — Progress Notes (Signed)
Recreation Therapy Notes  Date: [id number] Time: 3:05pm Location: [address] Day Room      Group Topic/Focus: Goal Setting  Participation Level: Active  Participation Quality: Appropriate and Attentive  Affect: Euthymic  Cognitive: Appropriate  Additional Comments: Activity: Chain Link ; Explanation: Patients were divided into team of two or three. Holding one hand behind back patients were asked to work together to link paper clips together. Patients were given a time limit and asked to set a goal for the number of paper clips they could link together.   Patient arrived to group approximately 5 minutes late. Patient with two peers set a goal of linking 15 paper clips together. Patient with peers were unsuccessful at this goal, patient team was only able to link 9 paper clips together. Patient actively participated in wrap up discussion about the skills necessary to be successful at this activity. Patient was unable to state what she learned from group session. Patient stated her mind went to things outside of the hospital and she wanted to think about how she could use the activity post discharge.   Marykay Lex Mourad Cwikla, LRT/CTRS  Jearl Klinefelter 03/30/2013 4:34 PM

## 2013-03-30 NOTE — Progress Notes (Signed)
Adult Psychoeducational Group Note  Date:  03/30/2013 Time:  11:59 AM  Group Topic/Focus:  Identifying Needs:   The focus of this group is to help patients identify their personal needs that have been historically problematic and identify healthy behaviors to address their needs.  Participation Level:  Active  Participation Quality:  Attentive  Affect:  Appropriate  Cognitive:  Oriented  Insight: Good  Engagement in Group:  Engaged  Modes of Intervention:  Discussion and Education  Additional Comments:  Pts goal is to go to group  Ginnifer Creelman T 03/30/2013, 11:59 AM

## 2013-03-30 NOTE — [facility] Group Notes (Signed)
[facility] LCSW Group Therapy      Emotional Regulation 1:15 - 2:30 PM     03/30/2013 3:10 PM  Type of Therapy:  Group Therapy  Participation Level:  Active  Participation Quality:  Appropriate  Affect:  Appropriate  Cognitive:  Appropriate  Insight:  Engaged  Engagement in Therapy:  Engaged  Modes of Intervention:  Discussion, Education, Exploration, Problem-Solving, Rapport Building, Support  Summary of Progress/Problems:  Patient reports having problems controlling her emotions when she is around negative people.  She shared her family tends to be very negative and have made remarks about her admitting to a psychiatric unit.  Patient shared she needed help and is glad that she found what she needed.   Wynn Banker 03/30/2013, 3:10 PM

## 2013-03-30 NOTE — Progress Notes (Signed)
Patient ID: Susan Walker, female   DOB: 03/18/1961, 52 y.o.   MRN: [id number]  PER STATE REGULATIONS 482.30  THIS CHART WAS REVIEWED FOR MEDICAL NECESSITY WITH RESPECT TO THE PATIENT'S ADMISSION/ DURATION OF STAY.  NEXT REVIEW DATE: 04/02/2013  Willa Rough, RN, BSN CASE MANAGER

## 2013-03-30 NOTE — [facility] Group Notes (Signed)
[facility] LCSW Aftercare Discharge Planning Group Note   03/30/2013 12:35 PM  Participation Quality:  Appropriate  Mood/Affect:  Depressed  Depression Rating:  0  Anxiety Rating:  0  Thoughts of Suicide: No  Will you contract for safety?  N/A  Current AVH:  No  Plan for Discharge/Comments:  Patient reports doing well and feeling great and hopes to discharge home today or tomorrow.   Transportation Means: Patient has transportation.  Supports:  Patient has a good support system.   Corrion Stirewalt, Joesph July

## 2013-03-30 NOTE — Progress Notes (Signed)
Pt has been up and participating in the milieu today.She reported her sleep as fair stating,"I slept until 0300 and could not get back to sleep". The check reflects 5 hours of sleep.  Discussed with her to raise her hand so staff would know she is really awake. She voiced understanding.  She rated her depression a 2 hopelessness a 1 and her anxiety a 10 in the morning then she calms down as the day progress.  She denies any S/H ideation or A/V hallucinations.  She plans to f/u with Dr. Lodema Hong and Dr. Fayette Pho?  Possible discharge tomorrow.

## 2013-03-30 NOTE — Plan of Care (Signed)
Problem: Alteration in mood Goal: LTG-Pt's behavior demonstrates decreased signs of depression Patient is attending group and rates depression at one.   Horace Porteous Merrilyn Legler, LCSW 03/29/2013 Outcome: Completed/Met Date Met:  03/30/13 Patient is attending groups and discussing concerns.  She is talking about concerns regarding prescription medications and dependence.  Horace Porteous Taelynn Mcelhannon, LCSW 03/30/2013

## 2013-03-30 NOTE — Progress Notes (Signed)
[facility] MD Progress Note  03/30/2013 12:32 PM Susan Walker  MRN:  [id number] Subjective:   Patient left group to talk with this writer and stated, "I feel wonderful." but as soon as we sat down to talk, she complained of 14/10 back pain.  When discussing what has worked for her in the past, she mentioned oxycontin, percocet, and xanax but "over did it".  Currently, detoxing off of these medications--non-narcotic medications in place for her pain and vistaril for her anxiety.  Other methods discussed to decrease her nighttime anxiety:  Deep breathing, alternate nare breathing,   Diagnosis:   Axis I: Anxiety Disorder NOS, Substance Abuse and Substance Induced Mood Disorder Axis II: Deferred Axis III:  Past Medical History  Diagnosis Date  . Chest pain, unspecified 12/2006    Normal Echo and myovue   . Dyspnea   . Nicotine addiction   . Back pain     With radiculopathy  . Depression   . Hyperlipidemia   . Hypertension   . Panic attack   . Palpitations   . Cerebrovascular disease     carotid bruit  . Obesity   . Gastroesophageal reflux disease   . Bipolar 1 disorder   . Tobacco abuse     Nicotine addiction  . Urticaria    Axis IV: other psychosocial or environmental problems, problems related to social environment and problems with primary support group Axis V: 41-50 serious symptoms  ADL's:  Intact  Sleep: Fair  Appetite:  Good  Suicidal Ideation:  Denies Homicidal Ideation:  Denies  Psychiatric Specialty Exam: Review of Systems  Constitutional: Negative.   HENT: Negative.   Eyes: Negative.   Respiratory: Negative.   Cardiovascular: Negative.   Gastrointestinal: Negative.   Genitourinary: Negative.   Musculoskeletal: Positive for back pain.  Skin: Negative.   Neurological: Negative.   Endo/Heme/Allergies: Negative.   Psychiatric/Behavioral: Positive for depression and substance abuse. The patient is nervous/anxious.     Blood pressure 117/76, pulse 91,  temperature 98.2 F (36.8 C), temperature source Oral, resp. rate 20, height 5\' 8"  (1.727 m), weight 81.647 kg (180 lb).Body mass index is 27.38 kg/(m^2).  General Appearance: Casual  Eye Contact::  Good  Speech:  Normal Rate  Volume:  Normal  Mood:  Anxious and Depressed  Affect:  Congruent  Thought Process:  Coherent  Orientation:  Full (Time, Place, and Person)  Thought Content:  WDL  Suicidal Thoughts:  No  Homicidal Thoughts:  No  Memory:  Immediate;   Fair Recent;   Fair Remote;   Fair  Judgement:  Fair  Insight:  Fair  Psychomotor Activity:  Decreased  Concentration:  Fair  Recall:  Fair  Akathisia:  No  Handed:  Right  AIMS (if indicated):     Assets:  Resilience  Sleep:  Number of Hours: 5   Current Medications: Current Facility-Administered Medications  Medication Dose Route Frequency Provider Last Rate Last Dose  . acetaminophen (TYLENOL) tablet 650 mg  650 mg Oral Q6H PRN Verne Spurr, PA-C   650 mg at 03/29/13 2127  . alum & mag hydroxide-simeth (MAALOX/MYLANTA) 200-200-20 MG/5ML suspension 30 mL  30 mL Oral Q4H PRN Verne Spurr, PA-C   30 mL at 03/30/13 0806  . amLODipine (NORVASC) tablet 10 mg  10 mg Oral Daily Rachael Fee, MD   10 mg at 03/30/13 0804  . chlordiazePOXIDE (LIBRIUM) capsule 25 mg  25 mg Oral Q6H PRN Rachael Fee, MD   25 mg at  03/30/13 0403  . dicyclomine (BENTYL) capsule 10 mg  10 mg Oral TID PRN Rachael Fee, MD      . hydrOXYzine (ATARAX/VISTARIL) tablet 25 mg  25 mg Oral QHS PRN Rachael Fee, MD   25 mg at 03/29/13 2127  . lidocaine (LIDODERM) 5 % 1 patch  1 patch Transdermal Q24H Nanine Means, NP      . magnesium hydroxide (MILK OF MAGNESIA) suspension 30 mL  30 mL Oral Daily PRN Verne Spurr, PA-C      . methocarbamol (ROBAXIN) tablet 500 mg  500 mg Oral TID Nanine Means, NP      . nabumetone (RELAFEN) tablet 500 mg  500 mg Oral BID Nanine Means, NP      . nicotine (NICODERM CQ - dosed in mg/24 hours) patch 14 mg  14 mg Transdermal  Daily Adriel Kessen, MD   14 mg at 03/30/13 0545  . sertraline (ZOLOFT) tablet 25 mg  25 mg Oral Daily Burwell Bethel, MD   25 mg at 03/30/13 0808  . simvastatin (ZOCOR) tablet 40 mg  40 mg Oral q1800 Rachael Fee, MD   40 mg at 03/29/13 1712  . traZODone (DESYREL) tablet 100 mg  100 mg Oral QHS,MR X 1 Janashia Parco, MD   100 mg at 03/29/13 2237    Lab Results:  Results for orders placed during the hospital encounter of 03/28/13 (from the past 48 hour(s))  TSH     Status: None   Collection Time    03/28/13  8:25 PM      Result Value Range   TSH 0.526  0.350 - 4.500 uIU/mL    Physical Findings: AIMS: Facial and Oral Movements Muscles of Facial Expression: None, normal Lips and Perioral Area: None, normal Jaw: None, normal Tongue: None, normal,Extremity Movements Upper (arms, wrists, hands, fingers): None, normal Lower (legs, knees, ankles, toes): None, normal, Trunk Movements Neck, shoulders, hips: None, normal, Overall Severity Severity of abnormal movements (highest score from questions above): None, normal Incapacitation due to abnormal movements: None, normal Patient's awareness of abnormal movements (rate only patient's report): No Awareness, Dental Status Current problems with teeth and/or dentures?: No Does patient usually wear dentures?: Yes  CIWA:    COWS:     Treatment Plan Summary: Daily contact with patient to assess and evaluate symptoms and progress in treatment Medication management  Plan:  Review of chart, vital signs, medications, and notes. 1-Individual and group therapy 2-Medication management for depression and anxiety:  Medications reviewed with the patient and Lidoderm patch to back pain, Relafen, and Robaxin ordered for her back pain. 3-Coping skills for depression, anxiety, and pain 4-Continue crisis stabilization and management 5-Address health issues--monitoring vital signs, stable 6-Treatment plan in progress to prevent relapse of depression and  anxiety  Medical Decision Making Problem Points:  Established problem, stable/improving (1) and Review of psycho-social stressors (1) Data Points:  Review of new medications or change in dosage (2)  I certify that inpatient services furnished can reasonably be expected to improve the patient's condition.   Nanine Means, PMH-NP 03/30/2013, 12:32 PM

## 2013-03-30 NOTE — Tx Team (Signed)
Interdisciplinary Treatment Plan Update   Date Reviewed:  03/30/2013  Time Reviewed:  10:25 AM  Progress in Treatment:   Attending groups: Yes Participating in groups: Yes Taking medication as prescribed: Yes  Tolerating medication: Yes Family/Significant other contact made: No, patient advised of no family to involve in treatment Patient understands diagnosis: Yes  Discussing patient identified problems/goals with staff: Yes Medical problems stabilized or resolved: Yes Denies suicidal/homicidal ideation: Yes Patient has not harmed self or others: Yes  For review of initial/current patient goals, please see plan of care.  Estimated Length of Stay:  2-3 days  Reasons for Continued Hospitalization:   Medication stabilization  New Problems/Goals identified:    Discharge Plan or Barriers:   Home with outpatient follow up  Additional Comments:  Patient reports she is doing well and denies any withdrawal symptoms.  She is hopeful to discharge home soon.  Attendees:  Patient:  03/30/2013 10:25 AM   Signature: Patrick North, MD 03/30/2013 10:25 AM  Signature:  Robbie Louis, RN 03/30/2013 10:25 AM  Signature: Harold Barban, RN 03/30/2013 10:25 AM  Signature: 03/30/2013 10:25 AM  Signature:   03/30/2013 10:25 AM  Signature:  Juline Patch, LCSW 03/30/2013 10:25 AM  Signature: Silverio Decamp, PMH-NP 03/30/2013 10:25 AM  Signature:  Jerl Santos 03/30/2013 10:25 AM  Signature: Maseta Dorley,Care Coordinator 03/30/2013 10:25 AM  Signature:    Signature:    Signature:      Scribe for Treatment Team:   Juline Patch,  03/30/2013 10:25 AM

## 2013-03-30 NOTE — Progress Notes (Signed)
Adult Psychoeducational Group Note  Date:  03/30/2013 Time:  10:59 AM  Group Topic/Focus:  Therapeutic Activity- Our Perception "Apples to Apples"  Participation Level:  Active  Participation Quality:  Appropriate, Attentive and Sharing  Affect:  Appropriate  Cognitive:  Alert and Appropriate  Insight: Appropriate  Engagement in Group:  Engaged  Modes of Intervention:  Activity  Additional Comments:   Pt was appropriate and sharing while attending group. Pt participated in a therapeutic activity that highlighted every patient's perception. Pt appeared pleasant and excited about the interactions that took place during this activity.   Sharyn Lull 03/30/2013, 10:59 AM

## 2013-03-31 DIAGNOSIS — F329 Major depressive disorder, single episode, unspecified: Secondary | ICD-10-CM

## 2013-03-31 DIAGNOSIS — F410 Panic disorder [episodic paroxysmal anxiety] without agoraphobia: Secondary | ICD-10-CM

## 2013-03-31 MED ORDER — PRAVASTATIN SODIUM 40 MG PO TABS: 40.0000 mg | ORAL_TABLET | Freq: Every evening | ORAL | Status: DC

## 2013-03-31 MED ORDER — METHOCARBAMOL 500 MG PO TABS: 500.0000 mg | ORAL_TABLET | Freq: Three times a day (TID) | ORAL | Status: DC

## 2013-03-31 MED ORDER — HYDROXYZINE HCL 25 MG PO TABS: 25.0000 mg | ORAL_TABLET | Freq: Four times a day (QID) | ORAL | Status: DC | PRN

## 2013-03-31 MED ORDER — AMLODIPINE BESYLATE 10 MG PO TABS: 10.0000 mg | ORAL_TABLET | Freq: Every day | ORAL | Status: DC

## 2013-03-31 MED ORDER — SERTRALINE HCL 50 MG PO TABS: 50.0000 mg | ORAL_TABLET | Freq: Every day | ORAL | Status: DC

## 2013-03-31 MED ORDER — TRAZODONE HCL 100 MG PO TABS: 100.0000 mg | ORAL_TABLET | Freq: Every evening | ORAL | Status: DC | PRN

## 2013-03-31 MED ORDER — NABUMETONE 500 MG PO TABS: 500.0000 mg | ORAL_TABLET | Freq: Two times a day (BID) | ORAL | Status: DC

## 2013-03-31 NOTE — Progress Notes (Signed)
[facility] Adult Case Management Discharge Plan :  Will you be returning to the same living situation after discharge: Yes,  home with family At discharge, do you have transportation home?:Yes,  family/friends can come and pick patient up Do you have the ability to pay for your medications:Yes,  no barriers  Release of information consent forms completed and in the chart;  Patient's signature needed at discharge.  Patient to Follow up at: Follow-up Information   Follow up with Florencia Reasons - [facility] On 04/05/2013. (Tuesday, May 6, 10 AM to complete registration or you may go by [facility] to obtain forms prior to your appointment.)    Contact information:   [address]  [place], [place]  [address]  [phone]      Follow up with Dr. Omelia Blackwater.      Patient denies SI/HI:   Yes,  no reports of SI    Safety Planning and Suicide Prevention discussed:  Yes,  completed with patient as she refused to share with family  Nail, Catalina Gravel 03/31/2013, 9:50 AM

## 2013-03-31 NOTE — [facility] Group Notes (Signed)
[facility] LCSW Aftercare Discharge Planning Group Note   03/31/2013 9:21 AM  Participation Quality:  Patient came in late, but did participate in discussion  Mood/Affect:  Blunted  Depression Rating:  0  Anxiety Rating:  4  (due to multiple things to do when she gets home)  Thoughts of Suicide:  No Will you contract for safety?   Yes  Current AVH:  NA  Plan for Discharge/Comments:  Patient is wanting to leave today to go back home to her 52 year old son who has special needs.  Patient reports she feels she is back to normal, medications are working well and she has a doctor's appointment on Monday at 8:30am.  Patient has follow up in place with her long term doctors per her report.  Transportation Means: patient has means to get home  Supports: Reports not many supports, however does have family and peers that are positive for her daily.  Nail, Catalina Gravel

## 2013-03-31 NOTE — Progress Notes (Signed)
Patient ID: Susan Walker, female   DOB: 02-May-1961, 52 y.o.   MRN: [id number] Patient discharged home per MD order.  Medications and discharge instructions reviewed with patient.  She denies any SI/HI/AVH.  She understood her follow up instructions.  Patient received all her personal belongings.  She left ambulatory with a friend.

## 2013-03-31 NOTE — [facility] Suicide Risk Assessment (Signed)
Suicide Risk Assessment  Discharge Assessment     Demographic Factors:  Female, african american  Mental Status Per Nursing Assessment::   On Admission:  Self-harm thoughts  Current Mental Status by Physician: Patient alert and oriented to 4. Denies aH/VH/Si/HI.  Loss Factors: Decline in physical health  Historical Factors: Impulsivity  Risk Reduction Factors:   Sense of responsibility to family, Positive social support and Positive coping skills or problem solving skills  Continued Clinical Symptoms:  Depression:   Recent sense of peace/wellbeing  Cognitive Features That Contribute To Risk:  Cognitively intact   Suicide Risk:  Minimal: No identifiable suicidal ideation.  Patients presenting with no risk factors but with morbid ruminations; may be classified as minimal risk based on the severity of the depressive symptoms  Discharge Diagnoses:   AXIS I:  Major Depression, Recurrent severe AXIS II:  Deferred AXIS III:   Past Medical History  Diagnosis Date  . Chest pain, unspecified 12/2006    Normal Echo and myovue   . Dyspnea   . Nicotine addiction   . Back pain     With radiculopathy  . Depression   . Hyperlipidemia   . Hypertension   . Panic attack   . Palpitations   . Cerebrovascular disease     carotid bruit  . Obesity   . Gastroesophageal reflux disease   . Bipolar 1 disorder   . Tobacco abuse     Nicotine addiction  . Urticaria    AXIS IV:  other psychosocial or environmental problems AXIS V:  61-70 mild symptoms  Plan Of Care/Follow-up recommendations:  Activity:  As tolerated Diet:  Low salt, low fat diet Follow up with outpatient appointments.  Is patient on multiple antipsychotic therapies at discharge:  No   Has Patient had three or more failed trials of antipsychotic monotherapy by history:  No  Recommended Plan for Multiple Antipsychotic Therapies: NA  Santino Kinsella 03/31/2013, 10:10 AM

## 2013-04-01 NOTE — Discharge Summary (Signed)
Physician Discharge Summary Note  Patient:  Susan Walker is an 52 y.o., female MRN:  [id number] DOB:  12/24/1960 Patient phone:  [phone] (home)  Patient address:   [address] [place] [address],   Date of Admission:  03/28/2013 Date of Discharge: 03/31/2013  Reason for Admission:  Depression with suicidal ideations  Discharge Diagnoses: Principal Problem:   DEPRESSION Active Problems:   Panic disorder  ROS Axis Diagnosis:   AXIS I:  Anxiety Disorder NOS, Major Depression, Recurrent severe and Panic Disorder AXIS II:  Deferred AXIS III:   Past Medical History  Diagnosis Date  . Chest pain, unspecified 12/2006    Normal Echo and myovue   . Dyspnea   . Nicotine addiction   . Back pain     With radiculopathy  . Depression   . Hyperlipidemia   . Hypertension   . Panic attack   . Palpitations   . Cerebrovascular disease     carotid bruit  . Obesity   . Gastroesophageal reflux disease   . Bipolar 1 disorder   . Tobacco abuse     Nicotine addiction  . Urticaria    AXIS IV:  other psychosocial or environmental problems, problems related to social environment and problems with primary support group AXIS V:  61-70 mild symptoms  Level of Care:  OP  Hospital Course:  On admission:  Kittie Plater presented to the ED with complaints of not feeling well, losing 12 lbs in 2 weeks, frequent panic attacks and suicidal thoughts.The patient has been seen recently by Dr Judie Petit. Simpson, and they had discussed the Increase in her symptoms of depression and the possibility of inpatient treatment. She is followed by Dr Omelia Blackwater in [place] and has follow up in May. Per [facility] assessment patient endorsed in ED that she has been thinking for at least 2 weeks that she wants to go to sleep and not wake up. Patient denied any SI this morning. She denies an active plan of suicide. She has history of previous attempts. She is not homicidal and has no history of violence. Patient reports having  multiple panic attack daily for at least 2 weeks. Reports increased crying , poor sleep, poor appetite. She is eating but either has diarrhea or she throws up. She feels tired all the time. She also complains of racing thoughts and being overwhelmed by this. Patient denied use of alcohol or illicit drugs. She endorsed taking about 4-5 percocet tablets daily for scoliosis, realizes she has become dependent on the pain medication. This morning patient complaining of feeling tired, having diarrhea (twice so far) and has been lying in bed.  During hospitalization:  Medication managed--her Norvasc 10 mg daily for HTN, simvastatin 40 mg for hyperlipidemia, and Vistaril 25 mg for anxiety continued from her home medication list.  Robaxin 500 mg TID and Relafen 500 mg BID started for her back pain issues.  Zoloft 50 mg for depression started and Trazodone 100 mg for her sleep given.  Preeti has an appointment to see her regular care provider for her back pain on Monday--stated she wanted to stay off of the narcotic medications.  She attended and participated in group therapy, positive attitude, and developed coping strategies to assist her with her depression and stressors at home.  Patient denied suicidal/homicidal ideations and auditory/visual hallucinations, follow-up appointments encouraged to attend, and Rx given at discharge.  Sharisse is mentally and physically stable for discharge.  Consults:  None  Significant Diagnostic Studies:  labs: Completed  and reviewed, stable  Discharge Vitals:   Blood pressure 110/72, pulse 91, temperature 98.2 F (36.8 C), temperature source Oral, resp. rate 20, height 5\' 8"  (1.727 m), weight 81.647 kg (180 lb). Body mass index is 27.38 kg/(m^2). Lab Results:   No results found for this or any previous visit (from the past 72 hour(s)).  Physical Findings: AIMS: Facial and Oral Movements Muscles of Facial Expression: None, normal Lips and Perioral Area: None, normal Jaw:  None, normal Tongue: None, normal,Extremity Movements Upper (arms, wrists, hands, fingers): None, normal Lower (legs, knees, ankles, toes): None, normal, Trunk Movements Neck, shoulders, hips: None, normal, Overall Severity Severity of abnormal movements (highest score from questions above): None, normal Incapacitation due to abnormal movements: None, normal Patient's awareness of abnormal movements (rate only patient's report): No Awareness, Dental Status Current problems with teeth and/or dentures?: No Does patient usually wear dentures?: Yes  CIWA:    COWS:     Psychiatric Specialty Exam: See Psychiatric Specialty Exam and Suicide Risk Assessment completed by Attending Physician prior to discharge.  Discharge destination:  Home  Is patient on multiple antipsychotic therapies at discharge:  No   Has Patient had three or more failed trials of antipsychotic monotherapy by history:  No Recommended Plan for Multiple Antipsychotic Therapies:  N/A  Discharge Orders   Future Appointments Provider Department Dept Phone   04/04/2013 8:45 AM Kerri Perches, MD [facility] [phone]   Future Orders Complete By Expires     Activity as tolerated - No restrictions  As directed     Diet - low sodium heart healthy  As directed         Medication List    STOP taking these medications       alprazolam 2 MG tablet  Commonly known as:  XANAX     dicyclomine 10 MG capsule  Commonly known as:  BENTYL     naproxen 500 MG tablet  Commonly known as:  NAPROSYN      TAKE these medications     Indication   amLODipine 10 MG tablet  Commonly known as:  NORVASC  Take 1 tablet (10 mg total) by mouth daily.   Indication:  High Blood Pressure     hydrOXYzine 25 MG tablet  Commonly known as:  ATARAX/VISTARIL  Take 1 tablet (25 mg total) by mouth 4 (four) times daily as needed for itching or anxiety.      methocarbamol 500 MG tablet  Commonly known as:  ROBAXIN  Take 1 tablet  (500 mg total) by mouth 3 (three) times daily.   Indication:  Musculoskeletal Pain     nabumetone 500 MG tablet  Commonly known as:  RELAFEN  Take 1 tablet (500 mg total) by mouth 2 (two) times daily.   Indication:  Joint Damage causing Pain and Loss of Function     pravastatin 40 MG tablet  Commonly known as:  PRAVACHOL  Take 1 tablet (40 mg total) by mouth every evening.   Indication:  hyperlipidemia     sertraline 50 MG tablet  Commonly known as:  ZOLOFT  Take 1 tablet (50 mg total) by mouth daily.   Indication:  Major Depressive Disorder     traZODone 100 MG tablet  Commonly known as:  DESYREL  Take 1 tablet (100 mg total) by mouth at bedtime and may repeat dose one time if needed.   Indication:  Trouble Sleeping           Follow-up  Information   Follow up with Florencia Reasons - [facility] On 04/05/2013. (Tuesday, May 6, 10 AM to complete registration or you may go by [facility] to obtain forms prior to your appointment.)    Contact information:   [address]  [place], [place]  [address]  [phone]      Follow up with Dr. Omelia Blackwater. (Appointment in May for follow up medicaitons)    Contact information:   [address] [place], [place] [address] [phone]      Follow-up recommendations:  Activity:  As tolerated Diet:  Low-sodium heart healthy diet  Comments:  Patient will continue his care with Dr Omelia Blackwater and Florencia Reasons.  Total Discharge Time:  Greater than 30 minutes.  SignedNanine Means, PMH-NP 04/01/2013, 2:48 PM

## 2013-04-04 ENCOUNTER — Encounter: Payer: Self-pay | Admitting: Family Medicine

## 2013-04-04 ENCOUNTER — Ambulatory Visit (INDEPENDENT_AMBULATORY_CARE_PROVIDER_SITE_OTHER): Payer: Medicare Other | Admitting: Family Medicine

## 2013-04-04 VITALS — BP 150/80 | HR 118 | Resp 16 | Ht 68.0 in | Wt 180.0 lb

## 2013-04-04 DIAGNOSIS — E669 Obesity, unspecified: Secondary | ICD-10-CM

## 2013-04-04 DIAGNOSIS — F329 Major depressive disorder, single episode, unspecified: Secondary | ICD-10-CM

## 2013-04-04 DIAGNOSIS — IMO0002 Reserved for concepts with insufficient information to code with codable children: Secondary | ICD-10-CM

## 2013-04-04 DIAGNOSIS — I10 Essential (primary) hypertension: Secondary | ICD-10-CM | POA: Diagnosis not present

## 2013-04-04 DIAGNOSIS — F17200 Nicotine dependence, unspecified, uncomplicated: Secondary | ICD-10-CM | POA: Diagnosis not present

## 2013-04-04 DIAGNOSIS — E785 Hyperlipidemia, unspecified: Secondary | ICD-10-CM

## 2013-04-04 MED ORDER — FENTANYL 50 MCG/HR TD PT72: 1.0000 | MEDICATED_PATCH | TRANSDERMAL | Status: DC

## 2013-04-04 NOTE — Patient Instructions (Addendum)
F/u in 4 weeks.  I am thankful that you have improved greatly.  Keep all appointments with mental health, espescially theraopy, also start the zoloft prescribed.  You are now on the fentanyl patch for chronic pain management  Please schedule your appointment for mammogram  Work on smoking cessation, it is great that you have started this

## 2013-04-04 NOTE — Assessment & Plan Note (Signed)
Improved, to start therapy locally which will help alot

## 2013-04-04 NOTE — Assessment & Plan Note (Signed)
Improved. Pt applauded on succesful weight loss through lifestyle change, and encouraged to continue same. Weight loss goal set for the next several months.  

## 2013-04-04 NOTE — Assessment & Plan Note (Signed)
Sub optimal control. No changes in med this visit. DASH diet and commitment to daily physical activity for a minimum of 30 minutes discussed and encouraged, as a part of hypertension management. The importance of attaining a healthy weight is also discussed.

## 2013-04-04 NOTE — Progress Notes (Signed)
  Subjective:    Patient ID: Susan Walker, female    DOB: 04-05-1961, 52 y.o.   MRN: [id number]  HPI Pt in for review after recent psych hospitalization, after she voluntarily comited herself. Reports improved calm and self control, has not filled the zoloft, only taking tazodone, I advise her tio fill the zoloft. She starts therapy locally tomorrow, which will be very good for her. She will continue with her old psychiatrist. States she is down to 2 ciggs per day and wants to quit. Concerned about chronic pain management, she has established disc disease, she will start fentanyl and this is what she will get from this office   Review of Systems See HPI Denies recent fever or chills. Denies sinus pressure, nasal congestion, ear pain or sore throat. Denies chest congestion, productive cough or wheezing. Denies chest pains, palpitations and leg swelling Denies abdominal pain, nausea, vomiting,diarrhea or constipation.   Denies dysuria, frequency, hesitancy or incontinence. Chronic neck and back pain, unchanged. Denies headaches, seizures, numbness, or tingling. Denies uncontrolled  Depression,still has  anxiety and  Insomnia.Overall 100% better Denies skin break down or rash.        Objective:   Physical Exam  Patient alert and oriented and in no cardiopulmonary distress.  HEENT: No facial asymmetry, EOMI, no sinus tenderness,  oropharynx pink and moist.  Neck decreased though adequate ROM  no adenopathy.  Chest: Clear to auscultation bilaterally.Decreased though adequate air entry  CVS: S1, S2 no murmurs, no S3.  ABD: Soft non tender. Bowel sounds normal.  Ext: No edema  MS: Adequate though reduced  ROM spine, shoulders, hips and knees.  Skin: Intact, no ulcerations or rash noted.  Psych: Good eye contact, normal affect. Memory intact much less anxious not  depressed appearing.  CNS: CN 2-12 intact, power, tone and sensation normal throughout.       Assessment &  Plan:

## 2013-04-04 NOTE — Assessment & Plan Note (Addendum)
Down to 2 cigarettes per day, trying to quit Patient counseled for approximately 5 minutes regarding the health risks of ongoing nicotine use, specifically all types of cancer, heart disease, stroke and respiratory failure. The options available for help with cessation ,the behavioral changes to assist the process, and the option to either gradully reduce usage  Or abruptly stop.is also discussed. Pt is also encouraged to set specific goals in number of cigarettes used daily, as well as to set a quit date.

## 2013-04-04 NOTE — Assessment & Plan Note (Signed)
Unchanged, fentanyl; patch to be used

## 2013-04-04 NOTE — Assessment & Plan Note (Signed)
Hyperlipidemia:Low fat diet discussed and encouraged.  Updated lab in next 4 month, continue medication

## 2013-04-05 ENCOUNTER — Ambulatory Visit (HOSPITAL_COMMUNITY): Payer: Self-pay | Admitting: Psychiatry

## 2013-04-05 NOTE — Progress Notes (Signed)
Patient Discharge Instructions:  After Visit Summary (AVS):   Faxed to:  04/05/13 Discharge Summary Note:   Faxed to:  04/05/13 Psychiatric Admission Assessment Note:   Faxed to:  04/05/13 Suicide Risk Assessment - Discharge Assessment:   Faxed to:  04/05/13 Faxed/Sent to the Next Level Care provider:  04/05/13 Next Level Care Provider Has Access to the EMR, 04/05/13 Faxed to Dr. Omelia Blackwater @ [phone] Records provided to [facility] via [facility]/Epic access  Jerelene Redden, 04/05/2013, 2:26 PM

## 2013-04-06 ENCOUNTER — Telehealth: Payer: Self-pay | Admitting: Family Medicine

## 2013-04-06 ENCOUNTER — Telehealth: Payer: Self-pay

## 2013-04-06 NOTE — Telephone Encounter (Signed)
See previous message

## 2013-04-06 NOTE — Telephone Encounter (Signed)
Advise her two options, to see if over time she will get accustomed to the med, or stay off pain meds like she was in the hospital, the third option is a pain clinic. IGenerally let her know this is tolerated and approved even for elderly so I think that with time she may get accustomed to it, but if it makes her feel too badly stop, and the other options are as stated

## 2013-04-06 NOTE — Telephone Encounter (Signed)
Pt states she is supposed to change the fentanyl patch tomorrow at 10am but she is having bad side effects from it. She has been trembling and c/o a fast heartrate. Also making her groggy and some lightheaded. Doesn't want to take this med anymore. Please advise

## 2013-04-06 NOTE — Telephone Encounter (Signed)
She said she doesn't want to go to a pain clinic and she is sorry she could not tolerate the patch. At forst she said she would have to just do without meds but now she wants her old med back or even something less strong that she can tolerate. Wants a call back today

## 2013-04-07 ENCOUNTER — Other Ambulatory Visit: Payer: Self-pay | Admitting: Family Medicine

## 2013-04-07 MED ORDER — TRAMADOL HCL 50 MG PO TABS: ORAL_TABLET | ORAL | Status: DC

## 2013-04-07 NOTE — Telephone Encounter (Signed)
Let pt know that she reported abdominal pain with her old med and constipation, so that was not even an option. I will prescribe only tramadol as the option, nothing else, i have entered twice daily  and she will need to stay on that or go to a pain clinic Please remove her from pain contract list. Let her understand that she has NO Other options for pain management with me, and that no other adjustments will be made before the next scheduled visit which is 4 weeks from her last visit.

## 2013-04-11 ENCOUNTER — Ambulatory Visit (HOSPITAL_COMMUNITY): Payer: Self-pay | Admitting: Psychiatry

## 2013-04-11 NOTE — Telephone Encounter (Signed)
Patient aware- does not want tramadol or pain clinic referral

## 2013-04-19 DIAGNOSIS — S41009A Unspecified open wound of unspecified shoulder, initial encounter: Secondary | ICD-10-CM | POA: Diagnosis not present

## 2013-04-19 DIAGNOSIS — S41109A Unspecified open wound of unspecified upper arm, initial encounter: Secondary | ICD-10-CM | POA: Diagnosis not present

## 2013-05-03 ENCOUNTER — Encounter (HOSPITAL_COMMUNITY): Payer: Self-pay | Admitting: *Deleted

## 2013-05-03 ENCOUNTER — Other Ambulatory Visit: Payer: Self-pay

## 2013-05-03 ENCOUNTER — Emergency Department (HOSPITAL_COMMUNITY)
Admission: EM | Admit: 2013-05-03 | Discharge: 2013-05-03 | Disposition: A | Discharge status: Home / Self Care | Payer: Medicare Other | Attending: Emergency Medicine | Admitting: Emergency Medicine

## 2013-05-03 DIAGNOSIS — E669 Obesity, unspecified: Secondary | ICD-10-CM | POA: Insufficient documentation

## 2013-05-03 DIAGNOSIS — F319 Bipolar disorder, unspecified: Secondary | ICD-10-CM | POA: Insufficient documentation

## 2013-05-03 DIAGNOSIS — Z8673 Personal history of transient ischemic attack (TIA), and cerebral infarction without residual deficits: Secondary | ICD-10-CM | POA: Diagnosis not present

## 2013-05-03 DIAGNOSIS — R5381 Other malaise: Secondary | ICD-10-CM | POA: Insufficient documentation

## 2013-05-03 DIAGNOSIS — Z8739 Personal history of other diseases of the musculoskeletal system and connective tissue: Secondary | ICD-10-CM | POA: Diagnosis not present

## 2013-05-03 DIAGNOSIS — Z872 Personal history of diseases of the skin and subcutaneous tissue: Secondary | ICD-10-CM | POA: Insufficient documentation

## 2013-05-03 DIAGNOSIS — Z8719 Personal history of other diseases of the digestive system: Secondary | ICD-10-CM | POA: Diagnosis not present

## 2013-05-03 DIAGNOSIS — IMO0002 Reserved for concepts with insufficient information to code with codable children: Secondary | ICD-10-CM | POA: Insufficient documentation

## 2013-05-03 DIAGNOSIS — Z862 Personal history of diseases of the blood and blood-forming organs and certain disorders involving the immune mechanism: Secondary | ICD-10-CM | POA: Insufficient documentation

## 2013-05-03 DIAGNOSIS — Z8679 Personal history of other diseases of the circulatory system: Secondary | ICD-10-CM | POA: Diagnosis not present

## 2013-05-03 DIAGNOSIS — F411 Generalized anxiety disorder: Secondary | ICD-10-CM | POA: Insufficient documentation

## 2013-05-03 DIAGNOSIS — I10 Essential (primary) hypertension: Secondary | ICD-10-CM | POA: Insufficient documentation

## 2013-05-03 DIAGNOSIS — R079 Chest pain, unspecified: Secondary | ICD-10-CM | POA: Insufficient documentation

## 2013-05-03 DIAGNOSIS — Z79899 Other long term (current) drug therapy: Secondary | ICD-10-CM | POA: Insufficient documentation

## 2013-05-03 DIAGNOSIS — F419 Anxiety disorder, unspecified: Secondary | ICD-10-CM

## 2013-05-03 DIAGNOSIS — Z8639 Personal history of other endocrine, nutritional and metabolic disease: Secondary | ICD-10-CM | POA: Insufficient documentation

## 2013-05-03 DIAGNOSIS — F17200 Nicotine dependence, unspecified, uncomplicated: Secondary | ICD-10-CM | POA: Insufficient documentation

## 2013-05-03 DIAGNOSIS — R5383 Other fatigue: Secondary | ICD-10-CM | POA: Diagnosis not present

## 2013-05-03 LAB — CBC WITH DIFFERENTIAL/PLATELET
Basophils Absolute: 0 10*3/uL (ref 0.0–0.1)
Basophils Relative: 1 % (ref 0–1)
HCT: 38 % (ref 36.0–46.0)
Lymphocytes Relative: 37 % (ref 12–46)
MCHC: 35.3 g/dL (ref 30.0–36.0)
Neutro Abs: 4.3 10*3/uL (ref 1.7–7.7)
Neutrophils Relative %: 55 % (ref 43–77)
Platelets: 228 10*3/uL (ref 150–400)
RDW: 13.2 % (ref 11.5–15.5)
WBC: 7.8 10*3/uL (ref 4.0–10.5)

## 2013-05-03 LAB — BASIC METABOLIC PANEL
CO2: 27 mEq/L (ref 19–32)
Chloride: 106 mEq/L (ref 96–112)
Creatinine, Ser: 0.52 mg/dL (ref 0.50–1.10)
GFR calc Af Amer: >90 mL/min (ref >90)
Potassium: 2.9 mEq/L — ABNORMAL LOW (ref 3.5–5.1)
Sodium: 143 mEq/L (ref 135–145)

## 2013-05-03 MED ORDER — LORAZEPAM 1 MG PO TABS: 2.0000 mg | ORAL_TABLET | Freq: Three times a day (TID) | ORAL | Status: DC | PRN

## 2013-05-03 MED ORDER — TRAZODONE HCL 100 MG PO TABS
100.0000 mg | ORAL_TABLET | Freq: Once | ORAL | Status: DC
  Filled 2013-05-03: qty 1

## 2013-05-03 MED ORDER — KETOROLAC TROMETHAMINE 60 MG/2ML IM SOLN
60.0000 mg | Freq: Once | INTRAMUSCULAR | Status: AC
  Administered 2013-05-03: 60 mg via INTRAMUSCULAR
  Filled 2013-05-03: qty 2

## 2013-05-03 MED ORDER — LORAZEPAM 2 MG/ML IJ SOLN
2.0000 mg | Freq: Once | INTRAMUSCULAR | Status: AC
  Administered 2013-05-03: 2 mg via INTRAMUSCULAR
  Filled 2013-05-03: qty 1

## 2013-05-03 MED ORDER — POTASSIUM CHLORIDE CRYS ER 20 MEQ PO TBCR: 40.0000 meq | EXTENDED_RELEASE_TABLET | Freq: Once | ORAL | Status: DC

## 2013-05-03 NOTE — ED Provider Notes (Signed)
History     CSN: [id number]  Arrival date & time 05/03/13  3086   First MD Initiated Contact with Patient 05/03/13 1942      Chief Complaint  Patient presents with  . Dizziness  . Weakness    (Consider location/radiation/quality/duration/timing/severity/associated sxs/prior treatment) Patient is a 52 y.o. female presenting with anxiety. The history is provided by the patient (pt complains of being anxious and difficulty sleeping).  Anxiety This is a recurrent problem. The current episode started more than 2 days ago. The problem occurs constantly. The problem has not changed since onset.Associated symptoms include chest pain. Pertinent negatives include no abdominal pain and no headaches. Nothing aggravates the symptoms. Nothing relieves the symptoms. The treatment provided no relief.    Past Medical History  Diagnosis Date  . Chest pain, unspecified 12/2006    Normal Echo and myovue   . Dyspnea   . Nicotine addiction   . Back pain     With radiculopathy  . Depression   . Hyperlipidemia   . Hypertension   . Panic attack   . Palpitations   . Cerebrovascular disease     carotid bruit  . Obesity   . Gastroesophageal reflux disease   . Bipolar 1 disorder   . Tobacco abuse     Nicotine addiction  . Urticaria     Past Surgical History  Procedure Laterality Date  . Total abdominal hysterectomy  2002  . Cholecystectomy  2004  . Cesarean section    . Colonoscopy  05/20/2012    Procedure: COLONOSCOPY;  Surgeon: Malissa Hippo, MD;  Location: [facility];  Service: Endoscopy;  Laterality: N/A;  200    Family History  Problem Relation Age of Onset  . Heart failure Mother     History  Substance Use Topics  . Smoking status: Current Every Day Smoker -- 0.50 packs/day for 30 years    Types: Cigarettes  . Smokeless tobacco: Never Used     Comment: 6 cigarettes a day since age 46  . Alcohol Use: No    OB History   Grav Para Term Preterm Abortions TAB SAB Ect Mult  Living                  Review of Systems  Constitutional: Negative for appetite change and fatigue.  HENT: Negative for congestion, sinus pressure and ear discharge.   Eyes: Negative for discharge.  Respiratory: Negative for cough.   Cardiovascular: Positive for chest pain.  Gastrointestinal: Negative for abdominal pain and diarrhea.  Genitourinary: Negative for frequency and hematuria.  Musculoskeletal: Negative for back pain.  Skin: Negative for rash.  Neurological: Negative for seizures and headaches.  Psychiatric/Behavioral: Positive for agitation. Negative for hallucinations.    Allergies  Iohexol; Vilazodone hcl; Celery oil; Gabapentin; and Methadone  Home Medications   Current Outpatient Rx  Name  Route  Sig  Dispense  Refill  . amLODipine (NORVASC) 10 MG tablet   Oral   Take 1 tablet (10 mg total) by mouth daily.   30 tablet   4   . fentaNYL (DURAGESIC - DOSED MCG/HR) 50 MCG/HR               . hydrOXYzine (ATARAX/VISTARIL) 25 MG tablet   Oral   Take 1 tablet (25 mg total) by mouth 4 (four) times daily as needed for itching or anxiety.   30 tablet   0   . traZODone (DESYREL) 100 MG tablet   Oral  Take 1 tablet (100 mg total) by mouth at bedtime and may repeat dose one time if needed.   30 tablet   0   . LORazepam (ATIVAN) 1 MG tablet   Oral   Take 2 tablets (2 mg total) by mouth every 8 (eight) hours as needed for anxiety.   20 tablet   0     BP 170/87  Pulse 96  Temp(Src) 99.3 F (37.4 C) (Oral)  Resp 18  Ht 5\' 8"  (1.727 m)  Wt 175 lb (79.379 kg)  BMI 26.61 kg/m2  SpO2 95%  Physical Exam  Constitutional: She is oriented to person, place, and time. She appears well-developed.  HENT:  Head: Normocephalic.  Eyes: Conjunctivae and EOM are normal. No scleral icterus.  Neck: Neck supple. No thyromegaly present.  Cardiovascular: Normal rate and regular rhythm.  Exam reveals no gallop and no friction rub.   No murmur  heard. Pulmonary/Chest: No stridor. She has no wheezes. She has no rales. She exhibits no tenderness.  Abdominal: She exhibits no distension. There is no tenderness. There is no rebound.  Musculoskeletal: Normal range of motion. She exhibits no edema.  Lymphadenopathy:    She has no cervical adenopathy.  Neurological: She is oriented to person, place, and time. Coordination normal.  Skin: No rash noted. No erythema.  Psychiatric:  Pt very anxious    ED Course  Procedures (including critical care time)  Labs Reviewed  BASIC METABOLIC PANEL - Abnormal; Notable for the following:    Potassium 2.9 (*)    Glucose, Bld 105 (*)    All other components within normal limits  CBC WITH DIFFERENTIAL   No results found.   1. Anxiety       MDM          Benny Lennert, MD 05/03/13 2141

## 2013-05-03 NOTE — ED Notes (Addendum)
Pt reporting feeling "real weak and dizzy for a week or more."  Also reporting rapid heart.  Denies nausea or vomiting.  Reporting some abdominal cramping and diarrhea.  Pt reports about a month ago she was at behavorial health and was taken off her xanax and pain medications.  Pt states "I just need something for my nerves."

## 2013-05-03 NOTE — ED Notes (Signed)
Pt insisted on leaving before meds could be given

## 2013-05-05 ENCOUNTER — Encounter: Payer: Self-pay | Admitting: Family Medicine

## 2013-05-05 ENCOUNTER — Ambulatory Visit (INDEPENDENT_AMBULATORY_CARE_PROVIDER_SITE_OTHER): Payer: Medicare Other | Admitting: Family Medicine

## 2013-05-05 VITALS — BP 132/80 | HR 96 | Resp 16 | Ht 68.0 in | Wt 182.0 lb

## 2013-05-05 DIAGNOSIS — E785 Hyperlipidemia, unspecified: Secondary | ICD-10-CM

## 2013-05-05 DIAGNOSIS — F329 Major depressive disorder, single episode, unspecified: Secondary | ICD-10-CM

## 2013-05-05 DIAGNOSIS — F17200 Nicotine dependence, unspecified, uncomplicated: Secondary | ICD-10-CM | POA: Diagnosis not present

## 2013-05-05 DIAGNOSIS — IMO0002 Reserved for concepts with insufficient information to code with codable children: Secondary | ICD-10-CM

## 2013-05-05 DIAGNOSIS — F3289 Other specified depressive episodes: Secondary | ICD-10-CM

## 2013-05-05 DIAGNOSIS — F411 Generalized anxiety disorder: Secondary | ICD-10-CM | POA: Diagnosis not present

## 2013-05-05 DIAGNOSIS — E8881 Metabolic syndrome: Secondary | ICD-10-CM

## 2013-05-05 DIAGNOSIS — I10 Essential (primary) hypertension: Secondary | ICD-10-CM

## 2013-05-05 MED ORDER — TRAZODONE HCL 100 MG PO TABS: ORAL_TABLET | ORAL | Status: DC

## 2013-05-05 MED ORDER — OXYCODONE-ACETAMINOPHEN 10-325 MG PO TABS: ORAL_TABLET | ORAL | Status: DC

## 2013-05-05 MED ORDER — PRAVASTATIN SODIUM 40 MG PO TABS: 40.0000 mg | ORAL_TABLET | Freq: Every evening | ORAL | Status: DC

## 2013-05-05 MED ORDER — BUSPIRONE HCL 7.5 MG PO TABS: 7.5000 mg | ORAL_TABLET | Freq: Three times a day (TID) | ORAL | Status: AC

## 2013-05-05 NOTE — Patient Instructions (Addendum)
F/u in 3 month  Meds are as prescribed.  You are referred to mental health stop at checkout

## 2013-05-05 NOTE — Progress Notes (Signed)
  Subjective:    Patient ID: Susan Walker, female    DOB: 11/01/1961, 52 y.o.   MRN: [id number]  HPI The PT is here for follow up and re-evaluation of chronic medical conditions, medication management and review of any available recent lab and radiology data.  Preventive health is updated, specifically  Cancer screening and Immunization.   Still needs to establish with local psychiatrist to get the care she needs. Was recently hospitalized after self commitment, and still has not seen a psychiatrist or therapist since her d/c . Appt will be made locally, she has resisted this in the past, but states she is willing to follow through now  Recently went to the ED for uncontrolled pain, again asking to have meds changed again, back to what she had in the past. I CLEARLY make her understand, that there is absolutely no room for ay other change after this one through this office. She will collect meds on 2 weekly intervals due to high pill burden, she dos have established disc disease      Review of Systems    See HPI Denies recent fever or chills. Denies sinus pressure, nasal congestion, ear pain or sore throat. Denies chest congestion, productive cough or wheezing. Denies chest pains, palpitations and leg swelling Denies abdominal pain, nausea, vomiting,diarrhea or constipation.   Denies dysuria, frequency, hesitancy or incontinence. Chronic neck and  back pain and limitation in mobility Denies headaches, seizures, numbness, or tingling. C/o depression, anxiety and  Insomnia.Not hallucinating, homicidal or suicidal Denies skin break down or rash.     Objective:   Physical Exam  Patient alert and oriented and in no cardiopulmonary distress.  HEENT: No facial asymmetry, EOMI, no sinus tenderness,  oropharynx pink and moist.  Neck decreased ROM no adenopathy.  Chest: Clear to auscultation bilaterally.Decreased though adequate air entry  CVS: S1, S2 no murmurs, no S3.  ABD: Soft  non tender. Bowel sounds normal.  Ext: No edema  MS: decreased  ROM spine,adequate in  shoulders, hips and knees.  Skin: Intact, no ulcerations or rash noted.  Psych: Good eye contact, normal affect. Memory mildly impaired, anxious not  depressed appearing.  CNS: CN 2-12 intact,       Assessment & Plan:

## 2013-05-07 IMAGING — CR DG LUMBAR SPINE COMPLETE 4+V
5 series · 5 of 5 positions shown · non-contrast
Comparison: CT scan 04/23/2012.

CLINICAL DATA: Low back and left leg pain.

LUMBAR SPINE - COMPLETE 4+ VIEW

[view not recorded (1 of 5)]
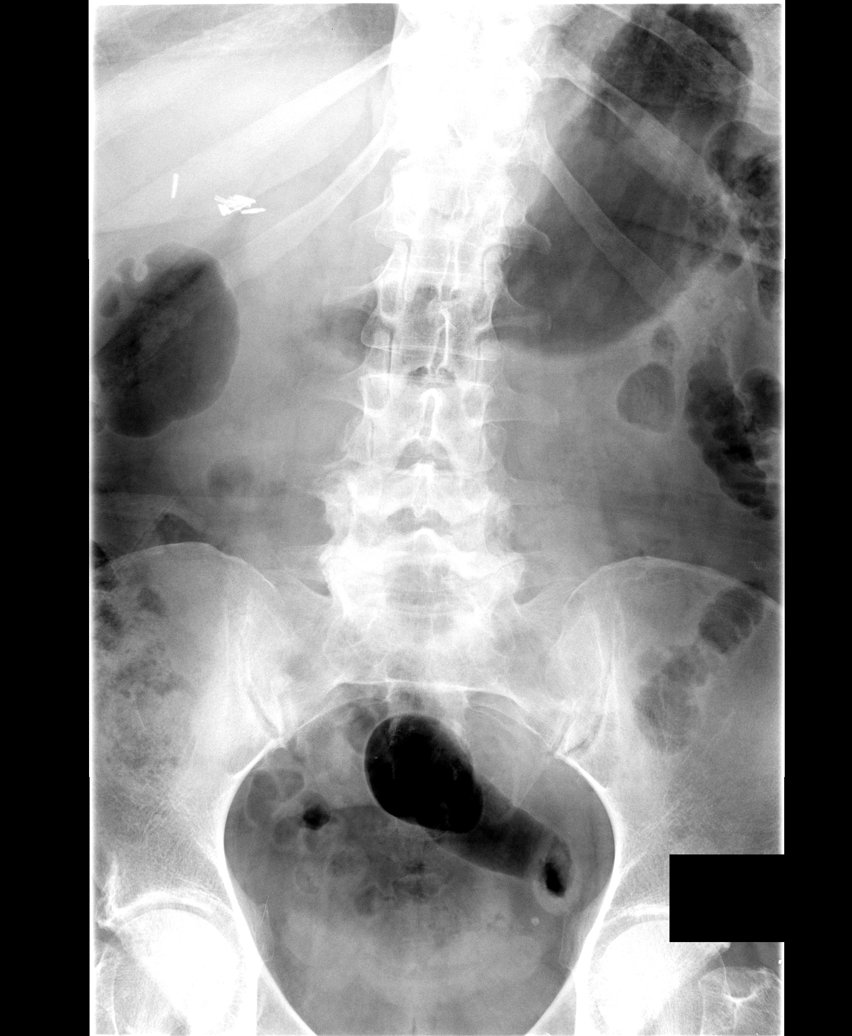

[view not recorded (2 of 5)]
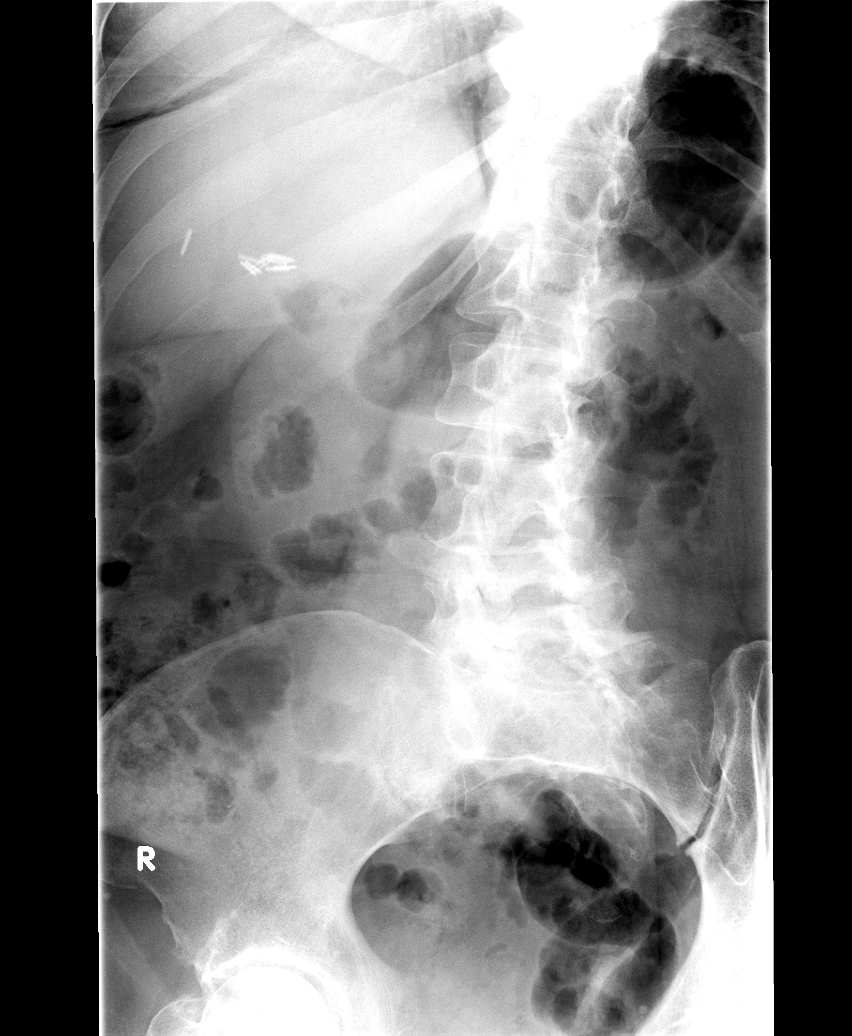

[view not recorded (3 of 5)]
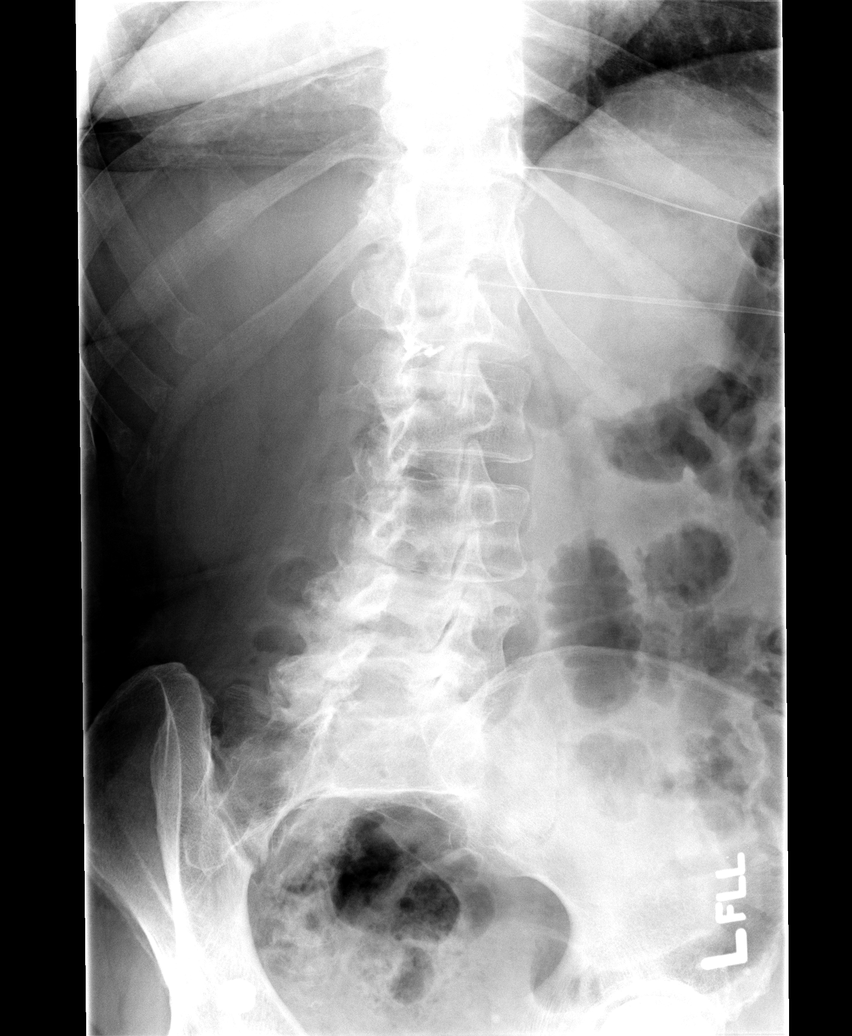

[view not recorded (4 of 5)]
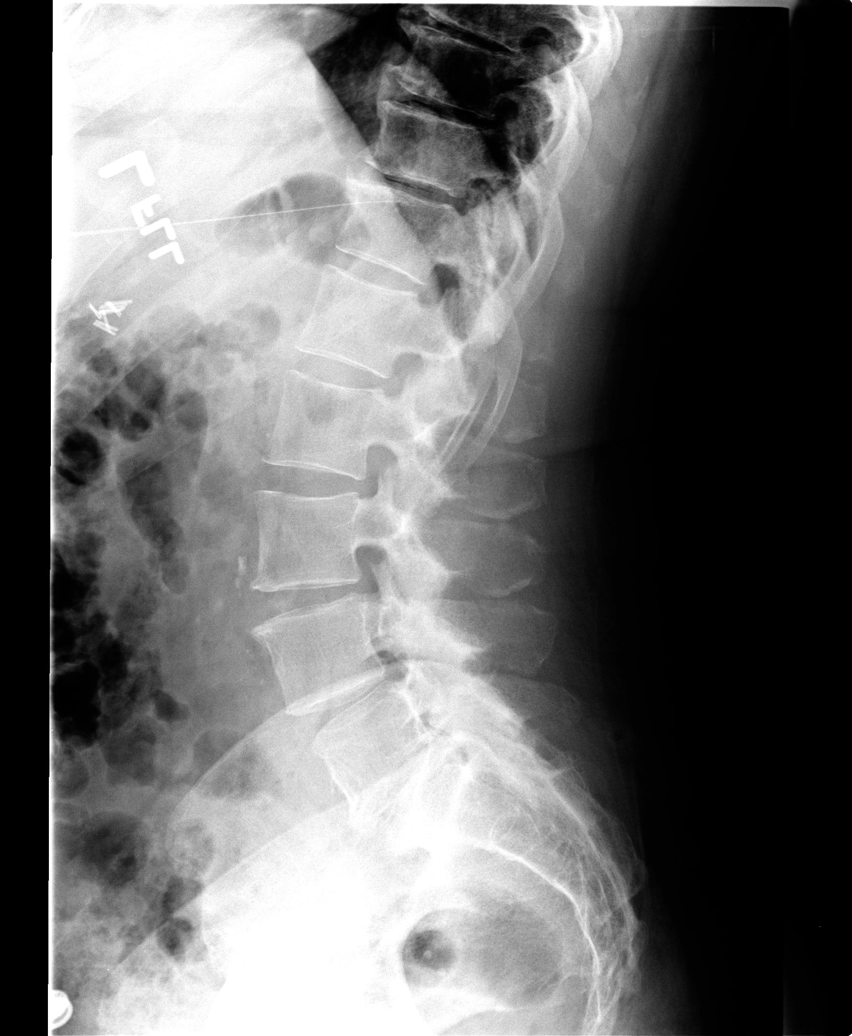

[view not recorded (5 of 5)]
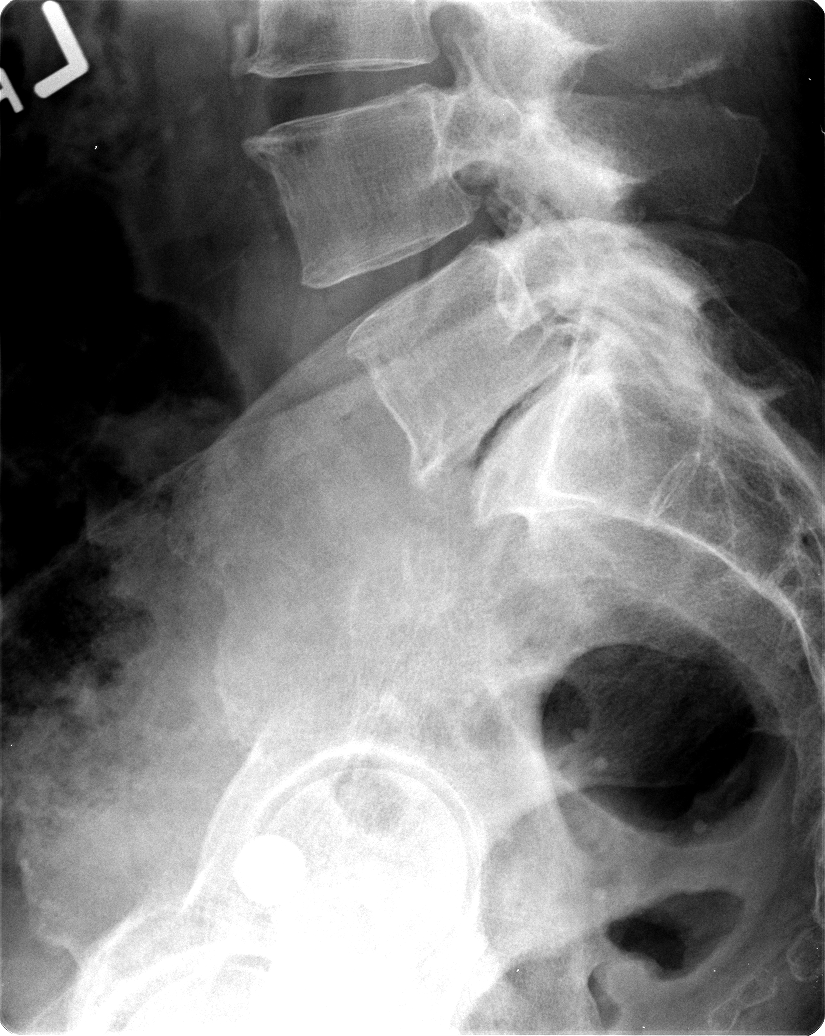

[5 of 5 positions shown; findings below may reference images not displayed]

FINDINGS: Stable alignment of the lumbar vertebral bodies.  Stable
degenerative disc disease and facet disease at L4-5 and L5-S1.  No
acute bony findings.  The visualized bony pelvis is intact.
IMPRESSION: Stable degenerative changes in the lower lumbar spine.  No acute
bony findings.

## 2013-05-09 ENCOUNTER — Telehealth: Payer: Self-pay | Admitting: Family Medicine

## 2013-05-09 NOTE — Telephone Encounter (Signed)
Spoke withj pt and made it clear that she NEEDS to make and keep an appt  With behavioral health to benfit from their services, which she DEFINITELY needs, she understands

## 2013-05-09 NOTE — Telephone Encounter (Signed)
noted 

## 2013-05-10 NOTE — Telephone Encounter (Signed)
Dr. Lodema Hong spoke with patient

## 2013-05-17 NOTE — Assessment & Plan Note (Signed)
Increased CV risk explained to pt , hence the need for lifestyle change to reduce this

## 2013-05-17 NOTE — Assessment & Plan Note (Signed)
Improved on current meds, but needs to establish with local providers, referral entered

## 2013-05-17 NOTE — Assessment & Plan Note (Signed)
Improved, needs close mental health f/u

## 2013-05-17 NOTE — Assessment & Plan Note (Addendum)
Final change back to original prscription, pt will get 2 week supplies at a time, she understands that there will be NO MORE changes in pain meds by me

## 2013-05-17 NOTE — Assessment & Plan Note (Signed)
Controlled, no change in medication DASH diet and commitment to daily physical activity for a minimum of 30 minutes discussed and encouraged, as a part of hypertension management. The importance of attaining a healthy weight is also discussed.  

## 2013-05-17 NOTE — Assessment & Plan Note (Signed)
Uncontrolled, pt need to take med as prescribed nd follow a low fat diet

## 2013-05-17 NOTE — Assessment & Plan Note (Signed)
Unchanged. Patient counseled for approximately 5 minutes regarding the health risks of ongoing nicotine use, specifically all types of cancer, heart disease, stroke and respiratory failure. The options available for help with cessation ,the behavioral changes to assist the process, and the option to either gradully reduce usage  Or abruptly stop.is also discussed. Pt is also encouraged to set specific goals in number of cigarettes used daily, as well as to set a quit date.  

## 2013-05-19 DIAGNOSIS — F4310 Post-traumatic stress disorder, unspecified: Secondary | ICD-10-CM | POA: Diagnosis not present

## 2013-05-20 ENCOUNTER — Other Ambulatory Visit: Payer: Self-pay

## 2013-05-20 DIAGNOSIS — IMO0002 Reserved for concepts with insufficient information to code with codable children: Secondary | ICD-10-CM

## 2013-05-20 MED ORDER — OXYCODONE-ACETAMINOPHEN 10-325 MG PO TABS: ORAL_TABLET | ORAL | Status: DC

## 2013-05-30 ENCOUNTER — Telehealth: Payer: Self-pay | Admitting: Family Medicine

## 2013-05-30 NOTE — Telephone Encounter (Signed)
Noted  

## 2013-06-23 ENCOUNTER — Other Ambulatory Visit: Payer: Self-pay

## 2013-06-23 DIAGNOSIS — IMO0002 Reserved for concepts with insufficient information to code with codable children: Secondary | ICD-10-CM

## 2013-06-23 MED ORDER — OXYCODONE-ACETAMINOPHEN 10-325 MG PO TABS: ORAL_TABLET | ORAL | Status: DC

## 2013-07-22 ENCOUNTER — Other Ambulatory Visit: Payer: Self-pay

## 2013-07-22 DIAGNOSIS — IMO0002 Reserved for concepts with insufficient information to code with codable children: Secondary | ICD-10-CM

## 2013-07-22 MED ORDER — OXYCODONE-ACETAMINOPHEN 10-325 MG PO TABS: ORAL_TABLET | ORAL | Status: DC

## 2013-08-18 ENCOUNTER — Other Ambulatory Visit: Payer: Self-pay

## 2013-08-18 DIAGNOSIS — IMO0002 Reserved for concepts with insufficient information to code with codable children: Secondary | ICD-10-CM

## 2013-08-18 MED ORDER — OXYCODONE-ACETAMINOPHEN 10-325 MG PO TABS: ORAL_TABLET | ORAL | Status: DC

## 2013-08-24 ENCOUNTER — Ambulatory Visit (INDEPENDENT_AMBULATORY_CARE_PROVIDER_SITE_OTHER): Payer: Medicare Other | Admitting: Family Medicine

## 2013-08-24 ENCOUNTER — Encounter: Payer: Self-pay | Admitting: Family Medicine

## 2013-08-24 VITALS — BP 128/80 | HR 86 | Resp 16 | Wt 182.1 lb

## 2013-08-24 DIAGNOSIS — E785 Hyperlipidemia, unspecified: Secondary | ICD-10-CM | POA: Diagnosis not present

## 2013-08-24 DIAGNOSIS — Z23 Encounter for immunization: Secondary | ICD-10-CM

## 2013-08-24 DIAGNOSIS — I10 Essential (primary) hypertension: Secondary | ICD-10-CM

## 2013-08-24 DIAGNOSIS — F17200 Nicotine dependence, unspecified, uncomplicated: Secondary | ICD-10-CM | POA: Diagnosis not present

## 2013-08-24 DIAGNOSIS — IMO0002 Reserved for concepts with insufficient information to code with codable children: Secondary | ICD-10-CM | POA: Diagnosis not present

## 2013-08-24 DIAGNOSIS — F329 Major depressive disorder, single episode, unspecified: Secondary | ICD-10-CM

## 2013-08-24 DIAGNOSIS — F3289 Other specified depressive episodes: Secondary | ICD-10-CM

## 2013-08-24 NOTE — Progress Notes (Signed)
  Subjective:    Patient ID: Susan Walker, female    DOB: [birth year], 52 y.o.   MRN: [id number]  HPI The PT is here for follow up and re-evaluation of chronic medical conditions, medication management and review of any available recent lab and radiology data.  Preventive health is updated, specifically  Cancer screening and Immunization.  Still has had no mammogram and continues to refuse pelvic and breast exam.  The PT denies any adverse reactions to current medications since the last visit.  There are no new concerns.  There are no specific complaints       Review of Systems See HPI Denies recent fever or chills. Denies sinus pressure, nasal congestion, ear pain or sore throat. Denies chest congestion, productive cough or wheezing. Denies chest pains, palpitations and leg swelling Denies abdominal pain, nausea, vomiting,diarrhea or constipation.   Denies dysuria, frequency, hesitancy or incontinence. Chronic and unchanged joint pain,  and limitation in mobility. Denies headaches, seizures, numbness, or tingling. Chronic  depression, anxiety and  Insomnia.Slightlyy improved, continues to follow with Dr Omelia Blackwater Denies skin break down or rash.        Objective:   Physical Exam  Patient alert and oriented and in no cardiopulmonary distress.  HEENT: No facial asymmetry, EOMI, no sinus tenderness,  oropharynx pink and moist.  Neck adequate ROM,  no adenopathy.  Chest: Clear to auscultation bilaterally.Decreasd air enry  CVS: S1, S2 no murmurs, no S3.  ABD: Soft non tender. Bowel sounds normal.  Ext: No edema  MS: decreased ROM spine, shoulders, hips and knees.  Skin: Intact, no ulcerations or rash noted.  Psych: Good eye contact, normal affect. Memory intact mildly anxious not depressed appearing.  CNS: CN 2-12 intact, power, tone and sensation normal throughout.       Assessment & Plan:

## 2013-08-24 NOTE — Assessment & Plan Note (Signed)
Controlled, no change in medication DASH diet and commitment to daily physical activity for a minimum of 30 minutes discussed and encouraged, as a part of hypertension management. The importance of attaining a healthy weight is also discussed.  

## 2013-08-24 NOTE — Assessment & Plan Note (Signed)
Unchanged. Patient counseled for approximately 5 minutes regarding the health risks of ongoing nicotine use, specifically all types of cancer, heart disease, stroke and respiratory failure. The options available for help with cessation ,the behavioral changes to assist the process, and the option to either gradully reduce usage  Or abruptly stop.is also discussed. Pt is also encouraged to set specific goals in number of cigarettes used daily, as well as to set a quit date.  

## 2013-08-24 NOTE — Assessment & Plan Note (Signed)
Followed by psych [facility], slightly improved

## 2013-08-24 NOTE — Patient Instructions (Addendum)
Annual wellness in January, call if you need me before  Fasting lipid and cmp this week or before next month PLEASE  Flu vaccine today   You are improving  Please schedule and go for your mammogram  Please reduce cigarettes, you need to quit

## 2013-08-24 NOTE — Assessment & Plan Note (Signed)
Unchanged, pain management as before 

## 2013-08-24 NOTE — Assessment & Plan Note (Signed)
Hyperlipidemia:Low fat diet discussed and encouraged.  Updated lab needed 

## 2013-09-01 ENCOUNTER — Telehealth: Payer: Self-pay | Admitting: Family Medicine

## 2013-09-01 ENCOUNTER — Other Ambulatory Visit: Payer: Self-pay

## 2013-09-01 DIAGNOSIS — I10 Essential (primary) hypertension: Secondary | ICD-10-CM

## 2013-09-01 MED ORDER — AMLODIPINE BESYLATE 10 MG PO TABS: 10.0000 mg | ORAL_TABLET | Freq: Every day | ORAL | Status: DC

## 2013-09-01 NOTE — Telephone Encounter (Signed)
Med refilled.

## 2013-09-09 ENCOUNTER — Other Ambulatory Visit: Payer: Self-pay | Admitting: Family Medicine

## 2013-09-16 ENCOUNTER — Other Ambulatory Visit: Payer: Self-pay

## 2013-09-16 DIAGNOSIS — IMO0002 Reserved for concepts with insufficient information to code with codable children: Secondary | ICD-10-CM

## 2013-09-16 MED ORDER — OXYCODONE-ACETAMINOPHEN 10-325 MG PO TABS: ORAL_TABLET | ORAL | Status: DC

## 2013-10-06 ENCOUNTER — Telehealth: Payer: Self-pay | Admitting: Family Medicine

## 2013-10-07 NOTE — Telephone Encounter (Signed)
Noted  

## 2013-10-07 NOTE — Telephone Encounter (Signed)
When pt collects next script MUST submit urine for testing before she receives the script.Even if she gets "anxious " has to leave , etc, cannot get the script without submitting urine, no warning call to her before she comes for script also please

## 2013-10-14 ENCOUNTER — Other Ambulatory Visit: Payer: Self-pay

## 2013-10-14 DIAGNOSIS — IMO0002 Reserved for concepts with insufficient information to code with codable children: Secondary | ICD-10-CM

## 2013-10-14 MED ORDER — OXYCODONE-ACETAMINOPHEN 10-325 MG PO TABS: ORAL_TABLET | ORAL | Status: DC

## 2013-10-17 ENCOUNTER — Telehealth: Payer: Self-pay

## 2013-10-17 DIAGNOSIS — Z79899 Other long term (current) drug therapy: Secondary | ICD-10-CM

## 2013-10-17 NOTE — Telephone Encounter (Signed)
Lab ordered.

## 2013-10-18 LAB — PRESCRIPTION ABUSE MONITORING 15P, URINE
Amphetamine/Meth: NEGATIVE ng/mL
Barbiturate Screen, Urine: NEGATIVE ng/mL
Cannabinoid Scrn, Ur: NEGATIVE ng/mL
Creatinine, Urine: 173.23 mg/dL (ref >20.0)
Fentanyl, Ur: NEGATIVE ng/mL
Meperidine, Ur: NEGATIVE ng/mL
Propoxyphene: NEGATIVE ng/mL
Zolpidem, Urine: NEGATIVE ng/mL

## 2013-10-20 ENCOUNTER — Telehealth: Payer: Self-pay | Admitting: Family Medicine

## 2013-10-20 LAB — BENZODIAZEPINES (GC/LC/MS), URINE
Alprazolam (GC/LC/MS), ur confirm: NEGATIVE ng/mL
Diazepam (GC/LC/MS), ur confirm: NEGATIVE ng/mL
Estazolam (GC/LC/MS), ur confirm: NEGATIVE ng/mL
Flunitrazepam metabolite (GC/LC/MS), ur confirm: NEGATIVE ng/mL
Midazolam (GC/LC/MS), ur confirm: NEGATIVE ng/mL
Oxazepam (GC/LC/MS), ur confirm: NEGATIVE ng/mL
Temazepam (GC/LC/MS), ur confirm: NEGATIVE ng/mL

## 2013-10-20 LAB — OPIATES/OPIOIDS (LC/MS-MS)
Heroin (6-AM), UR: NEGATIVE ng/mL
Hydrocodone: 102 ng/mL
Hydromorphone: NEGATIVE ng/mL
Morphine Urine: NEGATIVE ng/mL
Norhydrocodone, Ur: 319 ng/mL
Oxymorphone: NEGATIVE ng/mL

## 2013-10-20 NOTE — Telephone Encounter (Signed)
Noted  

## 2013-10-20 NOTE — Telephone Encounter (Signed)
noted 

## 2013-10-21 ENCOUNTER — Other Ambulatory Visit: Payer: Self-pay | Admitting: Family Medicine

## 2013-11-10 ENCOUNTER — Emergency Department (HOSPITAL_COMMUNITY)
Admission: EM | Admit: 2013-11-10 | Discharge: 2013-11-10 | Disposition: A | Discharge status: Home / Self Care | Payer: Medicare Other | Attending: Emergency Medicine | Admitting: Emergency Medicine

## 2013-11-10 ENCOUNTER — Encounter (HOSPITAL_COMMUNITY): Payer: Self-pay | Admitting: Emergency Medicine

## 2013-11-10 DIAGNOSIS — I10 Essential (primary) hypertension: Secondary | ICD-10-CM | POA: Diagnosis not present

## 2013-11-10 DIAGNOSIS — M545 Low back pain, unspecified: Secondary | ICD-10-CM | POA: Diagnosis not present

## 2013-11-10 DIAGNOSIS — G8929 Other chronic pain: Secondary | ICD-10-CM | POA: Diagnosis not present

## 2013-11-10 DIAGNOSIS — R220 Localized swelling, mass and lump, head: Secondary | ICD-10-CM | POA: Insufficient documentation

## 2013-11-10 DIAGNOSIS — F3130 Bipolar disorder, current episode depressed, mild or moderate severity, unspecified: Secondary | ICD-10-CM | POA: Diagnosis not present

## 2013-11-10 DIAGNOSIS — Z872 Personal history of diseases of the skin and subcutaneous tissue: Secondary | ICD-10-CM | POA: Diagnosis not present

## 2013-11-10 DIAGNOSIS — R040 Epistaxis: Secondary | ICD-10-CM | POA: Insufficient documentation

## 2013-11-10 DIAGNOSIS — Z8673 Personal history of transient ischemic attack (TIA), and cerebral infarction without residual deficits: Secondary | ICD-10-CM | POA: Diagnosis not present

## 2013-11-10 DIAGNOSIS — F329 Major depressive disorder, single episode, unspecified: Secondary | ICD-10-CM

## 2013-11-10 DIAGNOSIS — F17200 Nicotine dependence, unspecified, uncomplicated: Secondary | ICD-10-CM | POA: Insufficient documentation

## 2013-11-10 DIAGNOSIS — IMO0001 Reserved for inherently not codable concepts without codable children: Secondary | ICD-10-CM | POA: Insufficient documentation

## 2013-11-10 DIAGNOSIS — F410 Panic disorder [episodic paroxysmal anxiety] without agoraphobia: Secondary | ICD-10-CM | POA: Insufficient documentation

## 2013-11-10 DIAGNOSIS — Z8719 Personal history of other diseases of the digestive system: Secondary | ICD-10-CM | POA: Diagnosis not present

## 2013-11-10 DIAGNOSIS — R5381 Other malaise: Secondary | ICD-10-CM | POA: Insufficient documentation

## 2013-11-10 DIAGNOSIS — R109 Unspecified abdominal pain: Secondary | ICD-10-CM | POA: Diagnosis not present

## 2013-11-10 DIAGNOSIS — IMO0002 Reserved for concepts with insufficient information to code with codable children: Secondary | ICD-10-CM | POA: Diagnosis not present

## 2013-11-10 DIAGNOSIS — F341 Dysthymic disorder: Secondary | ICD-10-CM | POA: Diagnosis not present

## 2013-11-10 DIAGNOSIS — Z79899 Other long term (current) drug therapy: Secondary | ICD-10-CM | POA: Diagnosis not present

## 2013-11-10 DIAGNOSIS — E669 Obesity, unspecified: Secondary | ICD-10-CM | POA: Insufficient documentation

## 2013-11-10 DIAGNOSIS — F419 Anxiety disorder, unspecified: Secondary | ICD-10-CM

## 2013-11-10 LAB — CBC WITH DIFFERENTIAL/PLATELET
Basophils Relative: 1 % (ref 0–1)
Eosinophils Absolute: 0.1 10*3/uL (ref 0.0–0.7)
Eosinophils Relative: 1 % (ref 0–5)
HCT: 39.9 % (ref 36.0–46.0)
Hemoglobin: 13.5 g/dL (ref 12.0–15.0)
Lymphs Abs: 2 10*3/uL (ref 0.7–4.0)
MCH: 31.8 pg (ref 26.0–34.0)
MCHC: 33.8 g/dL (ref 30.0–36.0)
MCV: 94.1 fL (ref 78.0–100.0)
Monocytes Absolute: 0.3 10*3/uL (ref 0.1–1.0)
Monocytes Relative: 5 % (ref 3–12)
Neutrophils Relative %: 63 % (ref 43–77)

## 2013-11-10 LAB — URINALYSIS, ROUTINE W REFLEX MICROSCOPIC
Glucose, UA: NEGATIVE mg/dL
Hgb urine dipstick: NEGATIVE
Ketones, ur: NEGATIVE mg/dL
Leukocytes, UA: NEGATIVE
Protein, ur: NEGATIVE mg/dL
pH: 6 (ref 5.0–8.0)

## 2013-11-10 LAB — RAPID URINE DRUG SCREEN, HOSP PERFORMED
Amphetamines: NOT DETECTED
Benzodiazepines: NOT DETECTED
Opiates: NOT DETECTED
Tetrahydrocannabinol: NOT DETECTED

## 2013-11-10 LAB — BASIC METABOLIC PANEL
BUN: 18 mg/dL (ref 6–23)
Creatinine, Ser: 0.72 mg/dL (ref 0.50–1.10)
GFR calc Af Amer: >90 mL/min (ref >90)
GFR calc non Af Amer: >90 mL/min (ref >90)
Glucose, Bld: 101 mg/dL — ABNORMAL HIGH (ref 70–99)

## 2013-11-10 NOTE — ED Notes (Signed)
Pt states abominal pain x 2 months. Lower back pain x a couple of months also. Pt states eyes feel like they are swollen and "feel like I can't see right" and "feel like my whole face is drooping" x 2 weeks. Pt states generalized weakness.Pt states, "My whole body hurts but mostly to my stomach, but it's all over"

## 2013-11-10 NOTE — ED Provider Notes (Signed)
CSN: [id number]     Arrival date & time 11/10/13  8295 History   First MD Initiated Contact with Patient 11/10/13 [phone]     Chief Complaint  Patient presents with  . multiple complaints.    (Consider location/radiation/quality/duration/timing/severity/associated sxs/prior Treatment) HPI  Patient describes feeling anxious: She has been off of her Klonopin for 2-3 weeks. She reports exacerbation of her back pain: She's been present for 2-3 weeks. She reports feeling depressed. She is out of her antidepressant for more than 2 weeks. I asked her why she is out of her medications she states "you now much it takes to get on down there to the doctor. Besides, my doctor is in [place] for my depression. . Patient is followed with urine toxicologies before prescriptions are given for any controlled substance. Dr. Lodema Hong does treat  the patient's chronic back pain. The patient reports this is  with Percocet.   Denies fevers chills acute headache numbness weakness significant chest pain or shortness of breath..  no vomiting no diarrhea no dysuria. No stroke symptoms. No acute cardiac symptoms.   Past Medical History  Diagnosis Date  . Chest pain, unspecified 12/2006    Normal Echo and myovue   . Dyspnea   . Nicotine addiction   . Back pain     With radiculopathy  . Depression   . Hyperlipidemia   . Hypertension   . Panic attack   . Palpitations   . Cerebrovascular disease     carotid bruit  . Obesity   . Gastroesophageal reflux disease   . Bipolar 1 disorder   . Tobacco abuse     Nicotine addiction  . Urticaria    Past Surgical History  Procedure Laterality Date  . Total abdominal hysterectomy  2002  . Cholecystectomy  2004  . Cesarean section    . Colonoscopy  05/20/2012    Procedure: COLONOSCOPY;  Surgeon: Malissa Hippo, MD;  Location: [facility];  Service: Endoscopy;  Laterality: N/A;  200   Family History  Problem Relation Age of Onset  . Heart failure Mother    History   Substance Use Topics  . Smoking status: Current Every Day Smoker -- 0.50 packs/day for 30 years    Types: Cigarettes  . Smokeless tobacco: Never Used     Comment: 6 cigarettes a day since age 19  . Alcohol Use: No   OB History   Grav Para Term Preterm Abortions TAB SAB Ect Mult Living                 Review of Systems  Constitutional: Positive for fatigue. Negative for fever, chills, diaphoresis, activity change, appetite change and unexpected weight change.  HENT: Positive for facial swelling, nosebleeds and sinus pressure. Negative for sore throat, trouble swallowing and voice change.   Respiratory: Negative for chest tightness and shortness of breath.   Cardiovascular: Negative for chest pain and leg swelling.  Gastrointestinal: Positive for abdominal pain. Negative for nausea, vomiting, diarrhea, constipation, abdominal distention and rectal pain.  Genitourinary: Positive for dyspareunia. Negative for dysuria, frequency and flank pain.  Musculoskeletal: Positive for arthralgias, back pain and myalgias. Negative for gait problem and joint swelling.  Skin: Negative for pallor and rash.  Neurological: Negative for dizziness, seizures, weakness and headaches.  Hematological: Negative for adenopathy. Does not bruise/bleed easily.  Psychiatric/Behavioral: The patient is nervous/anxious.     Allergies  Iohexol; Vilazodone hcl; Celery oil; Gabapentin; and Methadone  Home Medications   Current Outpatient  Rx  Name  Route  Sig  Dispense  Refill  . amLODipine (NORVASC) 10 MG tablet   Oral   Take 1 tablet (10 mg total) by mouth daily.   30 tablet   4   . clonazePAM (KLONOPIN) 0.5 MG tablet      0.5 mg 3 (three) times daily as needed for anxiety.          . Eszopiclone 3 MG TABS      3 mg at bedtime as needed (depression).          . OLANZapine-FLUoxetine (SYMBYAX) 6-25 MG per capsule      1 capsule at bedtime.          Marland Kitchen oxyCODONE-acetaminophen (PERCOCET) 10-325 MG  per tablet   Oral   Take 1 tablet by mouth daily as needed (take 4-5 times daily prn for pain).          . traZODone (DESYREL) 100 MG tablet      TAKE 2 TABLETS BY MOUTH AT BEDTIME.   60 tablet   3    BP 150/85  Pulse 87  Temp(Src) 98.6 F (37 C) (Oral)  Resp 18  SpO2 98% Physical Exam  Constitutional: She appears well-developed and well-nourished. No distress.  HENT:  Head: Normocephalic and atraumatic.  Right Ear: Tympanic membrane is not injected. No hemotympanum.  Left Ear: Tympanic membrane is not injected. No hemotympanum.  Mouth/Throat: No oropharyngeal exudate or posterior oropharyngeal edema.  No periorbital edema.  Eyes: Conjunctivae are normal.  Not pale  Neck: Normal range of motion. No JVD present. No thyromegaly present.  Cardiovascular: Exam reveals no S3 and no S4.   Pulmonary/Chest: She has no decreased breath sounds. She has no wheezes. She has no rhonchi. She has no rales.  Abdominal: There is no tenderness.  Musculoskeletal:  Normal gait  Neurological:  No pronator drift. Normal cranial nerve exam. Normal peripheral neurological exam.  Psychiatric:  Speech was normal rate until. Normal judgment and insight. States she is anxious. Is not with pressured speech.    ED Course  Procedures (including critical care time) Labs Review Labs Reviewed  BASIC METABOLIC PANEL - Abnormal; Notable for the following:    Glucose, Bld 101 (*)    All other components within normal limits  CBC WITH DIFFERENTIAL  TROPONIN I  URINALYSIS, ROUTINE W REFLEX MICROSCOPIC  URINE RAPID DRUG SCREEN (HOSP PERFORMED)  TSH   Imaging Review No results found.  EKG Interpretation    Date/Time:  Thursday November 10 2013 10:31:02 EST Ventricular Rate:  81 PR Interval:  154 QRS Duration: 76 QT Interval:  412 QTC Calculation: 478 R Axis:   5 Text Interpretation:  Normal sinus rhythm Right atrial enlargement Possible Inferior infarct , age undetermined Abnormal ECG When  compared with ECG of 03-May-2013 19:22, Borderline criteria for Inferior infarct are now Present Nonspecific T wave abnormality no longer evident in Inferior leads Confirmed by Fayrene Fearing  MD, Margean Korell ([id number]) on 11/10/2013 10:44:31 AM            MDM   1. Depression   2. Anxiety   3. Chronic pain     Patient describes feeling anxious: She has been off of her Klonopin for 2-3 weeks. She reports exacerbation of her back pain: She's been present for 2-3 weeks. She reports feeling depressed. She is out of her antidepressant for more than 2 weeks. I asked her why she is out of her medications she states "you now much it  takes to get on down there to the doctor. Besides, my doctor is in [place] for my depression. . Patient is followed with urine toxicologies before prescriptions are given for any controlled substance. Dr. Lodema Hong does treat  the patient's chronic back pain. The patient reports this is  with Percocet. Think her symptoms are likely acute exacerbation of chronic conditions. Plan is medical screening exam and laboratory evaluation.  Roney Marion, MD 11/10/13 1225

## 2014-02-27 ENCOUNTER — Other Ambulatory Visit: Payer: Self-pay | Admitting: Family Medicine

## 2014-04-06 ENCOUNTER — Other Ambulatory Visit: Payer: Self-pay | Admitting: Family Medicine

## 2014-04-13 ENCOUNTER — Other Ambulatory Visit: Payer: Self-pay | Admitting: Family Medicine

## 2014-04-18 DIAGNOSIS — F332 Major depressive disorder, recurrent severe without psychotic features: Secondary | ICD-10-CM | POA: Diagnosis not present

## 2015-03-06 ENCOUNTER — Encounter (HOSPITAL_COMMUNITY): Payer: Self-pay | Admitting: Emergency Medicine

## 2015-03-06 ENCOUNTER — Emergency Department (HOSPITAL_COMMUNITY)
Admission: EM | Admit: 2015-03-06 | Discharge: 2015-03-06 | Disposition: A | Discharge status: Home / Self Care | Payer: Medicare Other | Attending: Emergency Medicine | Admitting: Emergency Medicine

## 2015-03-06 DIAGNOSIS — Z872 Personal history of diseases of the skin and subcutaneous tissue: Secondary | ICD-10-CM | POA: Diagnosis not present

## 2015-03-06 DIAGNOSIS — I10 Essential (primary) hypertension: Secondary | ICD-10-CM | POA: Insufficient documentation

## 2015-03-06 DIAGNOSIS — F410 Panic disorder [episodic paroxysmal anxiety] without agoraphobia: Secondary | ICD-10-CM | POA: Insufficient documentation

## 2015-03-06 DIAGNOSIS — Z792 Long term (current) use of antibiotics: Secondary | ICD-10-CM | POA: Insufficient documentation

## 2015-03-06 DIAGNOSIS — F319 Bipolar disorder, unspecified: Secondary | ICD-10-CM | POA: Diagnosis not present

## 2015-03-06 DIAGNOSIS — Z8719 Personal history of other diseases of the digestive system: Secondary | ICD-10-CM | POA: Diagnosis not present

## 2015-03-06 DIAGNOSIS — E669 Obesity, unspecified: Secondary | ICD-10-CM | POA: Diagnosis not present

## 2015-03-06 DIAGNOSIS — Z79899 Other long term (current) drug therapy: Secondary | ICD-10-CM | POA: Diagnosis not present

## 2015-03-06 DIAGNOSIS — Z8673 Personal history of transient ischemic attack (TIA), and cerebral infarction without residual deficits: Secondary | ICD-10-CM | POA: Diagnosis not present

## 2015-03-06 DIAGNOSIS — R21 Rash and other nonspecific skin eruption: Secondary | ICD-10-CM | POA: Diagnosis not present

## 2015-03-06 MED ORDER — HYDROXYZINE HCL 25 MG PO TABS: 25.0000 mg | ORAL_TABLET | Freq: Four times a day (QID) | ORAL | Status: DC | PRN

## 2015-03-06 MED ORDER — CLINDAMYCIN HCL 150 MG PO CAPS: 300.0000 mg | ORAL_CAPSULE | Freq: Three times a day (TID) | ORAL | Status: AC

## 2015-03-06 MED ORDER — HYDROXYZINE HCL 25 MG PO TABS
25.0000 mg | ORAL_TABLET | Freq: Once | ORAL | Status: AC
  Administered 2015-03-06: 25 mg via ORAL
  Filled 2015-03-06: qty 1

## 2015-03-06 MED ORDER — CLINDAMYCIN HCL 150 MG PO CAPS
300.0000 mg | ORAL_CAPSULE | Freq: Once | ORAL | Status: AC
  Administered 2015-03-06: 300 mg via ORAL
  Filled 2015-03-06: qty 2

## 2015-03-06 NOTE — ED Provider Notes (Signed)
CSN: [id number]     Arrival date & time 03/06/15  1817 History   First MD Initiated Contact with Patient 03/06/15 1826     Chief Complaint  Patient presents with  . Rash     (Consider location/radiation/quality/duration/timing/severity/associated sxs/prior Treatment) HPI   Patient presents with concern of ongoing facial rash. Rash began about 4 days ago, one after the patient used crack cocaine. Since onset she has had multiple raised lesions throughout her face, with no relief using rinses. Sensation is of pins sticking into her skin, itchiness. No dyspnea, dysphagia, fever, chills. No visual changes. Patient has no prior similar events. Patient acknowledges a history of depression, but specifically denies suicidal ideation.   Past Medical History  Diagnosis Date  . Chest pain, unspecified 12/2006    Normal Echo and myovue   . Dyspnea   . Nicotine addiction   . Back pain     With radiculopathy  . Depression   . Hyperlipidemia   . Hypertension   . Panic attack   . Palpitations   . Cerebrovascular disease     carotid bruit  . Obesity   . Gastroesophageal reflux disease   . Bipolar 1 disorder   . Tobacco abuse     Nicotine addiction  . Urticaria    Past Surgical History  Procedure Laterality Date  . Total abdominal hysterectomy  2002  . Cholecystectomy  2004  . Cesarean section    . Colonoscopy  05/20/2012    Procedure: COLONOSCOPY;  Surgeon: Rogene Houston, MD;  Location: [facility];  Service: Endoscopy;  Laterality: N/A;  200   Family History  Problem Relation Age of Onset  . Heart failure Mother    History  Substance Use Topics  . Smoking status: Current Every Day Smoker -- 0.50 packs/day for 30 years    Types: Cigarettes  . Smokeless tobacco: Never Used     Comment: 6 cigarettes a day since age 16  . Alcohol Use: Yes     Comment: occasionally   OB History    No data available     Review of Systems  Constitutional:       Per HPI, otherwise  negative  HENT:       Per HPI, otherwise negative  Respiratory:       Per HPI, otherwise negative  Cardiovascular:       Per HPI, otherwise negative  Gastrointestinal: Negative for vomiting.  Endocrine:       Negative aside from HPI  Genitourinary:       Neg aside from HPI   Musculoskeletal:       Per HPI, otherwise negative  Skin: Positive for wound.  Neurological: Negative for syncope.  Psychiatric/Behavioral: Positive for dysphoric mood. Negative for suicidal ideas. The patient is nervous/anxious.       Allergies  Iohexol; Vilazodone hcl; Celery oil; Gabapentin; and Methadone  Home Medications   Prior to Admission medications   Medication Sig Start Date End Date Taking? Authorizing Provider  amLODipine (NORVASC) 10 MG tablet TAKE ONE TABLET BY MOUTH EVERY DAY. 02/27/14   Fayrene Helper, MD  clindamycin (CLEOCIN) 150 MG capsule Take 2 capsules (300 mg total) by mouth 3 (three) times daily. 03/06/15 03/12/15  Carmin Muskrat, MD  clonazePAM (KLONOPIN) 0.5 MG tablet 0.5 mg 3 (three) times daily as needed for anxiety.  07/15/13   Historical Provider, MD  Eszopiclone 3 MG TABS 3 mg at bedtime as needed (depression).  07/15/13   Historical  Provider, MD  hydrOXYzine (ATARAX/VISTARIL) 25 MG tablet Take 1 tablet (25 mg total) by mouth every 6 (six) hours as needed for itching. 03/06/15   Carmin Muskrat, MD  OLANZapine-FLUoxetine (SYMBYAX) 6-25 MG per capsule 1 capsule at bedtime.  06/29/13   Historical Provider, MD  oxyCODONE-acetaminophen (PERCOCET) 10-325 MG per tablet Take 1 tablet by mouth daily as needed (take 4-5 times daily prn for pain).  11/02/13   Historical Provider, MD  traZODone (DESYREL) 100 MG tablet TAKE 2 TABLETS BY MOUTH AT BEDTIME. 09/09/13   Fayrene Helper, MD   BP 180/99 mmHg  Pulse 98  Temp(Src) 98.5 F (36.9 C) (Oral)  Resp 20  Ht 5\' 8"  (1.727 m)  Wt 195 lb (88.451 kg)  BMI 29.66 kg/m2  SpO2 99% Physical Exam  Constitutional: She is oriented to person,  place, and time. She appears well-developed and well-nourished. No distress.  HENT:  Head: Normocephalic and atraumatic.  Mouth/Throat:    Eyes: Conjunctivae and EOM are normal.  Neck: Neck supple. No tracheal tenderness present. No rigidity. No tracheal deviation, no edema, no erythema and normal range of motion present. No thyromegaly present.  Cardiovascular: Normal rate and regular rhythm.   Pulmonary/Chest: Effort normal and breath sounds normal. No stridor. No respiratory distress.  Abdominal: She exhibits no distension.  Musculoskeletal: She exhibits no edema.  Neurological: She is alert and oriented to person, place, and time. No cranial nerve deficit.  Skin: Skin is warm and dry.     Psychiatric: Thought content is not paranoid and not delusional. Cognition and memory are not impaired. She exhibits a depressed mood. She expresses no homicidal and no suicidal ideation. She expresses no suicidal plans and no homicidal plans.  Nursing note and vitals reviewed.   ED Course  Procedures (including critical care time)   MDM   Final diagnoses:  Rash   Patient presents with facial rash most consistent with folliculitis.  No e/o confluent cellulitis. Rash may be allergic, given her recent crack cocaine use, and concern for adulterant, but this would be atypical. Patient has no evidence for oropharyngeal compromise, nor bacteremia or sepsis. Patient was started recently antibiotics, provided Vistaril, discharged in stable condition to follow-up with primary care.    Carmin Muskrat, MD 03/06/15 1910

## 2015-03-06 NOTE — ED Notes (Signed)
Pt has raised rash to face.  States that it is burning and itching and feels like pins sticking in her skin.  No rash noted to any other part of body.  Pt denies new soap, lotion, or makeup.  States that she just rinses her face in clear water.  Call light in reach.  Watching tv at this time.

## 2015-03-06 NOTE — ED Notes (Addendum)
Patient complaining of "lesions" to my face x 3-4 days. Complaining of itching and "feeling like there's pins sticking all over my face." Patient's B/P was 180/99 in triage.

## 2015-03-06 NOTE — Discharge Instructions (Signed)
As discussed, your facial rash is likely a folliculitis, or inflammation and infection of the hair follicles.  Please take all medication as directed, and be sure to follow-up with your primary care physician.  Return here for concerning changes in your condition.

## 2015-05-28 ENCOUNTER — Ambulatory Visit: Payer: Medicare Other | Admitting: Family Medicine

## 2015-06-19 ENCOUNTER — Ambulatory Visit (INDEPENDENT_AMBULATORY_CARE_PROVIDER_SITE_OTHER): Payer: Medicare Other | Admitting: Family Medicine

## 2015-06-19 ENCOUNTER — Encounter: Payer: Self-pay | Admitting: Family Medicine

## 2015-06-19 VITALS — BP 160/84 | HR 92 | Resp 16 | Ht 68.0 in | Wt 230.0 lb

## 2015-06-19 DIAGNOSIS — F418 Other specified anxiety disorders: Secondary | ICD-10-CM

## 2015-06-19 DIAGNOSIS — F489 Nonpsychotic mental disorder, unspecified: Secondary | ICD-10-CM

## 2015-06-19 DIAGNOSIS — G4700 Insomnia, unspecified: Secondary | ICD-10-CM | POA: Diagnosis not present

## 2015-06-19 DIAGNOSIS — F329 Major depressive disorder, single episode, unspecified: Secondary | ICD-10-CM

## 2015-06-19 DIAGNOSIS — E559 Vitamin D deficiency, unspecified: Secondary | ICD-10-CM

## 2015-06-19 DIAGNOSIS — F5105 Insomnia due to other mental disorder: Secondary | ICD-10-CM

## 2015-06-19 DIAGNOSIS — F17210 Nicotine dependence, cigarettes, uncomplicated: Secondary | ICD-10-CM | POA: Diagnosis not present

## 2015-06-19 DIAGNOSIS — Z91199 Patient's noncompliance with other medical treatment and regimen due to unspecified reason: Secondary | ICD-10-CM

## 2015-06-19 DIAGNOSIS — E785 Hyperlipidemia, unspecified: Secondary | ICD-10-CM

## 2015-06-19 DIAGNOSIS — Z7251 High risk heterosexual behavior: Secondary | ICD-10-CM

## 2015-06-19 DIAGNOSIS — F411 Generalized anxiety disorder: Secondary | ICD-10-CM

## 2015-06-19 DIAGNOSIS — Z113 Encounter for screening for infections with a predominantly sexual mode of transmission: Secondary | ICD-10-CM

## 2015-06-19 DIAGNOSIS — E8881 Metabolic syndrome: Secondary | ICD-10-CM

## 2015-06-19 DIAGNOSIS — F419 Anxiety disorder, unspecified: Secondary | ICD-10-CM

## 2015-06-19 DIAGNOSIS — Z720 Tobacco use: Secondary | ICD-10-CM

## 2015-06-19 DIAGNOSIS — R7301 Impaired fasting glucose: Secondary | ICD-10-CM

## 2015-06-19 DIAGNOSIS — Z9119 Patient's noncompliance with other medical treatment and regimen: Secondary | ICD-10-CM

## 2015-06-19 DIAGNOSIS — F17200 Nicotine dependence, unspecified, uncomplicated: Secondary | ICD-10-CM

## 2015-06-19 DIAGNOSIS — I10 Essential (primary) hypertension: Secondary | ICD-10-CM

## 2015-06-19 DIAGNOSIS — E669 Obesity, unspecified: Secondary | ICD-10-CM

## 2015-06-19 MED ORDER — TEMAZEPAM 30 MG PO CAPS: 30.0000 mg | ORAL_CAPSULE | Freq: Every evening | ORAL | Status: DC | PRN

## 2015-06-19 MED ORDER — AMLODIPINE BESYLATE 10 MG PO TABS: 10.0000 mg | ORAL_TABLET | Freq: Every day | ORAL | Status: DC

## 2015-06-19 MED ORDER — PRAVASTATIN SODIUM 40 MG PO TABS: 40.0000 mg | ORAL_TABLET | Freq: Every day | ORAL | Status: AC

## 2015-06-19 NOTE — Patient Instructions (Signed)
Annual wellness in 2 month, call if you need me before  Three meds are sent in , amlodipine, pravachol and restoril   Please work on stopping smoking  CBC, fasting lipid cmp and EGFr, HBA1C , TSH and Vit D, HIV

## 2015-06-24 DIAGNOSIS — F5105 Insomnia due to other mental disorder: Secondary | ICD-10-CM | POA: Insufficient documentation

## 2015-06-24 DIAGNOSIS — Z91199 Patient's noncompliance with other medical treatment and regimen due to unspecified reason: Secondary | ICD-10-CM | POA: Insufficient documentation

## 2015-06-24 DIAGNOSIS — Z9119 Patient's noncompliance with other medical treatment and regimen: Secondary | ICD-10-CM | POA: Insufficient documentation

## 2015-06-24 NOTE — Assessment & Plan Note (Signed)
Established back disease, will absolutely need to be treated by pain management for pain, based on her known history of non compliance and substance abuse. No real concern re pain management at [facility] visit

## 2015-06-24 NOTE — Assessment & Plan Note (Signed)
Hyperlipidemia:Low fat diet discussed and encouraged.   Lipid Panel  Lab Results  Component Value Date   CHOL 287* 03/15/2013   HDL 48 03/15/2013   LDLCALC 193* 03/15/2013   TRIG 228* 03/15/2013   CHOLHDL 6.0 03/15/2013   Updated lab past due , needed asap

## 2015-06-24 NOTE — Assessment & Plan Note (Signed)
Pt has not been seeing any medical Provider on  regular basis for over 1 year. She has significant chronic illness requiring medication and regular follow up Cancer screening tests still needed, and currently unwilling to committing to have these done   Will refer to [facility] if she agrees, will need to have nurse call pt to see if she is willing

## 2015-06-24 NOTE — Assessment & Plan Note (Signed)
Uncontrolled, has been on no medication. Resume medication , with 2 month follow up DASH diet and commitment to daily physical activity for a minimum of 30 minutes discussed and encouraged, as a part of hypertension management. The importance of attaining a healthy weight is also discussed.  BP/Weight 06/19/2015 03/06/2015 11/10/2013 08/24/2013 05/05/2013 04/08/7470 07/05/5014  Systolic BP 868 257 493 552 174 715 953  Diastolic BP 84 99 77 80 80 87 80  Wt. (Lbs) 230 195 - 182.12 182 175 180  BMI 34.98 29.66 - 27.7 27.68 26.61 27.38  Some encounter information is confidential and restricted. Go to Review Flowsheets activity to see all data.

## 2015-06-24 NOTE — Assessment & Plan Note (Signed)
Sleep hygiene reviewed and written information offered also. Prescription sent for  medication needed. Needs to establish with psychiatry for ongoing management

## 2015-06-24 NOTE — Assessment & Plan Note (Signed)
Uncontrolled, requests xanax, however, based on pt history of non compliance , use of illicit drugs , as she reports , needs psychiatric evaluation and managment of her mental health illness

## 2015-06-24 NOTE — Progress Notes (Signed)
Susan Walker     MRN: [id number]      DOB: 09-May-1961   HPI Susan Walker is here for follow up and re-evaluation of chronic medical conditions, last seen over 1 year ago, and has been following regularly with any Provider, has been "self medicating'  Preventive health is updated, specifically  Cancer screening and Immunization.  Needs both labs and cancer screening tests, not commiting Trying to re establish, specifically for "xanax" for her "nerves"   ROS Denies recent fever or chills. Denies sinus pressure, nasal congestion, ear pain or sore throat. Denies chest congestion, productive cough or wheezing. Denies chest pains, palpitations and leg swelling Denies abdominal pain, nausea, vomiting,diarrhea or constipation.   Denies dysuria, frequency, hesitancy or incontinence. Denies headaches, seizures, numbness, or tingling.  Denies skin break down or rash.   PE  BP 160/84 mmHg  Pulse 92  Resp 16  Ht 5\' 8"  (1.727 m)  Wt 230 lb (104.327 kg)  BMI 34.98 kg/m2  SpO2 98%  Patient alert and oriented and in no cardiopulmonary distress.  HEENT: No facial asymmetry, EOMI,   oropharynx pink and moist.  Neck decreased ROM no JVD, goiter  Chest: Clear to auscultation bilaterally.decrease air entry   CVS: S1, S2 no murmurs, no S3.Regular rate.  ABD: Soft non tender.   Ext: No edema  MS: Adequate ROM spine, shoulders, hips and knees.  Skin: Intact, no ulcerations or rash noted.  Psych: Good eye contact, normal affect. Memory intact  anxious and  depressed appearing.  CNS: CN 2-12 intact, power,  normal throughout.no focal deficits noted.   Assessment & Plan   Essential hypertension Uncontrolled, has been on no medication. Resume medication , with 2 month follow up DASH diet and commitment to daily physical activity for a minimum of 30 minutes discussed and encouraged, as a part of hypertension management. The importance of attaining a healthy weight is also  discussed.  BP/Weight 06/19/2015 03/06/2015 11/10/2013 08/24/2013 05/05/2013 02/02/1936 9/0/2409  Systolic BP 735 329 924 268 341 962 229  Diastolic BP 84 99 77 80 80 87 80  Wt. (Lbs) 230 195 - 182.12 182 175 180  BMI 34.98 29.66 - 27.7 27.68 26.61 27.38  Some encounter information is confidential and restricted. Go to Review Flowsheets activity to see all data.        Hyperlipidemia Hyperlipidemia:Low fat diet discussed and encouraged.   Lipid Panel  Lab Results  Component Value Date   CHOL 287* 03/15/2013   HDL 48 03/15/2013   LDLCALC 193* 03/15/2013   TRIG 228* 03/15/2013   CHOLHDL 6.0 03/15/2013   Updated lab past due , needed asap      NICOTINE ADDICTION Patient counseled for approximately 5 minutes regarding the health risks of ongoing nicotine use, specifically all types of cancer, heart disease, stroke and respiratory failure. The options available for help with cessation ,the behavioral changes to assist the process, and the option to either gradully reduce usage  Or abruptly stop.is also discussed. Pt is also encouraged to set specific goals in number of cigarettes used daily, as well as to set a quit date.  Number of cigarettes/cigars currently smoking daily: 15 to 20   Anxiety and depression Untreated, needs to establish with psychiatry. Was last seen at [facility] in families, but missed appts so she thinks it is quite likely that she may not be accepted, will try there first. Not currently suicidal or homicidal, but states that she is aware that she is  at a low, as she has not been keeping any regular appointments for over 1 year, and admits to self medicating   Generalized anxiety disorder Uncontrolled, requests xanax, however, based on pt history of non compliance , use of illicit drugs , as she reports , needs psychiatric evaluation and managment of her mental health illness  BACK PAIN, LUMBAR, WITH RADICULOPATHY Established back disease, will absolutely need to  be treated by pain management for pain, based on her known history of non compliance and substance abuse. No real concern re pain management at thsi visit  Obesity Deteriorated. Patient re-educated about  the importance of commitment to a  minimum of 150 minutes of exercise per week.  The importance of healthy food choices with portion control discussed. Encouraged to start a food diary, count calories and to consider  joining a support group. Sample diet sheets offered. Goals set by the patient for the next several months.   Weight /BMI 06/19/2015 03/06/2015 08/24/2013  WEIGHT 230 lb 195 lb 182 lb 1.9 oz  HEIGHT 5\' 8"  5\' 8"  -  BMI 34.98 kg/m2 29.66 kg/m2 27.7 kg/m2  Some encounter information is confidential and restricted. Go to Review Flowsheets activity to see all data.    Current exercise per week 30 minutes.   Metabolic syndrome X The increased risk of cardiovascular disease associated with this diagnosis, and the need to consistently work on lifestyle to change this is discussed. Following  a  heart healthy diet ,commitment to 30 minutes of exercise at least 5 days per week, as well as control of blood sugar and cholesterol , and achieving a healthy weight are all the areas to be addressed .   Non compliance with medical treatment Pt has not been seeing any medical Provider on  regular basis for over 1 year. She has significant chronic illness requiring medication and regular follow up Cancer screening tests still needed, and currently unwilling to committing to have these done   Will refer to [facility] if she agrees, will need to have nurse call pt to see if she is willing   Insomnia due to mental disorder Sleep hygiene reviewed and written information offered also. Prescription sent for  medication needed. Needs to establish with psychiatry for ongoing management

## 2015-06-24 NOTE — Assessment & Plan Note (Signed)
Deteriorated. Patient re-educated about  the importance of commitment to a  minimum of 150 minutes of exercise per week.  The importance of healthy food choices with portion control discussed. Encouraged to start a food diary, count calories and to consider  joining a support group. Sample diet sheets offered. Goals set by the patient for the next several months.   Weight /BMI 06/19/2015 03/06/2015 08/24/2013  WEIGHT 230 lb 195 lb 182 lb 1.9 oz  HEIGHT 5\' 8"  5\' 8"  -  BMI 34.98 kg/m2 29.66 kg/m2 27.7 kg/m2  Some encounter information is confidential and restricted. Go to Review Flowsheets activity to see all data.    Current exercise per week 30 minutes.

## 2015-06-24 NOTE — Assessment & Plan Note (Signed)

## 2015-06-24 NOTE — Assessment & Plan Note (Signed)
The increased risk of cardiovascular disease associated with this diagnosis, and the need to consistently work on lifestyle to change this is discussed. Following  a  heart healthy diet ,commitment to 30 minutes of exercise at least 5 days per week, as well as control of blood sugar and cholesterol , and achieving a healthy weight are all the areas to be addressed .  

## 2015-06-24 NOTE — Assessment & Plan Note (Signed)
Untreated, needs to establish with psychiatry. Was last seen at [facility] in families, but missed appts so she thinks it is quite likely that she may not be accepted, will try there first. Not currently suicidal or homicidal, but states that she is aware that she is at a low, as she has not been keeping any regular appointments for over 1 year, and admits to self medicating

## 2015-08-20 ENCOUNTER — Ambulatory Visit: Payer: Self-pay | Admitting: Family Medicine

## 2015-08-20 ENCOUNTER — Encounter: Payer: Self-pay | Admitting: *Deleted

## 2015-08-23 ENCOUNTER — Ambulatory Visit (INDEPENDENT_AMBULATORY_CARE_PROVIDER_SITE_OTHER): Payer: Medicare Other | Admitting: Psychiatry

## 2015-08-23 ENCOUNTER — Encounter (HOSPITAL_COMMUNITY): Payer: Self-pay | Admitting: Psychiatry

## 2015-08-23 VITALS — BP 121/68 | HR 68 | Ht 68.0 in | Wt 228.4 lb

## 2015-08-23 DIAGNOSIS — F411 Generalized anxiety disorder: Secondary | ICD-10-CM | POA: Diagnosis not present

## 2015-08-23 MED ORDER — CLONAZEPAM 1 MG PO TABS: 1.0000 mg | ORAL_TABLET | Freq: Three times a day (TID) | ORAL | Status: AC

## 2015-08-23 MED ORDER — CITALOPRAM HYDROBROMIDE 20 MG PO TABS: 20.0000 mg | ORAL_TABLET | Freq: Every day | ORAL | Status: DC
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
## 2015-09-18 ENCOUNTER — Ambulatory Visit (HOSPITAL_COMMUNITY): Payer: Self-pay | Admitting: Psychiatry

## 2015-09-26 ENCOUNTER — Other Ambulatory Visit: Payer: Self-pay | Admitting: Family Medicine

## 2015-10-03 ENCOUNTER — Ambulatory Visit (HOSPITAL_COMMUNITY): Payer: Self-pay | Admitting: Psychiatry

## 2015-10-09 ENCOUNTER — Encounter (HOSPITAL_COMMUNITY): Payer: Self-pay | Admitting: *Deleted

## 2015-10-09 ENCOUNTER — Ambulatory Visit (HOSPITAL_COMMUNITY): Payer: Self-pay | Admitting: Psychiatry

## 2015-10-09 ENCOUNTER — Telehealth (HOSPITAL_COMMUNITY): Payer: Self-pay | Admitting: *Deleted

## 2015-10-09 NOTE — Telephone Encounter (Signed)
We usually give people 3 chances so she can schedule one more but if she misses it, she will be discharged

## 2015-10-09 NOTE — Telephone Encounter (Signed)
Pt called at 11am on 10-09-15 asking her if she is dropped as a patient from the practice. Pt was only seen once but keeps canceling f/u appts. Pt first appt was 08-23-15. Pt was scheduled to f/u on 10-03-15 but she cancelled and rescheduled for 10-09-15. For pt 10-09-15 appt at 9:45am, she did not call office to cancel and just never showed up for appt. Pt number is [phone].

## 2015-10-11 NOTE — Telephone Encounter (Signed)
Tried calling pt but it is giving a busy tone

## 2015-12-05 ENCOUNTER — Ambulatory Visit (HOSPITAL_COMMUNITY): Payer: Self-pay | Admitting: Psychology

## 2016-08-27 DIAGNOSIS — I10 Essential (primary) hypertension: Secondary | ICD-10-CM | POA: Diagnosis not present

## 2016-08-27 DIAGNOSIS — F17210 Nicotine dependence, cigarettes, uncomplicated: Secondary | ICD-10-CM | POA: Diagnosis not present

## 2016-08-27 DIAGNOSIS — E6609 Other obesity due to excess calories: Secondary | ICD-10-CM | POA: Diagnosis not present

## 2016-08-27 DIAGNOSIS — M797 Fibromyalgia: Secondary | ICD-10-CM | POA: Diagnosis not present

## 2016-09-23 DIAGNOSIS — F5101 Primary insomnia: Secondary | ICD-10-CM | POA: Diagnosis not present

## 2016-09-23 DIAGNOSIS — M797 Fibromyalgia: Secondary | ICD-10-CM | POA: Diagnosis not present

## 2016-09-23 DIAGNOSIS — E6609 Other obesity due to excess calories: Secondary | ICD-10-CM | POA: Diagnosis not present

## 2016-09-23 DIAGNOSIS — I10 Essential (primary) hypertension: Secondary | ICD-10-CM | POA: Diagnosis not present

## 2016-10-08 DIAGNOSIS — F17210 Nicotine dependence, cigarettes, uncomplicated: Secondary | ICD-10-CM | POA: Diagnosis not present

## 2016-10-08 DIAGNOSIS — I10 Essential (primary) hypertension: Secondary | ICD-10-CM | POA: Diagnosis not present

## 2016-10-08 DIAGNOSIS — Z23 Encounter for immunization: Secondary | ICD-10-CM | POA: Diagnosis not present

## 2016-10-08 DIAGNOSIS — F5101 Primary insomnia: Secondary | ICD-10-CM | POA: Diagnosis not present

## 2016-10-08 DIAGNOSIS — E782 Mixed hyperlipidemia: Secondary | ICD-10-CM | POA: Diagnosis not present

## 2017-04-03 DIAGNOSIS — E782 Mixed hyperlipidemia: Secondary | ICD-10-CM | POA: Diagnosis not present

## 2017-04-03 DIAGNOSIS — E6609 Other obesity due to excess calories: Secondary | ICD-10-CM | POA: Diagnosis not present

## 2017-04-03 DIAGNOSIS — I10 Essential (primary) hypertension: Secondary | ICD-10-CM | POA: Diagnosis not present

## 2017-04-03 DIAGNOSIS — M797 Fibromyalgia: Secondary | ICD-10-CM | POA: Diagnosis not present

## 2017-08-19 DIAGNOSIS — E782 Mixed hyperlipidemia: Secondary | ICD-10-CM | POA: Diagnosis not present

## 2017-08-19 DIAGNOSIS — I10 Essential (primary) hypertension: Secondary | ICD-10-CM | POA: Diagnosis not present

## 2017-08-19 DIAGNOSIS — M797 Fibromyalgia: Secondary | ICD-10-CM | POA: Diagnosis not present

## 2017-08-19 DIAGNOSIS — E6609 Other obesity due to excess calories: Secondary | ICD-10-CM | POA: Diagnosis not present

## 2018-02-01 LAB — BASIC METABOLIC PANEL: Glucose: 165

## 2018-06-08 NOTE — Congregational Nurse Program (Signed)
Congregational Nurse Program Note  Date of Encounter: 06/08/2018  Past Medical History: Past Medical History:  Diagnosis Date  . Back pain    With radiculopathy  . Bipolar 1 disorder   . Cerebrovascular disease    carotid bruit  . Chest pain, unspecified 12/2006   Normal Echo and myovue   . Depression   . Dyspnea   . Gastroesophageal reflux disease   . Hyperlipidemia   . Hypertension   . Nicotine addiction   . Obesity   . Palpitations   . Panic attack   . Tobacco abuse    Nicotine addiction  . Urticaria     Encounter Details: CNP Questionnaire - 05/19/18 2244      Questionnaire   Patient Status  Not Applicable    Race  Black or African American    Location Patient Served At  [employer];Medicare    Uninsured  Not Applicable    Food  No food insecurities    Housing/Utilities  Yes, have permanent housing    Transportation  No transportation needs    Interpersonal Safety  Yes, feel physically and emotionally safe where you currently live    Medication  No medication insecurities    Medical Provider  Yes    Referrals  Not Applicable    ED Visit Averted  Not Applicable    Life-Saving Intervention Made  Not Applicable      Patient seen at the [employer]  [facility] B/P 182/92 -P 84 Reviewed signs and symptoms of CVA,importance of taking medications as prescribed,getting exercise and eating a proper Diet.Erma Heritage RN,[facility], [phone]

## 2020-11-16 ENCOUNTER — Other Ambulatory Visit (HOSPITAL_COMMUNITY): Payer: Self-pay | Admitting: Family Medicine

## 2020-11-16 DIAGNOSIS — Z1231 Encounter for screening mammogram for malignant neoplasm of breast: Secondary | ICD-10-CM

## 2020-11-16 DIAGNOSIS — E782 Mixed hyperlipidemia: Secondary | ICD-10-CM | POA: Diagnosis not present

## 2020-11-16 DIAGNOSIS — M25512 Pain in left shoulder: Secondary | ICD-10-CM | POA: Diagnosis not present

## 2020-11-16 DIAGNOSIS — M542 Cervicalgia: Secondary | ICD-10-CM

## 2020-11-16 DIAGNOSIS — F17210 Nicotine dependence, cigarettes, uncomplicated: Secondary | ICD-10-CM | POA: Diagnosis not present

## 2021-03-11 DIAGNOSIS — E782 Mixed hyperlipidemia: Secondary | ICD-10-CM | POA: Diagnosis not present

## 2021-03-11 DIAGNOSIS — I10 Essential (primary) hypertension: Secondary | ICD-10-CM | POA: Diagnosis not present

## 2021-03-11 DIAGNOSIS — F17210 Nicotine dependence, cigarettes, uncomplicated: Secondary | ICD-10-CM | POA: Diagnosis not present

## 2021-08-02 ENCOUNTER — Emergency Department (HOSPITAL_COMMUNITY): Payer: Medicare Other

## 2021-08-02 ENCOUNTER — Other Ambulatory Visit: Payer: Self-pay

## 2021-08-02 ENCOUNTER — Emergency Department (HOSPITAL_COMMUNITY)
Admission: EM | Admit: 2021-08-02 | Discharge: 2021-08-02 | Disposition: A | Discharge status: Home / Self Care | Payer: Medicare Other | Attending: Emergency Medicine | Admitting: Emergency Medicine

## 2021-08-02 ENCOUNTER — Encounter (HOSPITAL_COMMUNITY): Payer: Self-pay

## 2021-08-02 DIAGNOSIS — F17210 Nicotine dependence, cigarettes, uncomplicated: Secondary | ICD-10-CM | POA: Diagnosis not present

## 2021-08-02 DIAGNOSIS — I10 Essential (primary) hypertension: Secondary | ICD-10-CM | POA: Insufficient documentation

## 2021-08-02 DIAGNOSIS — N2889 Other specified disorders of kidney and ureter: Secondary | ICD-10-CM | POA: Diagnosis not present

## 2021-08-02 DIAGNOSIS — K625 Hemorrhage of anus and rectum: Secondary | ICD-10-CM | POA: Diagnosis not present

## 2021-08-02 DIAGNOSIS — K449 Diaphragmatic hernia without obstruction or gangrene: Secondary | ICD-10-CM | POA: Diagnosis not present

## 2021-08-02 DIAGNOSIS — R110 Nausea: Secondary | ICD-10-CM | POA: Diagnosis not present

## 2021-08-02 DIAGNOSIS — R1032 Left lower quadrant pain: Secondary | ICD-10-CM | POA: Diagnosis not present

## 2021-08-02 LAB — COMPREHENSIVE METABOLIC PANEL
ALT: 24 U/L (ref 0–44)
AST: 17 U/L (ref 15–41)
Albumin: 4.2 g/dL (ref 3.5–5.0)
Alkaline Phosphatase: 116 U/L (ref 38–126)
Anion gap: 5 (ref 5–15)
BUN: 15 mg/dL (ref 6–20)
CO2: 25 mmol/L (ref 22–32)
Calcium: 9.3 mg/dL (ref 8.9–10.3)
Chloride: 109 mmol/L (ref 98–111)
Creatinine, Ser: 0.72 mg/dL (ref 0.44–1.00)
GFR, Estimated: >60 mL/min (ref >60)
Glucose, Bld: 88 mg/dL (ref 70–99)
Potassium: 3.6 mmol/L (ref 3.5–5.1)
Sodium: 139 mmol/L (ref 135–145)
Total Bilirubin: 0.2 mg/dL — ABNORMAL LOW (ref 0.3–1.2)
Total Protein: 7.8 g/dL (ref 6.5–8.1)

## 2021-08-02 LAB — PROTIME-INR
INR: 1 (ref 0.8–1.2)
Prothrombin Time: 12.6 seconds (ref 11.4–15.2)

## 2021-08-02 LAB — CBC WITH DIFFERENTIAL/PLATELET
Abs Immature Granulocytes: 0.02 10*3/uL (ref 0.00–0.07)
Basophils Absolute: 0.1 10*3/uL (ref 0.0–0.1)
Basophils Relative: 1 %
Eosinophils Absolute: 0.1 10*3/uL (ref 0.0–0.5)
Eosinophils Relative: 2 %
HCT: 40.5 % (ref 36.0–46.0)
Hemoglobin: 13.6 g/dL (ref 12.0–15.0)
Immature Granulocytes: 0 %
Lymphocytes Relative: 35 %
Lymphs Abs: 2.7 10*3/uL (ref 0.7–4.0)
MCH: 31.8 pg (ref 26.0–34.0)
MCHC: 33.6 g/dL (ref 30.0–36.0)
MCV: 94.6 fL (ref 80.0–100.0)
Monocytes Absolute: 0.5 10*3/uL (ref 0.1–1.0)
Monocytes Relative: 6 %
Neutro Abs: 4.4 10*3/uL (ref 1.7–7.7)
Neutrophils Relative %: 56 %
Platelets: 288 10*3/uL (ref 150–400)
RBC: 4.28 MIL/uL (ref 3.87–5.11)
RDW: 13.2 % (ref 11.5–15.5)
WBC: 7.8 10*3/uL (ref 4.0–10.5)
nRBC: 0 % (ref 0.0–0.2)

## 2021-08-02 LAB — URINALYSIS, ROUTINE W REFLEX MICROSCOPIC
Bilirubin Urine: NEGATIVE
Glucose, UA: NEGATIVE mg/dL
Hgb urine dipstick: NEGATIVE
Ketones, ur: NEGATIVE mg/dL
Leukocytes,Ua: NEGATIVE
Nitrite: NEGATIVE
Protein, ur: NEGATIVE mg/dL
Specific Gravity, Urine: >1.03 — ABNORMAL HIGH (ref 1.005–1.030)
pH: 5.5 (ref 5.0–8.0)

## 2021-08-02 LAB — TYPE AND SCREEN
ABO/RH(D): O POS
Antibody Screen: NEGATIVE

## 2021-08-02 LAB — POC OCCULT BLOOD, ED: Occult Blood: POSITIVE

## 2021-08-02 LAB — LIPASE, BLOOD: Lipase: 21 U/L (ref 11–51)

## 2021-08-02 MED ORDER — DICYCLOMINE HCL 20 MG PO TABS: 20.0000 mg | ORAL_TABLET | Freq: Three times a day (TID) | ORAL | 0 refills | Status: AC | PRN

## 2021-08-02 MED ORDER — HYDROCORTISONE ACETATE 25 MG RE SUPP: 25.0000 mg | Freq: Two times a day (BID) | RECTAL | 0 refills | Status: AC | PRN

## 2021-08-02 NOTE — ED Provider Notes (Signed)
Emergency Department Provider Note   I have reviewed the triage vital signs and the nursing notes.   HISTORY  Chief Complaint Rectal Bleeding   HPI Susan Walker is a 60 y.o. female with past medical history reviewed below presents to the emergency department with left lower quadrant abdominal pain and blood in the bowel movements.  She describes symptoms over the past several days.  She is not having fevers or chills.  She is not experiencing pain with defecation or other rectal pain.  Denies any vaginal bleeding or discharge.  She notes a prior history of hysterectomy.  She is not having burning with urination, frequency, urgency.  Pain is in the left lower quadrant and radiating to the left flank at times.  Pain is moderate and constant.  No clear provoking factors.  Denies any pain into the chest or shortness of breath.  No weakness. No prior history of diverticulitis.   Past Medical History:  Diagnosis Date   Back pain    With radiculopathy   Bipolar 1 disorder ([place])    Cerebrovascular disease    carotid bruit   Chest pain, unspecified 12/2006   Normal Echo and myovue    Depression    Dyspnea    Gastroesophageal reflux disease    Hyperlipidemia    Hypertension    Nicotine addiction    Obesity    Palpitations    Panic attack    Tobacco abuse    Nicotine addiction   Urticaria     Patient Active Problem List   Diagnosis Date Noted   Non compliance with medical treatment 06/24/2015   Insomnia due to mental disorder 06/24/2015   Generalized anxiety disorder [id number]   Metabolic syndrome X [id number]   Panic disorder 12/29/2011   BACK PAIN, LUMBAR, WITH RADICULOPATHY 11/11/2010   CAROTID BRUIT 01/17/2010   GOITER, UNSPECIFIED 11/20/2009   Hyperlipidemia 03/08/2008   Obesity 03/08/2008   NICOTINE ADDICTION 03/08/2008   Anxiety and depression 03/08/2008   Essential hypertension 03/08/2008    Past Surgical History:  Procedure Laterality Date   CESAREAN  SECTION     CHOLECYSTECTOMY  2004   COLONOSCOPY  05/20/2012   Procedure: COLONOSCOPY;  Surgeon: Rogene Houston, MD;  [facility];  Service: Endoscopy;  Laterality: N/A;  200   TOTAL ABDOMINAL HYSTERECTOMY  2002    Allergies Iohexol, Vilazodone hcl, Celery oil, Gabapentin, and Methadone  Family History  Problem Relation Age of Onset   Heart failure Mother    Alcohol abuse Mother    Alcohol abuse Maternal Aunt    Alcohol abuse Maternal Uncle    Anxiety disorder Maternal Grandmother    Depression Maternal Grandmother     Social History Social History   Tobacco Use   Smoking status: Every Day    Packs/day: 0.50    Years: 30.00    Pack years: 15.00    Types: Cigarettes   Smokeless tobacco: Never   Tobacco comments:    6 cigarettes a day since age 45  Substance Use Topics   Alcohol use: Yes    Alcohol/week: 0.0 standard drinks    Comment: occasionally (08-23-15 per pt she is not drinking)   Drug use: Not Currently    Types: Cocaine, "Crack" cocaine    Comment: "smoked crack about 5 days ago" (08-23-15 per pt, she stopped doing Crack Cocaine 1 month ago)    Review of Systems  Constitutional: No fever/chills Eyes: No visual changes. ENT: No sore throat.  Cardiovascular: Denies chest pain. Respiratory: Denies shortness of breath. Gastrointestinal: Positive LLQ abdominal pain.  No nausea, no vomiting.  No diarrhea.  No constipation. Genitourinary: Negative for dysuria. Musculoskeletal: Negative for back pain. Skin: Negative for rash. Neurological: Negative for headaches, focal weakness or numbness.  10-point ROS otherwise negative.  ____________________________________________   PHYSICAL EXAM:  VITAL SIGNS: ED Triage Vitals [08/02/21 1411]  Enc Vitals Group     BP (!) 189/95     Pulse Rate 91     Resp 16     Temp 98.5 F (36.9 C)     Temp Source Oral     SpO2 98 %     Weight 265 lb (120.2 kg)     Height '5\' 8"'$  (1.727 m)   Constitutional: Alert  and oriented. Well appearing and in no acute distress. Ambulatory.  Eyes: Conjunctivae are normal.  Head: Atraumatic. Nose: No congestion/rhinnorhea. Mouth/Throat: Mucous membranes are moist.  Neck: No stridor. Cardiovascular: Normal rate, regular rhythm. Good peripheral circulation. Grossly normal heart sounds.   Respiratory: Normal respiratory effort.  No retractions. Lungs CTAB. Gastrointestinal: Soft with mild left upper and left lower abdominal tenderness. No rebound or guarding. No distention.  Musculoskeletal: No lower extremity tenderness nor edema. No gross deformities of extremities. Neurologic:  Normal speech and language. No gross focal neurologic deficits are appreciated.  Skin:  Skin is warm, dry and intact. No rash noted.   ____________________________________________   LABS (all labs ordered are listed, but only abnormal results are displayed)  Labs Reviewed  COMPREHENSIVE METABOLIC PANEL - Abnormal; Notable for the following components:      Result Value   Total Bilirubin 0.2 (*)    All other components within normal limits  URINALYSIS, ROUTINE W REFLEX MICROSCOPIC - Abnormal; Notable for the following components:   Specific Gravity, Urine >1.030 (*)    All other components within normal limits  LIPASE, BLOOD  CBC WITH DIFFERENTIAL/PLATELET  PROTIME-INR  POC OCCULT BLOOD, ED  TYPE AND SCREEN   ____________________________________________  RADIOLOGY  CT ABDOMEN PELVIS WO CONTRAST  Result Date: 08/02/2021 CLINICAL DATA:  Left lower quadrant pain common nausea and rectal bleeding x a couple of days. EXAM: CT ABDOMEN AND PELVIS WITHOUT CONTRAST TECHNIQUE: Multidetector CT imaging of the abdomen and pelvis was performed following the standard protocol without IV contrast. COMPARISON:  CT Apr 23, 2012 FINDINGS: Evaluation of the upper/mid abdomen is degraded by respiratory motion. Lower chest: No acute abnormality. Hepatobiliary: Unremarkable noncontrast appearance of  the hepatic parenchyma. Gallbladder surgically absent. No biliary ductal dilation. Pancreas: Evaluation is limited by respiratory motion, no pancreatic ductal dilation. Spleen: Within normal limits. Adrenals/Urinary Tract: Bilateral adrenal glands are unremarkable. No hydronephrosis. No renal, ureteral or bladder calculi visualized. Masslike partially exophytic contour abnormality in the upper pole left kidney measuring approximately 3.4 cm best appreciated on coronal image 73, evaluation of which is limited by respiratory motion and lack of intravenous contrast material. Urinary bladder is decompressed limiting evaluation. Stomach/Bowel: Tiny hiatal hernia otherwise the stomach is unremarkable for degree of distension. No pathologic dilation of small or large bowel. The appendix and terminal ileum are unremarkable. No colonic diverticular disease. No evidence of acute bowel inflammation. Vascular/Lymphatic: Aortic and branch vessel atherosclerosis without aneurysmal dilation. No pathologically enlarged abdominal or pelvic lymph nodes. Reproductive: Status post hysterectomy. No adnexal masses. Other: No abdominopelvic ascites.  No pneumoperitoneum. Musculoskeletal: Lower lumbar predominant degenerative changes spine. Degenerative changes bilateral hips and SI joints. No acute osseous abnormality. IMPRESSION: 1. No  acute findings in the abdomen or pelvis. Specifically no evidence of bowel inflammation or diverticular disease. 2. Masslike partially exophytic contour abnormality involving the upper pole left kidney measuring approximately 3.4 cm, evaluation of which is limited by respiratory motion and lack of intravenous contrast material. This is a nonspecific finding and may reflect nodular renal contour accentuated by respiratory motio. However, an underlying renal neoplasm can not be excluded. Consider further evaluation with nonemergent renal protocol CT or MRI if clinically indicated. 3.  Aortic Atherosclerosis  (ICD10-I70.0). Electronically Signed   By: Dahlia Bailiff M.D.   On: 08/02/2021 16:07    ____________________________________________   PROCEDURES  Procedure(s) performed:   Procedures  None  ____________________________________________   INITIAL IMPRESSION / ASSESSMENT AND PLAN / ED COURSE  Pertinent labs & imaging results that were available during my care of the patient were reviewed by me and considered in my medical decision making (see chart for details).   Patient presents to the emergency department with left lower quadrant abdominal pain with report of blood in the bowel movement.  Mild tenderness on exam without peritoneal findings.  Doubt ovarian torsion or vaginal bleeding source.  Patient with history of hysterectomy.  Diverticulitis is a consideration.  Vital signs are within normal limits.  Plan for screening blood work along with CT imaging without contrast initially patient does have a contrast dye allergy listed as anaphylaxis. Kidney stone considered as well. Doubt vascular pathology such as dissection/AAA.   04:30 PM  Patient's Hemoccult is faintly positive.  There is no gross blood or melena.  Patient does have a small hemorrhoid on exam.  Exam was done with a nurse chaperone and with patient's verbal consent.  Plan for some Anusol suppositories and Bentyl as needed for pain.  Patient will discuss the left renal mass, found on her CT, with her PCP who will arrange outpatient imaging for further characterization.  Also provided contact information for GI. Discussed strict ED return precautions.  ____________________________________________  FINAL CLINICAL IMPRESSION(S) / ED DIAGNOSES  Final diagnoses:  Rectal bleeding  Left renal mass     NEW OUTPATIENT MEDICATIONS STARTED DURING THIS VISIT:  New Prescriptions   DICYCLOMINE (BENTYL) 20 MG TABLET    Take 1 tablet (20 mg total) by mouth 3 (three) times daily as needed for spasms (abdominal cramping).    HYDROCORTISONE (ANUSOL-HC) 25 MG SUPPOSITORY    Place 1 suppository (25 mg total) rectally 2 (two) times daily as needed for hemorrhoids or anal itching.    Note:  This document was prepared using Dragon voice recognition software and may include unintentional dictation errors.  Nanda Quinton, MD, [facility]    Myra Weng, [facility], MD 08/02/21 1640

## 2021-08-02 NOTE — Discharge Instructions (Addendum)
You were seen in the emergency room today with bleeding and some abdominal pain.  Your CT scan did not show a reason for your bleeding but did uncover a mass on the left kidney.  The radiologist is recommending outpatient imaging such as MRI for further evaluation of this area.  Your primary care doctor can order this for you.  Please call their office to schedule a follow-up appointment to discuss your symptoms and arrange further imaging.  I have also listed the name of a gastroenterologist.  I think with your bleeding it would be wise to see a gastroenterologist for further evaluation.  Your insurance may require a referral from your primary care doctor as well.  I have called into medicines to use as directed.  Return with any new or suddenly worsening symptoms.

## 2021-08-02 NOTE — ED Triage Notes (Signed)
Pt presents to ED with complaints of rectal bleeding x couple of days. Pt states she has had left sided abdominal pain x 2 days. Pt denies N/V/D

## 2021-08-09 DIAGNOSIS — K625 Hemorrhage of anus and rectum: Secondary | ICD-10-CM | POA: Diagnosis not present

## 2021-08-09 DIAGNOSIS — R935 Abnormal findings on diagnostic imaging of other abdominal regions, including retroperitoneum: Secondary | ICD-10-CM | POA: Diagnosis not present

## 2021-08-09 DIAGNOSIS — Z7189 Other specified counseling: Secondary | ICD-10-CM | POA: Diagnosis not present

## 2021-08-09 DIAGNOSIS — R1032 Left lower quadrant pain: Secondary | ICD-10-CM | POA: Diagnosis not present

## 2021-08-12 ENCOUNTER — Encounter (INDEPENDENT_AMBULATORY_CARE_PROVIDER_SITE_OTHER): Payer: Self-pay | Admitting: *Deleted

## 2021-08-12 ENCOUNTER — Other Ambulatory Visit: Payer: Self-pay | Admitting: Family Medicine

## 2021-08-12 DIAGNOSIS — R1032 Left lower quadrant pain: Secondary | ICD-10-CM

## 2021-08-26 ENCOUNTER — Ambulatory Visit (HOSPITAL_COMMUNITY): Admission: RE | Admit: 2021-08-26 | Payer: Medicare Other | Source: Ambulatory Visit

## 2021-08-26 ENCOUNTER — Encounter (HOSPITAL_COMMUNITY): Payer: Self-pay

## 2021-08-26 ENCOUNTER — Other Ambulatory Visit (HOSPITAL_COMMUNITY): Payer: Self-pay | Admitting: Family Medicine

## 2021-08-26 DIAGNOSIS — R1032 Left lower quadrant pain: Secondary | ICD-10-CM

## 2021-09-05 ENCOUNTER — Ambulatory Visit (HOSPITAL_COMMUNITY): Admission: RE | Admit: 2021-09-05 | Payer: Medicare Other | Source: Ambulatory Visit

## 2021-09-05 ENCOUNTER — Encounter (HOSPITAL_COMMUNITY): Payer: Self-pay

## 2021-12-23 ENCOUNTER — Ambulatory Visit (INDEPENDENT_AMBULATORY_CARE_PROVIDER_SITE_OTHER): Payer: Medicaid Other | Admitting: Gastroenterology

## 2022-04-22 ENCOUNTER — Encounter (INDEPENDENT_AMBULATORY_CARE_PROVIDER_SITE_OTHER): Payer: Self-pay | Admitting: *Deleted

## 2023-09-22 ENCOUNTER — Ambulatory Visit (INDEPENDENT_AMBULATORY_CARE_PROVIDER_SITE_OTHER): Payer: 59 | Admitting: Internal Medicine

## 2023-09-22 ENCOUNTER — Encounter: Payer: Self-pay | Admitting: Internal Medicine

## 2023-09-22 VITALS — BP 144/88 | HR 81 | Ht 68.0 in | Wt 220.4 lb

## 2023-09-22 DIAGNOSIS — I10 Essential (primary) hypertension: Secondary | ICD-10-CM

## 2023-09-22 DIAGNOSIS — F411 Generalized anxiety disorder: Secondary | ICD-10-CM

## 2023-09-22 DIAGNOSIS — M5416 Radiculopathy, lumbar region: Secondary | ICD-10-CM

## 2023-09-22 DIAGNOSIS — F410 Panic disorder [episodic paroxysmal anxiety] without agoraphobia: Secondary | ICD-10-CM | POA: Diagnosis not present

## 2023-09-22 DIAGNOSIS — R739 Hyperglycemia, unspecified: Secondary | ICD-10-CM

## 2023-09-22 DIAGNOSIS — Z114 Encounter for screening for human immunodeficiency virus [HIV]: Secondary | ICD-10-CM

## 2023-09-22 DIAGNOSIS — E782 Mixed hyperlipidemia: Secondary | ICD-10-CM

## 2023-09-22 DIAGNOSIS — J449 Chronic obstructive pulmonary disease, unspecified: Secondary | ICD-10-CM | POA: Diagnosis not present

## 2023-09-22 DIAGNOSIS — Z1159 Encounter for screening for other viral diseases: Secondary | ICD-10-CM

## 2023-09-22 DIAGNOSIS — K219 Gastro-esophageal reflux disease without esophagitis: Secondary | ICD-10-CM

## 2023-09-22 DIAGNOSIS — Z720 Tobacco use: Secondary | ICD-10-CM

## 2023-09-22 DIAGNOSIS — F331 Major depressive disorder, recurrent, moderate: Secondary | ICD-10-CM

## 2023-09-22 DIAGNOSIS — F17210 Nicotine dependence, cigarettes, uncomplicated: Secondary | ICD-10-CM | POA: Diagnosis not present

## 2023-09-22 MED ORDER — ALBUTEROL SULFATE HFA 108 (90 BASE) MCG/ACT IN AERS: 2.0000 | INHALATION_SPRAY | Freq: Four times a day (QID) | RESPIRATORY_TRACT | 4 refills | Status: AC | PRN

## 2023-09-22 MED ORDER — ESOMEPRAZOLE MAGNESIUM 40 MG PO CPDR: 40.0000 mg | DELAYED_RELEASE_CAPSULE | Freq: Every day | ORAL | 1 refills | Status: DC

## 2023-09-22 MED ORDER — SPIRIVA RESPIMAT 2.5 MCG/ACT IN AERS: 2.0000 | INHALATION_SPRAY | Freq: Every day | RESPIRATORY_TRACT | 5 refills | Status: AC

## 2023-09-22 MED ORDER — LORAZEPAM 1 MG PO TABS: 1.0000 mg | ORAL_TABLET | Freq: Two times a day (BID) | ORAL | 0 refills | Status: DC

## 2023-09-22 MED ORDER — AMLODIPINE BESYLATE 10 MG PO TABS: 10.0000 mg | ORAL_TABLET | Freq: Every day | ORAL | 1 refills | Status: DC

## 2023-09-22 NOTE — Assessment & Plan Note (Signed)
BP Readings from Last 1 Encounters:  09/22/23 (!) 144/88   Uncontrolled Restart amlodipine 10 mg QD Counseled for compliance with the medications Advised DASH diet and moderate exercise/walking, at least 150 mins/week

## 2023-09-22 NOTE — Assessment & Plan Note (Signed)
Uncontrolled Has history of medication noncompliance in the past, used to take Celexa for MDD and GAD Plan to restart Celexa in the next visit, she prefers not to start it currently

## 2023-09-22 NOTE — Progress Notes (Signed)
New Patient Office Visit  Subjective:  Patient ID: Susan Walker, female    DOB: [birth year]  Age: 62 y.o. MRN: [id number]  CC:  Chief Complaint  Patient presents with   Establish Care    HPI Susan Walker is a 62 y.o. female with past medical history of HTN, GAD, HLD, GERD, COPD and tobacco abuse who presents for establishing care.  HTN: Her BP was elevated today.  She has run out of her amlodipine 10 mg.  Reports generalized headache, which is intermittent.  She denies any chest pain, or palpitations.   COPD: She has chronic cough and intermittent dyspnea due to COPD.  She has run out of her Spiriva and as needed albuterol inhalers as well.  She still smokes 1 pack/day.  HLD: She was placed on pravastatin by previous PCP, but was having severe myalgias and has stopped taking it.    GERD: She takes Nexium 40 mg as needed for acid reflux.  Denies any nausea or vomiting currently.  GAD and MDD: She appears anxious today.  She used to take Ativan 1 mg 3 times daily, but has run out of it about 2 weeks ago as her previous PCP retired in 08/24.  She used to take Celexa in the past for MDD as well.  She has seen psychiatrists in the past.  She has polysubstance abuse history, but denies any use of cocaine for the last 4 months.  She admits that she took hydrocodone from her friend for severe back pain 2 days ago.  Chronic low back pain: She has history of lumbar radiculopathy and has been on multiple pain medicines in the past, including oral opioids and fentanyl patch.  She was given Lyrica 150 mg 3 times daily by previous PCP, but she has stopped taking it as it was not helping her pain.  She agrees for pain management referral.  She denies any cancer screening or vaccines.   Past Medical History:  Diagnosis Date   Back pain    With radiculopathy   Bipolar 1 disorder (HCC)    Cerebrovascular disease    carotid bruit   Chest pain, unspecified 12/2006   Normal Echo and myovue     Depression    Dyspnea    Gastroesophageal reflux disease    Hyperlipidemia    Hypertension    Nicotine addiction    Obesity    Palpitations    Panic attack    Tobacco abuse    Nicotine addiction   Urticaria     Past Surgical History:  Procedure Laterality Date   CESAREAN SECTION     CHOLECYSTECTOMY  2004   COLONOSCOPY  05/20/2012   Procedure: COLONOSCOPY;  Surgeon: Malissa Hippo, MD;  Location: [facility];  Service: Endoscopy;  Laterality: N/A;  200   TOTAL ABDOMINAL HYSTERECTOMY  2002    Family History  Problem Relation Age of Onset   Heart failure Mother    Alcohol abuse Mother    Alcohol abuse Maternal Aunt    Alcohol abuse Maternal Uncle    Anxiety disorder Maternal Grandmother    Depression Maternal Grandmother     Social History   Socioeconomic History   Marital status: Single    Spouse name: Not on file   Number of children: 1   Years of education: Not on file   Highest education level: Not on file  Occupational History   Occupation: disable   Tobacco Use   Smoking status: Every  Day    Current packs/day: 0.50    Average packs/day: 0.5 packs/day for 30.0 years (15.0 ttl pk-yrs)    Types: Cigarettes   Smokeless tobacco: Never   Tobacco comments:    6 cigarettes a day since age 31  Substance and Sexual Activity   Alcohol use: Yes    Alcohol/week: 0.0 standard drinks of alcohol    Comment: occasionally (08-23-15 per pt she is not drinking)   Drug use: Not Currently    Types: Cocaine, "Crack" cocaine    Comment: "smoked crack about 5 days ago" (08-23-15 per pt, she stopped doing Crack Cocaine 1 month ago)   Sexual activity: Not Currently  Other Topics Concern   Not on file  Social History Narrative   Not on file   Social Determinants of Health   Financial Resource Strain: Not on file  Food Insecurity: Not on file  Transportation Needs: Not on file  Physical Activity: Not on file  Stress: Not on file  Social Connections: Not on file  Intimate  Partner Violence: Not on file    ROS Review of Systems  Constitutional:  Positive for fatigue. Negative for chills and fever.  HENT:  Negative for congestion, sinus pressure, sinus pain and sore throat.   Eyes:  Negative for pain and discharge.  Respiratory:  Positive for cough and shortness of breath.   Cardiovascular:  Negative for chest pain and palpitations.  Gastrointestinal:  Negative for abdominal pain, diarrhea, nausea and vomiting.  Endocrine: Negative for polydipsia and polyuria.  Genitourinary:  Negative for dysuria and hematuria.  Musculoskeletal:  Positive for back pain. Negative for neck pain and neck stiffness.  Skin:  Negative for rash.  Neurological:  Positive for weakness (B/l LE). Negative for dizziness.  Psychiatric/Behavioral:  Positive for decreased concentration, dysphoric mood and sleep disturbance. Negative for agitation and behavioral problems. The patient is nervous/anxious.     Objective:   Today's Vitals: BP (!) 144/88 (BP Location: Left Arm)   Pulse 81   Ht 5\' 8"  (1.727 m)   Wt 220 lb 6.4 oz (100 kg)   SpO2 94%   BMI 33.51 kg/m   Physical Exam Vitals reviewed.  Constitutional:      General: She is not in acute distress.    Appearance: She is not diaphoretic.  HENT:     Head: Normocephalic and atraumatic.     Nose: Nose normal.     Mouth/Throat:     Mouth: Mucous membranes are moist.  Eyes:     General: No scleral icterus.    Extraocular Movements: Extraocular movements intact.  Cardiovascular:     Rate and Rhythm: Normal rate and regular rhythm.     Heart sounds: Normal heart sounds. No murmur heard. Pulmonary:     Breath sounds: Normal breath sounds. No wheezing or rales.  Musculoskeletal:     Cervical back: Neck supple. No tenderness.     Right lower leg: No edema.     Left lower leg: No edema.  Skin:    General: Skin is warm.     Findings: No rash.  Neurological:     General: No focal deficit present.     Mental Status: She is  alert and oriented to person, place, and time.  Psychiatric:        Mood and Affect: Mood is anxious. Affect is labile.        Behavior: Behavior is agitated.     Assessment & Plan:   Problem List Items  Addressed This Visit       Cardiovascular and Mediastinum   Essential hypertension - Primary    BP Readings from Last 1 Encounters:  09/22/23 (!) 144/88   Uncontrolled Restart amlodipine 10 mg QD Counseled for compliance with the medications Advised DASH diet and moderate exercise/walking, at least 150 mins/week       Relevant Medications   amLODipine (NORVASC) 10 MG tablet   Other Relevant Orders   CBC with Differential/Platelet   CMP14+EGFR     Respiratory   Chronic obstructive pulmonary disease (HCC)    Uncontrolled Restart Spiriva and and as needed albuterol Needs to cut down -> quit smoking      Relevant Medications   Tiotropium Bromide Monohydrate (SPIRIVA RESPIMAT) 2.5 MCG/ACT AERS   albuterol (VENTOLIN HFA) 108 (90 Base) MCG/ACT inhaler     Digestive   Gastroesophageal reflux disease    Overall well controlled with Nexium 40 mg QD      Relevant Medications   esomeprazole (NEXIUM) 40 MG capsule     Nervous and Auditory   Lumbar radiculopathy    Has chronic low back pain with LE weakness Used to take oral opioids and also has tried fentanyl patch Has stopped taking Lyrica as it was not helping Referred to [facility] for pain management      Relevant Medications   LORazepam (ATIVAN) 1 MG tablet     Other   Panic disorder (Chronic)    Used to see psychiatry in the past Has run out of Ativan 1 mg 3 times daily, was managed by PCP Advised to start Ativan 1 mg twice daily for now Can increase dose if she has persistent anxiety Strictly advised to avoid any substance abuse including opioid medication that are not prescription medicine for her      Relevant Medications   LORazepam (ATIVAN) 1 MG tablet   Hyperlipidemia    Has tried  pravastatin, but had myalgias Check lipid profile      Relevant Medications   amLODipine (NORVASC) 10 MG tablet   Other Relevant Orders   Lipid Profile   Tobacco abuse    Smokes about 1 pack/day  Asked about quitting: confirms that he/she currently smokes cigarettes Advise to quit smoking: Educated about QUITTING to reduce the risk of cancer, cardio and cerebrovascular disease. Assess willingness: Unwilling to quit at this time, but is working on cutting back. Assist with counseling and pharmacotherapy: Counseled for 5 minutes and literature provided. Arrange for follow up: follow up in 3 months and continue to offer help.      Generalized anxiety disorder    Uncontrolled as she she has run out of her Ativan Used to take Celexa in the past, plan to restart in the next visit Started Ativan 1 mg twice daily for now      Relevant Medications   LORazepam (ATIVAN) 1 MG tablet   Moderate episode of recurrent major depressive disorder (HCC)    Uncontrolled Has history of medication noncompliance in the past, used to take Celexa for MDD and GAD Plan to restart Celexa in the next visit, she prefers not to start it currently      Relevant Medications   LORazepam (ATIVAN) 1 MG tablet   Other Visit Diagnoses     Need for hepatitis C screening test       Relevant Orders   Hepatitis C Antibody   Encounter for screening for HIV       Relevant Orders  HIV antibody (with reflex)   Hyperglycemia       Relevant Orders   Hemoglobin A1c       Outpatient Encounter Medications as of 09/22/2023  Medication Sig   Tiotropium Bromide Monohydrate (SPIRIVA RESPIMAT) 2.5 MCG/ACT AERS Inhale 2 puffs into the lungs daily.   [DISCONTINUED] albuterol (VENTOLIN HFA) 108 (90 Base) MCG/ACT inhaler Inhale into the lungs.   [DISCONTINUED] amLODipine (NORVASC) 10 MG tablet TAKE ONE TABLET BY MOUTH DAILY.   [DISCONTINUED] esomeprazole (NEXIUM) 40 MG capsule Take 40 mg by mouth daily.   [DISCONTINUED]  LORazepam (ATIVAN) 1 MG tablet Take 1 mg by mouth 3 (three) times daily.   [DISCONTINUED] SPIRIVA RESPIMAT 1.25 MCG/ACT AERS SMARTSIG:2 Puff(s) Via Inhaler Daily   albuterol (VENTOLIN HFA) 108 (90 Base) MCG/ACT inhaler Inhale 2 puffs into the lungs every 6 (six) hours as needed for wheezing or shortness of breath.   amLODipine (NORVASC) 10 MG tablet Take 1 tablet (10 mg total) by mouth daily.   dicyclomine (BENTYL) 20 MG tablet Take 1 tablet (20 mg total) by mouth 3 (three) times daily as needed for spasms (abdominal cramping). (Patient not taking: Reported on 09/22/2023)   esomeprazole (NEXIUM) 40 MG capsule Take 1 capsule (40 mg total) by mouth daily.   hydrocortisone (ANUSOL-HC) 25 MG suppository Place 1 suppository (25 mg total) rectally 2 (two) times daily as needed for hemorrhoids or anal itching. (Patient not taking: Reported on 09/22/2023)   LORazepam (ATIVAN) 1 MG tablet Take 1 tablet (1 mg total) by mouth 2 (two) times daily.   pravastatin (PRAVACHOL) 40 MG tablet Take 1 tablet (40 mg total) by mouth daily. (Patient not taking: Reported on 09/22/2023)   [DISCONTINUED] pregabalin (LYRICA) 150 MG capsule Take 150 mg by mouth 3 (three) times daily. (Patient not taking: Reported on 09/22/2023)   No facility-administered encounter medications on file as of 09/22/2023.    Follow-up: Return in about 4 weeks (around 10/20/2023) for HTN and GAD.   Anabel Halon, MD

## 2023-09-22 NOTE — Assessment & Plan Note (Signed)
Overall well controlled with Nexium 40 mg QD

## 2023-09-22 NOTE — Assessment & Plan Note (Signed)
Uncontrolled as she she has run out of her Ativan Used to take Celexa in the past, plan to restart in the next visit Started Ativan 1 mg twice daily for now

## 2023-09-22 NOTE — Assessment & Plan Note (Signed)
Smokes about 1 pack/day  Asked about quitting: confirms that he/she currently smokes cigarettes Advise to quit smoking: Educated about QUITTING to reduce the risk of cancer, cardio and cerebrovascular disease. Assess willingness: Unwilling to quit at this time, but is working on cutting back. Assist with counseling and pharmacotherapy: Counseled for 5 minutes and literature provided. Arrange for follow up: follow up in 3 months and continue to offer help. 

## 2023-09-22 NOTE — Assessment & Plan Note (Signed)
Has chronic low back pain with LE weakness Used to take oral opioids and also has tried fentanyl patch Has stopped taking Lyrica as it was not helping Referred to [facility] for pain management

## 2023-09-22 NOTE — Assessment & Plan Note (Signed)
Uncontrolled Restart Spiriva and and as needed albuterol Needs to cut down -> quit smoking

## 2023-09-22 NOTE — Patient Instructions (Addendum)
Please start taking Amlodipine as prescribed.  Please take Ativan only twice daily as needed for anxiety. Please do not take Ativan more than prescribed in a day.  Please follow low salt diet and ambulate as tolerated.  You are being referred to [facility].

## 2023-09-22 NOTE — Assessment & Plan Note (Signed)
Used to see psychiatry in the past Has run out of Ativan 1 mg 3 times daily, was managed by PCP Advised to start Ativan 1 mg twice daily for now Can increase dose if she has persistent anxiety Strictly advised to avoid any substance abuse including opioid medication that are not prescription medicine for her

## 2023-09-22 NOTE — Assessment & Plan Note (Signed)
Has tried pravastatin, but had myalgias Check lipid profile

## 2023-09-23 LAB — CBC WITH DIFFERENTIAL/PLATELET
Basophils Absolute: 0.1 10*3/uL (ref 0.0–0.2)
Basos: 1 %
EOS (ABSOLUTE): 0.1 10*3/uL (ref 0.0–0.4)
Eos: 1 %
Hematocrit: 41.5 % (ref 34.0–46.6)
Hemoglobin: 13.7 g/dL (ref 11.1–15.9)
Immature Grans (Abs): 0 10*3/uL (ref 0.0–0.1)
Immature Granulocytes: 0 %
Lymphocytes Absolute: 2.2 10*3/uL (ref 0.7–3.1)
Lymphs: 30 %
MCH: 31.1 pg (ref 26.6–33.0)
MCHC: 33 g/dL (ref 31.5–35.7)
MCV: 94 fL (ref 79–97)
Monocytes Absolute: 0.4 10*3/uL (ref 0.1–0.9)
Monocytes: 6 %
Neutrophils Absolute: 4.5 10*3/uL (ref 1.4–7.0)
Neutrophils: 62 %
Platelets: 322 10*3/uL (ref 150–450)
RBC: 4.4 x10E6/uL (ref 3.77–5.28)
RDW: 12.2 % (ref 11.7–15.4)
WBC: 7.4 10*3/uL (ref 3.4–10.8)

## 2023-09-23 LAB — CMP14+EGFR
ALT: 13 [IU]/L (ref 0–32)
AST: 15 [IU]/L (ref 0–40)
Albumin: 4.3 g/dL (ref 3.9–4.9)
Alkaline Phosphatase: 151 [IU]/L — ABNORMAL HIGH (ref 44–121)
BUN/Creatinine Ratio: 14 (ref 12–28)
BUN: 12 mg/dL (ref 8–27)
Bilirubin Total: <0.2 mg/dL (ref 0.0–1.2)
CO2: 24 mmol/L (ref 20–29)
Calcium: 9.4 mg/dL (ref 8.7–10.3)
Chloride: 107 mmol/L — ABNORMAL HIGH (ref 96–106)
Creatinine, Ser: 0.83 mg/dL (ref 0.57–1.00)
Globulin, Total: 3.3 g/dL (ref 1.5–4.5)
Glucose: 94 mg/dL (ref 70–99)
Potassium: 4.5 mmol/L (ref 3.5–5.2)
Sodium: 144 mmol/L (ref 134–144)
Total Protein: 7.6 g/dL (ref 6.0–8.5)
eGFR: 80 mL/min/{1.73_m2} (ref >59)

## 2023-09-23 LAB — LIPID PANEL
Chol/HDL Ratio: 4.6 ratio — ABNORMAL HIGH (ref 0.0–4.4)
Cholesterol, Total: 236 mg/dL — ABNORMAL HIGH (ref 100–199)
HDL: 51 mg/dL (ref >39)
LDL Chol Calc (NIH): 157 mg/dL — ABNORMAL HIGH (ref 0–99)
Triglycerides: 153 mg/dL — ABNORMAL HIGH (ref 0–149)
VLDL Cholesterol Cal: 28 mg/dL (ref 5–40)

## 2023-09-23 LAB — HEPATITIS C ANTIBODY: Hep C Virus Ab: NONREACTIVE

## 2023-09-23 LAB — HEMOGLOBIN A1C
Est. average glucose Bld gHb Est-mCnc: 114 mg/dL
Hgb A1c MFr Bld: 5.6 % (ref 4.8–5.6)

## 2023-09-23 LAB — HIV ANTIBODY (ROUTINE TESTING W REFLEX): HIV Screen 4th Generation wRfx: NONREACTIVE

## 2023-10-22 ENCOUNTER — Ambulatory Visit (INDEPENDENT_AMBULATORY_CARE_PROVIDER_SITE_OTHER): Payer: 59 | Admitting: Internal Medicine

## 2023-10-22 ENCOUNTER — Encounter: Payer: Self-pay | Admitting: Internal Medicine

## 2023-10-22 VITALS — BP 178/74 | HR 93 | Ht 67.0 in | Wt 222.0 lb

## 2023-10-22 DIAGNOSIS — N62 Hypertrophy of breast: Secondary | ICD-10-CM

## 2023-10-22 DIAGNOSIS — J449 Chronic obstructive pulmonary disease, unspecified: Secondary | ICD-10-CM

## 2023-10-22 DIAGNOSIS — Z79899 Other long term (current) drug therapy: Secondary | ICD-10-CM

## 2023-10-22 DIAGNOSIS — M5416 Radiculopathy, lumbar region: Secondary | ICD-10-CM

## 2023-10-22 DIAGNOSIS — I160 Hypertensive urgency: Secondary | ICD-10-CM | POA: Diagnosis not present

## 2023-10-22 DIAGNOSIS — Z1231 Encounter for screening mammogram for malignant neoplasm of breast: Secondary | ICD-10-CM

## 2023-10-22 DIAGNOSIS — F410 Panic disorder [episodic paroxysmal anxiety] without agoraphobia: Secondary | ICD-10-CM | POA: Diagnosis not present

## 2023-10-22 DIAGNOSIS — Z720 Tobacco use: Secondary | ICD-10-CM | POA: Diagnosis not present

## 2023-10-22 DIAGNOSIS — E782 Mixed hyperlipidemia: Secondary | ICD-10-CM

## 2023-10-22 DIAGNOSIS — K219 Gastro-esophageal reflux disease without esophagitis: Secondary | ICD-10-CM

## 2023-10-22 MED ORDER — LORAZEPAM 1 MG PO TABS
1.0000 mg | ORAL_TABLET | Freq: Three times a day (TID) | ORAL | 1 refills | Status: DC | PRN
Start: 2023-10-22 — End: 2023-12-10

## 2023-10-22 MED ORDER — TELMISARTAN 20 MG PO TABS: 20.0000 mg | ORAL_TABLET | Freq: Every day | ORAL | 0 refills | Status: DC

## 2023-10-22 NOTE — Assessment & Plan Note (Addendum)
BP Readings from Last 1 Encounters:  10/22/23 (!) 178/74   Uncontrolled with amlodipine 10 mg once daily, but noncompliant Added telmisartan 20 mg once daily Counseled for compliance with the medications Advised DASH diet and moderate exercise/walking, at least 150 mins/week

## 2023-10-22 NOTE — Assessment & Plan Note (Signed)
Well-controlled with Spiriva and and as needed albuterol Needs to cut down -> quit smoking

## 2023-10-22 NOTE — Progress Notes (Addendum)
Established Patient Office Visit  Subjective:  Patient ID: Susan Walker, female    DOB: [birth year]  Age: 62 y.o. MRN: [id number]  CC:  Chief Complaint  Patient presents with   Hypertension    Four week follow up    Anxiety    Four week follow up     HPI Susan Walker is a 62 y.o. female with past medical history of HTN, GAD, HLD, GERD, COPD and tobacco abuse who presents for f/u of her chronic medical conditions.  HTN: Her BP was significantly elevated today.  She has been taking amlodipine 10 mg on intermittent basis.  Reports generalized headache, which is intermittent.  She denies any chest pain, or palpitations.    COPD: She has chronic cough and intermittent dyspnea due to COPD.  She has started using Spiriva and as needed albuterol inhalers as well, which has improved her symptoms.  She still smokes 1 pack/day.   HLD: She was placed on pravastatin by previous PCP, but was having severe myalgias and has stopped taking it.     GERD: She takes Nexium 40 mg as needed for acid reflux.  Denies any nausea or vomiting currently.   GAD and MDD: She appears anxious today.  She used to take Ativan 1 mg 3 times daily, but had restarted as BID in the last visit.  She used to take Celexa in the past for MDD as well.  She has seen psychiatrists in the past.  She has polysubstance abuse history, but denies any use of cocaine for the last 4 months.  She admits that she took hydrocodone from her friend for severe back pain 2 days ago.  Chronic low back pain: She has history of lumbar radiculopathy and has been on multiple pain medicines in the past, including oral opioids and fentanyl patch.  She was given Lyrica 150 mg 3 times daily by previous PCP, but she has stopped taking it as it was not helping her pain.  She agrees for pain management referral. She was referred to [facility] pain clinic, but has not scheduled visit yet.   She has current concern of macromastia.  She has upper back  pain and attributes it to macromastia.  Denies any bleeding or breast pain or discharge.  Past Medical History:  Diagnosis Date   Back pain    With radiculopathy   Bipolar 1 disorder (HCC)    Cerebrovascular disease    carotid bruit   Chest pain, unspecified 12/2006   Normal Echo and myovue    Depression    Dyspnea    Gastroesophageal reflux disease    Hyperlipidemia    Hypertension    Nicotine addiction    Obesity    Palpitations    Panic attack    Tobacco abuse    Nicotine addiction   Urticaria     Past Surgical History:  Procedure Laterality Date   CESAREAN SECTION     CHOLECYSTECTOMY  2004   COLONOSCOPY  05/20/2012   Procedure: COLONOSCOPY;  Surgeon: Malissa Hippo, MD;  Location: [facility];  Service: Endoscopy;  Laterality: N/A;  200   TOTAL ABDOMINAL HYSTERECTOMY  2002    Family History  Problem Relation Age of Onset   Heart failure Mother    Alcohol abuse Mother    Alcohol abuse Maternal Aunt    Alcohol abuse Maternal Uncle    Anxiety disorder Maternal Grandmother    Depression Maternal Grandmother     Social  History   Socioeconomic History   Marital status: Single    Spouse name: Not on file   Number of children: 1   Years of education: Not on file   Highest education level: Not on file  Occupational History   Occupation: disable   Tobacco Use   Smoking status: Every Day    Current packs/day: 0.50    Average packs/day: 0.5 packs/day for 30.0 years (15.0 ttl pk-yrs)    Types: Cigarettes   Smokeless tobacco: Never   Tobacco comments:    6 cigarettes a day since age 62  Substance and Sexual Activity   Alcohol use: Yes    Alcohol/week: 0.0 standard drinks of alcohol    Comment: occasionally (08-23-15 per pt she is not drinking)   Drug use: Not Currently    Types: Cocaine, "Crack" cocaine    Comment: "smoked crack about 5 days ago" (08-23-15 per pt, she stopped doing Crack Cocaine 1 month ago)   Sexual activity: Not Currently  Other Topics  Concern   Not on file  Social History Narrative   Not on file   Social Determinants of Health   Financial Resource Strain: Not on file  Food Insecurity: Not on file  Transportation Needs: Not on file  Physical Activity: Not on file  Stress: Not on file  Social Connections: Not on file  Intimate Partner Violence: Not on file    Outpatient Medications Prior to Visit  Medication Sig Dispense Refill   albuterol (VENTOLIN HFA) 108 (90 Base) MCG/ACT inhaler Inhale 2 puffs into the lungs every 6 (six) hours as needed for wheezing or shortness of breath. 18 g 4   amLODipine (NORVASC) 10 MG tablet Take 1 tablet (10 mg total) by mouth daily. 90 tablet 1   dicyclomine (BENTYL) 20 MG tablet Take 1 tablet (20 mg total) by mouth 3 (three) times daily as needed for spasms (abdominal cramping). (Patient not taking: Reported on 09/22/2023) 20 tablet 0   esomeprazole (NEXIUM) 40 MG capsule Take 1 capsule (40 mg total) by mouth daily. 90 capsule 1   hydrocortisone (ANUSOL-HC) 25 MG suppository Place 1 suppository (25 mg total) rectally 2 (two) times daily as needed for hemorrhoids or anal itching. (Patient not taking: Reported on 09/22/2023) 12 suppository 0   pravastatin (PRAVACHOL) 40 MG tablet Take 1 tablet (40 mg total) by mouth daily. (Patient not taking: Reported on 09/22/2023) 30 tablet 3   Tiotropium Bromide Monohydrate (SPIRIVA RESPIMAT) 2.5 MCG/ACT AERS Inhale 2 puffs into the lungs daily. 4 g 5   LORazepam (ATIVAN) 1 MG tablet Take 1 tablet (1 mg total) by mouth 2 (two) times daily. 60 tablet 0   No facility-administered medications prior to visit.    Allergies  Allergen Reactions   Iohexol Anaphylaxis        Vilazodone Hcl Shortness Of Breath and Itching   Celery Oil Hives and Itching   Gabapentin Hives, Nausea And Vomiting and Swelling   Methadone Swelling    ROS Review of Systems  Constitutional:  Positive for fatigue. Negative for chills and fever.  HENT:  Negative for  congestion, sinus pressure, sinus pain and sore throat.   Eyes:  Negative for pain and discharge.  Respiratory:  Positive for cough and shortness of breath.   Cardiovascular:  Negative for chest pain and palpitations.  Gastrointestinal:  Negative for abdominal pain, diarrhea, nausea and vomiting.  Endocrine: Negative for polydipsia and polyuria.  Genitourinary:  Negative for dysuria and hematuria.  Musculoskeletal:  Positive for back pain. Negative for neck pain and neck stiffness.  Skin:  Negative for rash.  Neurological:  Positive for weakness (B/l LE). Negative for dizziness.  Psychiatric/Behavioral:  Positive for decreased concentration, dysphoric mood and sleep disturbance. Negative for agitation and behavioral problems. The patient is nervous/anxious.       Objective:    Physical Exam Vitals reviewed.  Constitutional:      General: She is not in acute distress.    Appearance: She is not diaphoretic.  HENT:     Head: Normocephalic and atraumatic.     Nose: Nose normal.     Mouth/Throat:     Mouth: Mucous membranes are moist.  Eyes:     General: No scleral icterus.    Extraocular Movements: Extraocular movements intact.  Cardiovascular:     Rate and Rhythm: Normal rate and regular rhythm.     Heart sounds: Normal heart sounds. No murmur heard. Pulmonary:     Breath sounds: Normal breath sounds. No wheezing or rales.  Musculoskeletal:     Cervical back: Neck supple. No tenderness.     Right lower leg: No edema.     Left lower leg: No edema.  Skin:    General: Skin is warm.     Findings: No rash.  Neurological:     General: No focal deficit present.     Mental Status: She is alert and oriented to person, place, and time.  Psychiatric:        Mood and Affect: Mood is anxious. Affect is labile.        Behavior: Behavior is agitated.     BP (!) 178/74 (BP Location: Left Arm)   Pulse 93   Ht 5\' 7"  (1.702 m)   Wt 222 lb (100.7 kg)   SpO2 94%   BMI 34.77 kg/m  Wt  Readings from Last 3 Encounters:  10/22/23 222 lb (100.7 kg)  09/22/23 220 lb 6.4 oz (100 kg)  08/02/21 265 lb (120.2 kg)    Lab Results  Component Value Date   TSH 0.283 (L) 11/10/2013   Lab Results  Component Value Date   WBC 7.4 09/22/2023   HGB 13.7 09/22/2023   HCT 41.5 09/22/2023   MCV 94 09/22/2023   PLT 322 09/22/2023   Lab Results  Component Value Date   NA 144 09/22/2023   K 4.5 09/22/2023   CO2 24 09/22/2023   GLUCOSE 94 09/22/2023   BUN 12 09/22/2023   CREATININE 0.83 09/22/2023   BILITOT <0.2 09/22/2023   ALKPHOS 151 (H) 09/22/2023   AST 15 09/22/2023   ALT 13 09/22/2023   PROT 7.6 09/22/2023   ALBUMIN 4.3 09/22/2023   CALCIUM 9.4 09/22/2023   ANIONGAP 5 08/02/2021   EGFR 80 09/22/2023   Lab Results  Component Value Date   CHOL 236 (H) 09/22/2023   Lab Results  Component Value Date   HDL 51 09/22/2023   Lab Results  Component Value Date   LDLCALC 157 (H) 09/22/2023   Lab Results  Component Value Date   TRIG 153 (H) 09/22/2023   Lab Results  Component Value Date   CHOLHDL 4.6 (H) 09/22/2023   Lab Results  Component Value Date   HGBA1C 5.6 09/22/2023      Assessment & Plan:   Problem List Items Addressed This Visit       Cardiovascular and Mediastinum   Hypertensive urgency - Primary    BP Readings from Last 1 Encounters:  10/22/23 Marland Kitchen)  178/74   Uncontrolled with amlodipine 10 mg once daily, but noncompliant Added telmisartan 20 mg once daily Counseled for compliance with the medications Advised DASH diet and moderate exercise/walking, at least 150 mins/week       Relevant Medications   telmisartan (MICARDIS) 20 MG tablet     Respiratory   Chronic obstructive pulmonary disease (HCC)    Well-controlled with Spiriva and and as needed albuterol Needs to cut down -> quit smoking        Digestive   Gastroesophageal reflux disease    Overall well controlled with Nexium 40 mg QD        Nervous and Auditory   Lumbar  radiculopathy    Has chronic low back pain with LE weakness Used to take oral opioids and also has tried fentanyl patch Has stopped taking Lyrica as it was not helping Referred to [facility] for pain management -referral sent again      Relevant Medications   LORazepam (ATIVAN) 1 MG tablet   citalopram (CELEXA) 10 MG tablet   Other Relevant Orders   Ambulatory referral to Pain Clinic     Other   Panic disorder (Chronic)    Used to see psychiatry in the past Was on Ativan 1 mg 3 times daily, was managed by previous PCP Uncontrolled with Ativan 1 mg twice daily -increased dose of Ativan to 1 mg 3 times daily Added Celexa 10 mg QD Strictly advised to avoid any substance abuse including opioid medication that are not prescription medicine for her      Relevant Medications   LORazepam (ATIVAN) 1 MG tablet   citalopram (CELEXA) 10 MG tablet   Hyperlipidemia    Has tried pravastatin, but had myalgias Checked lipid profile - but she denies to take any other statins as she has had myalgias      Relevant Medications   telmisartan (MICARDIS) 20 MG tablet   Tobacco abuse    Smokes about 1 pack/day  Asked about quitting: confirms that he/she currently smokes cigarettes Advise to quit smoking: Educated about QUITTING to reduce the risk of cancer, cardio and cerebrovascular disease. Assess willingness: Unwilling to quit at this time, but is working on cutting back. Assist with counseling and pharmacotherapy: Counseled for 5 minutes and literature provided. Arrange for follow up: follow up in 3 months and continue to offer help.      Macromastia    Her upper back pain could be due to macromastia She asks for plastic surgery referral for breast reduction surgery -referral provided Needs Mammography - ordered      Relevant Orders   Ambulatory referral to Plastic Surgery   Other Visit Diagnoses     Chronic prescription benzodiazepine use       Relevant Orders    ToxASSURE Select 13 (MW), Urine (Completed)   Encounter for screening mammogram for malignant neoplasm of breast       Relevant Orders   MM 3D SCREENING MAMMOGRAM BILATERAL BREAST       Meds ordered this encounter  Medications   LORazepam (ATIVAN) 1 MG tablet    Sig: Take 1 tablet (1 mg total) by mouth every 8 (eight) hours as needed for anxiety.    Dispense:  90 tablet    Refill:  1   telmisartan (MICARDIS) 20 MG tablet    Sig: Take 1 tablet (20 mg total) by mouth daily.    Dispense:  30 tablet    Refill:  0  citalopram (CELEXA) 10 MG tablet    Sig: Take 1 tablet (10 mg total) by mouth daily.    Dispense:  30 tablet    Refill:  3    Follow-up: Return in about 3 weeks (around 11/12/2023) for HTN.    Anabel Halon, MD

## 2023-10-22 NOTE — Assessment & Plan Note (Addendum)
Used to see psychiatry in the past Was on Ativan 1 mg 3 times daily, was managed by previous PCP Uncontrolled with Ativan 1 mg twice daily -increased dose of Ativan to 1 mg 3 times daily Added Celexa 10 mg QD Strictly advised to avoid any substance abuse including opioid medication that are not prescription medicine for her

## 2023-10-22 NOTE — Patient Instructions (Signed)
Please start taking Telmisartan and Amlodipine for blood pressure.  Please start taking Celexa as prescribed for anxiety.  Please start taking Ativan 3 times daily as prescribed.  Please continue to take medications as prescribed.  Please continue to follow low salt diet and ambulate as tolerated.

## 2023-10-22 NOTE — Assessment & Plan Note (Addendum)
Has chronic low back pain with LE weakness Used to take oral opioids and also has tried fentanyl patch Has stopped taking Lyrica as it was not helping Referred to [facility] for pain management -referral sent again

## 2023-10-22 NOTE — Assessment & Plan Note (Signed)
Has tried pravastatin, but had myalgias Checked lipid profile - but she denies to take any other statins as she has had myalgias

## 2023-10-22 NOTE — Assessment & Plan Note (Signed)
Smokes about 1 pack/day  Asked about quitting: confirms that he/she currently smokes cigarettes Advise to quit smoking: Educated about QUITTING to reduce the risk of cancer, cardio and cerebrovascular disease. Assess willingness: Unwilling to quit at this time, but is working on cutting back. Assist with counseling and pharmacotherapy: Counseled for 5 minutes and literature provided. Arrange for follow up: follow up in 3 months and continue to offer help. 

## 2023-10-22 NOTE — Assessment & Plan Note (Signed)
Overall well controlled with Nexium 40 mg QD

## 2023-10-22 NOTE — Assessment & Plan Note (Addendum)
Her upper back pain could be due to macromastia She asks for plastic surgery referral for breast reduction surgery -referral provided Needs Mammography - ordered

## 2023-10-26 ENCOUNTER — Telehealth: Payer: Self-pay

## 2023-10-26 LAB — TOXASSURE SELECT 13 (MW), URINE

## 2023-10-26 MED ORDER — CITALOPRAM HYDROBROMIDE 10 MG PO TABS: 10.0000 mg | ORAL_TABLET | Freq: Every day | ORAL | 3 refills | Status: DC

## 2023-10-26 NOTE — Telephone Encounter (Signed)
Copied from CRM [id number]. Topic: Clinical - Medication Refill >> Oct 26, 2023  3:01 PM Susan Walker wrote: Most Recent Primary Care Visit:  Provider: Anabel Halon  Department: [facility]  Visit Type: OFFICE VISIT  Date: 10/22/2023  Medication: Celexa an anxiety meds, pt does not know the dosage. Pt is waiting for the rx to be filled, pharmacy does not have record of med.   Has the patient contacted their pharmacy? Yes (Agent: If no, request that the patient contact the pharmacy for the refill. If patient does not wish to contact the pharmacy document the reason why and proceed with request.) (Agent: If yes, when and what did the pharmacy advise?)  Is this the correct pharmacy for this prescription? Yes If no, delete pharmacy and type the correct one.  This is the patient's preferred pharmacy:  [facility] - [place], [place] - [address] [place] [address] Phone: [phone] Fax: [phone]   Has the prescription been filled recently? No  Is the patient out of the medication?  Has the patient been seen for an appointment in the last year OR does the patient have an upcoming appointment?   Can we respond through MyChart? No, pls Walker/b at [phone] or [phone]  Agent: Please be advised that Rx refills may take up to 3 business days. We ask that you follow-up with your pharmacy.

## 2023-10-26 NOTE — Addendum Note (Signed)
Addended byTrena Platt on: 10/26/2023 04:51 PM   Modules accepted: Orders

## 2023-10-27 NOTE — Telephone Encounter (Signed)
Patient advised.

## 2023-12-01 ENCOUNTER — Ambulatory Visit: Payer: 59 | Admitting: Internal Medicine

## 2023-12-09 ENCOUNTER — Encounter: Payer: Self-pay | Admitting: Internal Medicine

## 2023-12-10 ENCOUNTER — Other Ambulatory Visit: Payer: Self-pay | Admitting: Internal Medicine

## 2023-12-10 DIAGNOSIS — F410 Panic disorder [episodic paroxysmal anxiety] without agoraphobia: Secondary | ICD-10-CM

## 2024-01-18 ENCOUNTER — Other Ambulatory Visit: Payer: Self-pay | Admitting: Internal Medicine

## 2024-01-18 DIAGNOSIS — F410 Panic disorder [episodic paroxysmal anxiety] without agoraphobia: Secondary | ICD-10-CM

## 2024-01-18 NOTE — Telephone Encounter (Signed)
Copied from CRM [id number]. Topic: Clinical - Medication Refill >> Jan 18, 2024  9:08 AM Geroge Baseman wrote: Most Recent Primary Care Visit:  Provider: Anabel Halon  Department: [facility]  Visit Type: OFFICE VISIT  Date: 10/22/2023  Medication: LORazepam (ATIVAN) 1 MG tablet  Has the patient contacted their pharmacy? Yes (Agent: If no, request that the patient contact the pharmacy for the refill. If patient does not wish to contact the pharmacy document the reason why and proceed with request.) (Agent: If yes, when and what did the pharmacy advise?)  Is this the correct pharmacy for this prescription? Yes If no, delete pharmacy and type the correct one.  This is the patient's preferred pharmacy:  [facility] - [place], [place] - [address] [place] [address] Phone: [phone] Fax: [phone]   Has the prescription been filled recently? No  Is the patient out of the medication? No, a few left  Has the patient been seen for an appointment in the last year OR does the patient have an upcoming appointment? Yes  Can we respond through MyChart? Yes  Agent: Please be advised that Rx refills may take up to 3 business days. We ask that you follow-up with your pharmacy.

## 2024-02-16 ENCOUNTER — Other Ambulatory Visit: Payer: Self-pay | Admitting: Internal Medicine

## 2024-02-16 DIAGNOSIS — F410 Panic disorder [episodic paroxysmal anxiety] without agoraphobia: Secondary | ICD-10-CM

## 2024-02-16 NOTE — Telephone Encounter (Signed)
 Copied from CRM [id number]. Topic: Clinical - Medication Refill >> Feb 16, 2024 10:44 AM Carlatta H wrote: Most Recent Primary Care Visit:  Provider: Anabel Halon  Department: [facility]  Visit Type: OFFICE VISIT  Date: 10/22/2023  Medication: LORazepam (ATIVAN) 1 MG tablet [[id number]]  Has the patient contacted their pharmacy? No (Agent: If no, request that the patient contact the pharmacy for the refill. If patient does not wish to contact the pharmacy document the reason why and proceed with request.) (Agent: If yes, when and what did the pharmacy advise?)  Is this the correct pharmacy for this prescription? Yes If no, delete pharmacy and type the correct one.  This is the patient's preferred pharmacy:  [facility] - [place], [place] - [address] [place] [address] Phone: [phone] Fax: [phone]   Has the prescription been filled recently? No  Is the patient out of the medication? Yes  Has the patient been seen for an appointment in the last year OR does the patient have an upcoming appointment? Yes  Can we respond through MyChart? No  Agent: Please be advised that Rx refills may take up to 3 business days. We ask that you follow-up with your pharmacy.

## 2024-02-25 ENCOUNTER — Other Ambulatory Visit: Payer: Self-pay | Admitting: Internal Medicine

## 2024-02-25 DIAGNOSIS — I10 Essential (primary) hypertension: Secondary | ICD-10-CM

## 2024-03-02 ENCOUNTER — Other Ambulatory Visit: Payer: Self-pay | Admitting: Internal Medicine

## 2024-03-02 DIAGNOSIS — F410 Panic disorder [episodic paroxysmal anxiety] without agoraphobia: Secondary | ICD-10-CM

## 2024-03-04 ENCOUNTER — Ambulatory Visit: Payer: Self-pay | Admitting: Internal Medicine

## 2024-03-10 ENCOUNTER — Encounter: Payer: Self-pay | Admitting: Internal Medicine

## 2024-03-20 ENCOUNTER — Other Ambulatory Visit: Payer: Self-pay | Admitting: Internal Medicine

## 2024-03-20 DIAGNOSIS — K219 Gastro-esophageal reflux disease without esophagitis: Secondary | ICD-10-CM

## 2024-04-13 ENCOUNTER — Other Ambulatory Visit: Payer: Self-pay | Admitting: Internal Medicine

## 2024-04-13 DIAGNOSIS — F410 Panic disorder [episodic paroxysmal anxiety] without agoraphobia: Secondary | ICD-10-CM

## 2024-04-13 NOTE — Telephone Encounter (Signed)
 Copied from CRM [id number]. Topic: Clinical - Medication Refill >> Apr 13, 2024  9:22 AM Carlatta H wrote: Medication: LORazepam  (ATIVAN ) 1 MG tablet [[id number]]  Has the patient contacted their pharmacy? No (Agent: If no, request that the patient contact the pharmacy for the refill. If patient does not wish to contact the pharmacy document the reason why and proceed with request.) (Agent: If yes, when and what did the pharmacy advise?)  This is the patient's preferred pharmacy:  [facility] - [place], [place] - [address] [place] [address] Phone: [phone] Fax: [phone]  Is this the correct pharmacy for this prescription? Yes If no, delete pharmacy and type the correct one.   Has the prescription been filled recently? No  Is the patient out of the medication? Yes  Has the patient been seen for an appointment in the last year OR does the patient have an upcoming appointment? Yes  Can we respond through MyChart? No  Agent: Please be advised that Rx refills may take up to 3 business days. We ask that you follow-up with your pharmacy.

## 2024-05-04 ENCOUNTER — Other Ambulatory Visit: Payer: Self-pay | Admitting: Internal Medicine

## 2024-05-04 DIAGNOSIS — F410 Panic disorder [episodic paroxysmal anxiety] without agoraphobia: Secondary | ICD-10-CM

## 2024-05-11 ENCOUNTER — Other Ambulatory Visit: Payer: Self-pay | Admitting: Internal Medicine

## 2024-05-11 DIAGNOSIS — F410 Panic disorder [episodic paroxysmal anxiety] without agoraphobia: Secondary | ICD-10-CM

## 2024-05-11 NOTE — Telephone Encounter (Signed)
 Copied from CRM [id number]. Topic: Clinical - Medication Refill >> May 11, 2024 10:08 AM Santiya F wrote: Medication: LORazepam  (ATIVAN ) 1 MG tablet [[id number]]  Has the patient contacted their pharmacy? Yes  (Agent: If yes, when and what did the pharmacy advise?) Contact office   This is the patient's preferred pharmacy:  [facility] - [place], [place] - [address] [place] [address] Phone: [phone] Fax: [phone]  Is this the correct pharmacy for this prescription? Yes If no, delete pharmacy and type the correct one.   Has the prescription been filled recently? Yes  Is the patient out of the medication? No, has 2 left   Has the patient been seen for an appointment in the last year OR does the patient have an upcoming appointment? Yes  Can we respond through MyChart? No  Agent: Please be advised that Rx refills may take up to 3 business days. We ask that you follow-up with your pharmacy.

## 2024-05-18 ENCOUNTER — Telehealth: Payer: Self-pay

## 2024-05-18 ENCOUNTER — Ambulatory Visit (INDEPENDENT_AMBULATORY_CARE_PROVIDER_SITE_OTHER): Payer: Self-pay | Admitting: Internal Medicine

## 2024-05-18 ENCOUNTER — Encounter: Payer: Self-pay | Admitting: Internal Medicine

## 2024-05-18 VITALS — BP 130/72 | HR 85 | Ht 67.0 in | Wt 186.4 lb

## 2024-05-18 DIAGNOSIS — Z79899 Other long term (current) drug therapy: Secondary | ICD-10-CM

## 2024-05-18 DIAGNOSIS — I10 Essential (primary) hypertension: Secondary | ICD-10-CM | POA: Diagnosis not present

## 2024-05-18 DIAGNOSIS — F410 Panic disorder [episodic paroxysmal anxiety] without agoraphobia: Secondary | ICD-10-CM

## 2024-05-18 DIAGNOSIS — Z720 Tobacco use: Secondary | ICD-10-CM | POA: Diagnosis not present

## 2024-05-18 DIAGNOSIS — E782 Mixed hyperlipidemia: Secondary | ICD-10-CM | POA: Diagnosis not present

## 2024-05-18 DIAGNOSIS — F411 Generalized anxiety disorder: Secondary | ICD-10-CM

## 2024-05-18 DIAGNOSIS — M5416 Radiculopathy, lumbar region: Secondary | ICD-10-CM | POA: Diagnosis not present

## 2024-05-18 DIAGNOSIS — F331 Major depressive disorder, recurrent, moderate: Secondary | ICD-10-CM

## 2024-05-18 DIAGNOSIS — J449 Chronic obstructive pulmonary disease, unspecified: Secondary | ICD-10-CM | POA: Diagnosis not present

## 2024-05-18 DIAGNOSIS — N760 Acute vaginitis: Secondary | ICD-10-CM | POA: Diagnosis not present

## 2024-05-18 MED ORDER — CITALOPRAM HYDROBROMIDE 10 MG PO TABS: 10.0000 mg | ORAL_TABLET | Freq: Every day | ORAL | 5 refills | Status: AC

## 2024-05-18 MED ORDER — FLUCONAZOLE 150 MG PO TABS: 150.0000 mg | ORAL_TABLET | Freq: Once | ORAL | 0 refills | Status: AC

## 2024-05-18 MED ORDER — AMLODIPINE-OLMESARTAN 10-20 MG PO TABS: 1.0000 | ORAL_TABLET | Freq: Every day | ORAL | 1 refills | Status: AC

## 2024-05-18 NOTE — Assessment & Plan Note (Signed)
 Overall well-controlled Has history of medication noncompliance in the past On Celexa  10 mg QD

## 2024-05-18 NOTE — Assessment & Plan Note (Signed)
Smokes about 0.5 pack/day  Asked about quitting: confirms that he/she currently smokes cigarettes Advise to quit smoking: Educated about QUITTING to reduce the risk of cancer, cardio and cerebrovascular disease. Assess willingness: Unwilling to quit at this time, but is working on cutting back. Assist with counseling and pharmacotherapy: Counseled for 5 minutes and literature provided. Arrange for follow up: follow up in 3 months and continue to offer help. 

## 2024-05-18 NOTE — Assessment & Plan Note (Signed)
 Used to see psychiatry in the past Was on Ativan  1 mg 3 times daily, was managed by previous PCP Was uncontrolled with Ativan  1 mg twice daily -increased dose of Ativan  to 1 mg 3 times daily On Celexa  10 mg QD Strictly advised to avoid any substance abuse including opioid medication that are not prescription medicine for her

## 2024-05-18 NOTE — Assessment & Plan Note (Signed)
 Has chronic low back pain with LE weakness Used to take oral opioids and also has tried fentanyl  patch Has stopped taking Lyrica as it was not helping Referred to [facility] for pain management, but did not follow up

## 2024-05-18 NOTE — Patient Instructions (Addendum)
 Please schedule Mammography.  Please start taking Amlodipine -Olmesartan 10-20 mg once daily instead of Amlodipine .  Please take Fluconazole  for perineal itching.
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
## 2024-05-18 NOTE — Assessment & Plan Note (Signed)
 Likely due to candidal vaginitis, sent fluconazole  Check vaginal swab

## 2024-05-18 NOTE — Assessment & Plan Note (Signed)
 Well-controlled with Spiriva and and as needed albuterol Needs to cut down -> quit smoking

## 2024-05-18 NOTE — Telephone Encounter (Signed)
 Results are not back , labs were done this morning.

## 2024-05-18 NOTE — Telephone Encounter (Signed)
 Copied from CRM [id number]. Topic: Clinical - Lab/Test Results >> May 18, 2024  2:49 PM Lizabeth Riggs wrote: Reason for CRM:  Mitzy called and was checking on her lab results from today. Please call her at [phone] to discuss results. Thanks

## 2024-05-18 NOTE — Assessment & Plan Note (Addendum)
 BP Readings from Last 1 Encounters:  05/18/24 130/72   Uncontrolled with amlodipine  10 mg once daily now DC telmisartan  20 mg once daily as she has not been taking it Switched to Azor 10-20 mg once daily instead of amlodipine  Counseled for compliance with the medications Advised DASH diet and moderate exercise/walking, at least 150 mins/week

## 2024-05-18 NOTE — Progress Notes (Signed)
 Established Patient Office Visit  Subjective:  Patient ID: Susan Walker, female    DOB: [birth year]  Age: 63 y.o. MRN: [id number]  CC:  Chief Complaint  Patient presents with   Medical Management of Chronic Issues    Follow up   Vaginal Itching    Pt reports sx of vaginal itching. Ongoing for 7-8 days.     HPI Susan Walker is a 63 y.o. female with past medical history of HTN, GAD, HLD, GERD, COPD and tobacco abuse who presents for f/u of her chronic medical conditions.  HTN: Her BP was elevated today, but better than prior.  She has been taking amlodipine  10 mg once daily, but did not take Telmisartan .  Reports generalized headache, which is intermittent.  She denies any chest pain, or palpitations.  COPD: She has chronic cough and intermittent dyspnea due to COPD.  She has started using Spiriva  and as needed albuterol  inhalers as well, which has improved her symptoms.  She still smokes 1 pack/day.   HLD: She was placed on pravastatin  by previous PCP, but was having severe myalgias and has stopped taking it.     GERD: She takes Nexium  40 mg as needed for acid reflux.  Denies any nausea or vomiting currently.   GAD and MDD: She appears anxious today.  She takes Ativan  1 mg 3 times daily.  She takes Celexa  for MDD as well.  She has seen psychiatrists in the past.  She has polysubstance abuse history, but denies any use of cocaine  for the last 4 months.  She admits that she took hydrocodone from her friend for severe back pain 1 week ago.  Chronic low back pain: She has history of lumbar radiculopathy and has been on multiple pain medicines in the past, including oral opioids and fentanyl  patch.  She was given Lyrica 150 mg 3 times daily by previous PCP, but she has stopped taking it as it was not helping her pain.  She agreed for pain management referral. She was referred to [facility] pain clinic, but has not scheduled visit yet.  She reports vaginal area itching for the last 1  week.  Denies any vaginal discharge currently.  Denies dysuria, hematuria or urinary hesitancy or resistance.  Past Medical History:  Diagnosis Date   Back pain    With radiculopathy   Bipolar 1 disorder (HCC)    Cerebrovascular disease    carotid bruit   Chest pain, unspecified 12/2006   Normal Echo and myovue    Depression    Dyspnea    Gastroesophageal reflux disease    Hyperlipidemia    Hypertension    Nicotine  addiction    Obesity    Palpitations    Panic attack    Tobacco abuse    Nicotine  addiction   Urticaria     Past Surgical History:  Procedure Laterality Date   CESAREAN SECTION     CHOLECYSTECTOMY  2004   COLONOSCOPY  05/20/2012   Procedure: COLONOSCOPY;  Surgeon: Ruby Corporal, MD;  Location: [facility];  Service: Endoscopy;  Laterality: N/A;  200   TOTAL ABDOMINAL HYSTERECTOMY  2002    Family History  Problem Relation Age of Onset   Heart failure Mother    Alcohol abuse Mother    Alcohol abuse Maternal Aunt    Alcohol abuse Maternal Uncle    Anxiety disorder Maternal Grandmother    Depression Maternal Grandmother     Social History   Socioeconomic History  Marital status: Single    Spouse name: Not on file   Number of children: 1   Years of education: Not on file   Highest education level: Not on file  Occupational History   Occupation: disable   Tobacco Use   Smoking status: Every Day    Current packs/day: 0.50    Average packs/day: 0.5 packs/day for 30.0 years (15.0 ttl pk-yrs)    Types: Cigarettes   Smokeless tobacco: Never   Tobacco comments:    6 cigarettes a day since age 78  Substance and Sexual Activity   Alcohol use: Yes    Alcohol/week: 0.0 standard drinks of alcohol    Comment: occasionally (08-23-15 per pt she is not drinking)   Drug use: Not Currently    Types: Cocaine , Crack cocaine     Comment: smoked crack about 5 days ago (08-23-15 per pt, she stopped doing Crack Cocaine  1 month ago)   Sexual activity: Not  Currently  Other Topics Concern   Not on file  Social History Narrative   Not on file   Social Drivers of Health   Financial Resource Strain: Not on file  Food Insecurity: Not on file  Transportation Needs: Not on file  Physical Activity: Not on file  Stress: Not on file  Social Connections: Not on file  Intimate Partner Violence: Not on file    Outpatient Medications Prior to Visit  Medication Sig Dispense Refill   albuterol  (VENTOLIN  HFA) 108 (90 Base) MCG/ACT inhaler Inhale 2 puffs into the lungs every 6 (six) hours as needed for wheezing or shortness of breath. 18 g 4   dicyclomine  (BENTYL ) 20 MG tablet Take 1 tablet (20 mg total) by mouth 3 (three) times daily as needed for spasms (abdominal cramping). 20 tablet 0   esomeprazole  (NEXIUM ) 40 MG capsule Take 1 capsule (40 mg total) by mouth daily. 90 capsule 1   hydrocortisone  (ANUSOL -HC) 25 MG suppository Place 1 suppository (25 mg total) rectally 2 (two) times daily as needed for hemorrhoids or anal itching. 12 suppository 0   LORazepam  (ATIVAN ) 1 MG tablet TAKE ONE TABLET BY MOUTH EVERY 8 HOURS AS NEEDED FOR ANXIETY 90 tablet 2   pravastatin  (PRAVACHOL ) 40 MG tablet Take 1 tablet (40 mg total) by mouth daily. 30 tablet 3   Tiotropium Bromide Monohydrate  (SPIRIVA  RESPIMAT) 2.5 MCG/ACT AERS Inhale 2 puffs into the lungs daily. 4 g 5   amLODipine  (NORVASC ) 10 MG tablet Take 1 tablet (10 mg total) by mouth daily. 90 tablet 1   citalopram  (CELEXA ) 10 MG tablet TAKE ONE TABLET BY MOUTH EVERY DAY 30 tablet 3   telmisartan  (MICARDIS ) 20 MG tablet Take 1 tablet (20 mg total) by mouth daily. 30 tablet 0   No facility-administered medications prior to visit.    Allergies  Allergen Reactions   Iohexol Anaphylaxis        Vilazodone Hcl Shortness Of Breath and Itching   Celery Oil Hives and Itching   Gabapentin Hives, Nausea And Vomiting and Swelling   Methadone  Swelling    ROS Review of Systems  Constitutional:  Positive for  fatigue. Negative for chills and fever.  HENT:  Negative for congestion, sinus pressure, sinus pain and sore throat.   Eyes:  Negative for pain and discharge.  Respiratory:  Positive for cough and shortness of breath (Intermittent).   Cardiovascular:  Negative for chest pain and palpitations.  Gastrointestinal:  Negative for abdominal pain, diarrhea, nausea and vomiting.  Endocrine: Negative for polydipsia and  polyuria.  Genitourinary:  Negative for dysuria, hematuria and vaginal discharge.       Perineal area itching  Musculoskeletal:  Positive for back pain. Negative for neck pain and neck stiffness.  Skin:  Negative for rash.  Neurological:  Positive for weakness (B/l LE). Negative for dizziness.  Psychiatric/Behavioral:  Positive for decreased concentration, dysphoric mood and sleep disturbance. Negative for agitation and behavioral problems. The patient is nervous/anxious.       Objective:    Physical Exam Vitals reviewed.  Constitutional:      General: She is not in acute distress.    Appearance: She is not diaphoretic.  HENT:     Head: Normocephalic and atraumatic.     Nose: Nose normal.     Mouth/Throat:     Mouth: Mucous membranes are moist.   Eyes:     General: No scleral icterus.    Extraocular Movements: Extraocular movements intact.    Cardiovascular:     Rate and Rhythm: Normal rate and regular rhythm.     Heart sounds: Normal heart sounds. No murmur heard. Pulmonary:     Breath sounds: Normal breath sounds. No wheezing or rales.   Musculoskeletal:     Cervical back: Neck supple. No tenderness.     Right lower leg: No edema.     Left lower leg: No edema.   Skin:    General: Skin is warm.     Findings: No rash.   Neurological:     General: No focal deficit present.     Mental Status: She is alert and oriented to person, place, and time.   Psychiatric:        Mood and Affect: Mood is anxious. Affect is labile.        Behavior: Behavior is agitated.      BP 130/72 (BP Location: Left Arm)   Pulse 85   Ht 5' 7 (1.702 m)   Wt 186 lb 6.4 oz (84.6 kg)   SpO2 96%   BMI 29.19 kg/m  Wt Readings from Last 3 Encounters:  05/18/24 186 lb 6.4 oz (84.6 kg)  10/22/23 222 lb (100.7 kg)  09/22/23 220 lb 6.4 oz (100 kg)    Lab Results  Component Value Date   TSH 0.283 (L) 11/10/2013   Lab Results  Component Value Date   WBC 7.4 09/22/2023   HGB 13.7 09/22/2023   HCT 41.5 09/22/2023   MCV 94 09/22/2023   PLT 322 09/22/2023   Lab Results  Component Value Date   NA 144 09/22/2023   K 4.5 09/22/2023   CO2 24 09/22/2023   GLUCOSE 94 09/22/2023   BUN 12 09/22/2023   CREATININE 0.83 09/22/2023   BILITOT <0.2 09/22/2023   ALKPHOS 151 (H) 09/22/2023   AST 15 09/22/2023   ALT 13 09/22/2023   PROT 7.6 09/22/2023   ALBUMIN 4.3 09/22/2023   CALCIUM  9.4 09/22/2023   ANIONGAP 5 08/02/2021   EGFR 80 09/22/2023   Lab Results  Component Value Date   CHOL 236 (H) 09/22/2023   Lab Results  Component Value Date   HDL 51 09/22/2023   Lab Results  Component Value Date   LDLCALC 157 (H) 09/22/2023   Lab Results  Component Value Date   TRIG 153 (H) 09/22/2023   Lab Results  Component Value Date   CHOLHDL 4.6 (H) 09/22/2023   Lab Results  Component Value Date   HGBA1C 5.6 09/22/2023      Assessment & Plan:  Problem List Items Addressed This Visit       Cardiovascular and Mediastinum   Essential hypertension - Primary   BP Readings from Last 1 Encounters:  05/18/24 130/72   Uncontrolled with amlodipine  10 mg once daily now DC telmisartan  20 mg once daily as she has not been taking it Switched to Azor 10-20 mg once daily instead of amlodipine  Counseled for compliance with the medications Advised DASH diet and moderate exercise/walking, at least 150 mins/week       Relevant Medications   amlodipine -olmesartan (AZOR) 10-20 MG tablet     Respiratory   Chronic obstructive pulmonary disease (HCC)   Well-controlled  with Spiriva  and and as needed albuterol  Needs to cut down -> quit smoking        Nervous and Auditory   Lumbar radiculopathy   Has chronic low back pain with LE weakness Used to take oral opioids and also has tried fentanyl  patch Has stopped taking Lyrica as it was not helping Referred to [facility] pain clinic for pain management, but did not follow up      Relevant Medications   citalopram  (CELEXA ) 10 MG tablet     Genitourinary   Acute vaginitis   Likely due to candidal vaginitis, sent fluconazole  Check vaginal swab      Relevant Medications   fluconazole  (DIFLUCAN ) 150 MG tablet   Other Relevant Orders   NuSwab Vaginitis Plus (VG+)     Other   Panic disorder (Chronic)   Used to see psychiatry in the past Was on Ativan  1 mg 3 times daily, was managed by previous PCP Was uncontrolled with Ativan  1 mg twice daily -increased dose of Ativan  to 1 mg 3 times daily On Celexa  10 mg QD Strictly advised to avoid any substance abuse including opioid medication that are not prescription medicine for her      Relevant Medications   citalopram  (CELEXA ) 10 MG tablet   Other Relevant Orders   ToxASSURE Select 13 (MW), Urine   Hyperlipidemia   Has tried pravastatin , but had myalgias Checked lipid profile - but she denies to take any other statins as she has had myalgias      Relevant Medications   amlodipine -olmesartan (AZOR) 10-20 MG tablet   Tobacco abuse   Smokes about 0.5 pack/day  Asked about quitting: confirms that he/she currently smokes cigarettes Advise to quit smoking: Educated about QUITTING to reduce the risk of cancer, cardio and cerebrovascular disease. Assess willingness: Unwilling to quit at this time, but is working on cutting back. Assist with counseling and pharmacotherapy: Counseled for 5 minutes and literature provided. Arrange for follow up: follow up in 3 months and continue to offer help.      Generalized anxiety disorder   Uncontrolled due to  recent stressors, had car breakdown related expenses On Ativan  1 mg TID On Celexa       Relevant Medications   citalopram  (CELEXA ) 10 MG tablet   Moderate episode of recurrent major depressive disorder (HCC)   Overall well-controlled Has history of medication noncompliance in the past On Celexa  10 mg QD      Relevant Medications   citalopram  (CELEXA ) 10 MG tablet   Other Visit Diagnoses       Chronic prescription benzodiazepine use       Relevant Orders   ToxASSURE Select 13 (MW), Urine       Meds ordered this encounter  Medications   amlodipine -olmesartan (AZOR) 10-20 MG tablet    Sig: Take 1 tablet  by mouth daily.    Dispense:  90 tablet    Refill:  1    Please discontinue Amlodipine  10 mg.   fluconazole  (DIFLUCAN ) 150 MG tablet    Sig: Take 1 tablet (150 mg total) by mouth once for 1 dose.    Dispense:  1 tablet    Refill:  0   citalopram  (CELEXA ) 10 MG tablet    Sig: Take 1 tablet (10 mg total) by mouth daily.    Dispense:  30 tablet    Refill:  5    Follow-up: Return in about 2 months (around 07/18/2024).    Meldon Sport, MD

## 2024-05-18 NOTE — Assessment & Plan Note (Signed)
 Has tried pravastatin, but had myalgias Checked lipid profile - but she denies to take any other statins as she has had myalgias

## 2024-05-19 NOTE — Telephone Encounter (Signed)
 Results are not back yet, will call patient once in

## 2024-05-19 NOTE — Telephone Encounter (Unsigned)
 Copied from CRM [id number]. Topic: Clinical - Lab/Test Results >> May 19, 2024  9:59 AM Tiffini S wrote: Reason for CRM: Juanita called for a update on her lab results from 05/18/24. Please call her at [phone] to discuss results.

## 2024-05-20 ENCOUNTER — Ambulatory Visit: Payer: Self-pay | Admitting: Internal Medicine

## 2024-05-20 ENCOUNTER — Other Ambulatory Visit: Payer: Self-pay | Admitting: Internal Medicine

## 2024-05-20 DIAGNOSIS — N760 Acute vaginitis: Secondary | ICD-10-CM

## 2024-05-20 LAB — NUSWAB VAGINITIS PLUS (VG+)

## 2024-05-20 MED ORDER — METRONIDAZOLE 500 MG PO TABS: 500.0000 mg | ORAL_TABLET | Freq: Two times a day (BID) | ORAL | 0 refills | Status: AC

## 2024-05-22 LAB — NUSWAB VAGINITIS PLUS (VG+)
Atopobium vaginae: HIGH {score} — AB
Candida albicans, NAA: NEGATIVE
Candida glabrata, NAA: NEGATIVE

## 2024-05-26 ENCOUNTER — Telehealth: Payer: Self-pay

## 2024-05-26 LAB — TOXASSURE SELECT 13 (MW), URINE

## 2024-05-26 NOTE — Telephone Encounter (Signed)
 Spoke to pt , appt scheduled 07/17

## 2024-05-26 NOTE — Telephone Encounter (Signed)
 Copied from CRM [id number]. Topic: General - Other >> May 26, 2024  9:16 AM Emylou G wrote: Reason for CRM: pls call patient.. has question on med.. BUT I did make f/u appt.. is that date okay.. it was the earliest?

## 2024-06-06 ENCOUNTER — Other Ambulatory Visit: Payer: Self-pay | Admitting: Internal Medicine

## 2024-06-06 DIAGNOSIS — K219 Gastro-esophageal reflux disease without esophagitis: Secondary | ICD-10-CM

## 2024-06-06 MED ORDER — ESOMEPRAZOLE MAGNESIUM 40 MG PO CPDR: 40.0000 mg | DELAYED_RELEASE_CAPSULE | Freq: Every day | ORAL | 1 refills | Status: AC

## 2024-06-06 NOTE — Telephone Encounter (Signed)
 Copied from CRM [id number]. Topic: Clinical - Medication Refill >> Jun 06, 2024 11:42 AM Zebedee SAUNDERS wrote: Medication: esomeprazole  (NEXIUM ) 40 MG capsule  Has the patient contacted their pharmacy? Yes (Agent: If no, request that the patient contact the pharmacy for the refill. If patient does not wish to contact the pharmacy document the reason why and proceed with request.) (Agent: If yes, when and what did the pharmacy advise?)  This is the patient's preferred pharmacy:  [facility] - [place], [place] - [address] [place] [address] Phone: [phone] Fax: [phone]  Is this the correct pharmacy for this prescription? Yes If no, delete pharmacy and type the correct one.   Has the prescription been filled recently? Yes  Is the patient out of the medication? Yes  Has the patient been seen for an appointment in the last year OR does the patient have an upcoming appointment? Yes  Can we respond through MyChart? Yes  Agent: Please be advised that Rx refills may take up to 3 business days. We ask that you follow-up with your pharmacy.

## 2024-06-16 ENCOUNTER — Ambulatory Visit: Payer: Self-pay | Admitting: Internal Medicine

## 2024-06-16 ENCOUNTER — Encounter: Payer: Self-pay | Admitting: Internal Medicine

## 2024-06-20 ENCOUNTER — Telehealth: Payer: Self-pay

## 2024-06-20 ENCOUNTER — Encounter: Payer: Self-pay | Admitting: Internal Medicine

## 2024-06-20 NOTE — Telephone Encounter (Signed)
Spoke to pt let her know. 

## 2024-06-20 NOTE — Telephone Encounter (Signed)
 Copied from CRM [id number]. Topic: Clinical - Medication Question >> Jun 20, 2024  9:51 AM Chiquita SQUIBB wrote: Reason for CRM: Patient is calling in stating that the amlodipine -olmesartan  (AZOR ) 10-20 MG tablet [[id number]] is making her dizzy and asking if she can be switched to something else, or re take her old medication.

## 2024-07-05 ENCOUNTER — Telehealth: Payer: Self-pay

## 2024-07-05 NOTE — Telephone Encounter (Signed)
 Patient dismissal is set for 07/16/24. Per policy, patient can continue to receive acute care.

## 2024-07-05 NOTE — Telephone Encounter (Signed)
 Copied from CRM #[id number]. Topic: General - Other >> Jul 05, 2024 12:48 PM Cleave MATSU wrote: Reason for CRM: pt is still wanting to be seen here I was trying to schedule her an appt but I see she is dismissed from the practice. Is there any way possible you guys can reverse the decision and get her scheduled to come back? Please follow up with pt in regards to this

## 2024-07-31 ENCOUNTER — Other Ambulatory Visit: Payer: Self-pay | Admitting: Internal Medicine

## 2024-07-31 DIAGNOSIS — F410 Panic disorder [episodic paroxysmal anxiety] without agoraphobia: Secondary | ICD-10-CM

## 2024-08-09 ENCOUNTER — Other Ambulatory Visit: Payer: Self-pay | Admitting: Internal Medicine

## 2024-08-09 DIAGNOSIS — F410 Panic disorder [episodic paroxysmal anxiety] without agoraphobia: Secondary | ICD-10-CM

## 2024-08-09 NOTE — Telephone Encounter (Signed)
 Copied from CRM [id number]. Topic: Clinical - Medication Refill >> Aug 09, 2024 10:46 AM Berwyn MATSU wrote: Medication: LORazepam  (ATIVAN ) 1 MG tablet; patient is requesting a refill until she sees her new PCP as she transition from being dismissed.   Has the patient contacted their pharmacy? Yes (Agent: If no, request that the patient contact the pharmacy for the refill. If patient does not wish to contact the pharmacy document the reason why and proceed with request.) (Agent: If yes, when and what did the pharmacy advise?)  This is the patient's preferred pharmacy:  [facility] - [place], [place] - [address] [place] [address] Phone: [phone] Fax: [phone]  Is this the correct pharmacy for this prescription? Yes If no, delete pharmacy and type the correct one.   Has the prescription been filled recently? Yes  Is the patient out of the medication? Yes  Has the patient been seen for an appointment in the last year OR does the patient have an upcoming appointment? Yes  Can we respond through MyChart? No  Agent: Please be advised that Rx refills may take up to 3 business days. We ask that you follow-up with your pharmacy.

## 2024-08-18 ENCOUNTER — Ambulatory Visit: Admitting: Internal Medicine

## 2024-10-25 ENCOUNTER — Other Ambulatory Visit (HOSPITAL_COMMUNITY): Payer: Self-pay | Admitting: Internal Medicine

## 2024-10-25 DIAGNOSIS — Z1231 Encounter for screening mammogram for malignant neoplasm of breast: Secondary | ICD-10-CM

## 2024-11-29 ENCOUNTER — Ambulatory Visit (HOSPITAL_COMMUNITY)
Admission: RE | Admit: 2024-11-29 | Discharge: 2024-11-29 | Disposition: A | Discharge status: Home / Self Care | Source: Ambulatory Visit | Attending: Gerontology | Admitting: Gerontology

## 2024-11-29 ENCOUNTER — Other Ambulatory Visit (HOSPITAL_COMMUNITY): Payer: Self-pay | Admitting: Gerontology

## 2024-11-29 DIAGNOSIS — R059 Cough, unspecified: Secondary | ICD-10-CM

## 2024-12-14 ENCOUNTER — Ambulatory Visit (HOSPITAL_COMMUNITY)

## 2024-12-22 NOTE — Progress Notes (Signed)
 Susan Walker                                          MRN: [id number]   12/22/2024   The [facility] Quality Team Specialist reviewed this patient medical record for the purposes of chart review for care gap closure. The following were reviewed: chart review for care gap closure-breast cancer screening.    [facility] Quality Team
